# Patient Record
Sex: Female | Born: 1950 | Race: White | Hispanic: No | Marital: Married | Smoking: Never smoker
Health system: Southern US, Community
[De-identification: ages and names within clinical notes are randomized; demographics above are authoritative.]

## PROBLEM LIST (undated history)

## (undated) DIAGNOSIS — E785 Hyperlipidemia, unspecified: Secondary | ICD-10-CM

## (undated) DIAGNOSIS — N200 Calculus of kidney: Secondary | ICD-10-CM

## (undated) DIAGNOSIS — E119 Type 2 diabetes mellitus without complications: Secondary | ICD-10-CM

## (undated) DIAGNOSIS — I739 Peripheral vascular disease, unspecified: Secondary | ICD-10-CM

## (undated) DIAGNOSIS — I2510 Atherosclerotic heart disease of native coronary artery without angina pectoris: Secondary | ICD-10-CM

## (undated) DIAGNOSIS — I10 Essential (primary) hypertension: Secondary | ICD-10-CM

## (undated) DIAGNOSIS — I779 Disorder of arteries and arterioles, unspecified: Secondary | ICD-10-CM

## (undated) DIAGNOSIS — Z87898 Personal history of other specified conditions: Secondary | ICD-10-CM

## (undated) HISTORY — DX: Peripheral vascular disease, unspecified: I73.9

## (undated) HISTORY — DX: Personal history of other specified conditions: Z87.898

## (undated) HISTORY — DX: Hyperlipidemia, unspecified: E78.5

## (undated) HISTORY — DX: Atherosclerotic heart disease of native coronary artery without angina pectoris: I25.10

## (undated) HISTORY — DX: Disorder of arteries and arterioles, unspecified: I77.9

## (undated) HISTORY — PX: CHOLECYSTECTOMY: SHX55

## (undated) HISTORY — DX: Essential (primary) hypertension: I10

## (undated) HISTORY — DX: Calculus of kidney: N20.0

## (undated) HISTORY — PX: BREAST CYST ASPIRATION: SHX578

## (undated) HISTORY — PX: TONSILLECTOMY: SUR1361

---
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
## 2005-02-09 ENCOUNTER — Ambulatory Visit: Payer: Self-pay | Admitting: Family Medicine

## 2005-03-20 ENCOUNTER — Inpatient Hospital Stay (HOSPITAL_COMMUNITY): Admission: AD | Admit: 2005-03-20 | Discharge: 2005-03-29 | Payer: Self-pay | Admitting: *Deleted

## 2005-03-20 HISTORY — PX: CORONARY ARTERY BYPASS GRAFT: SHX141

## 2005-04-30 ENCOUNTER — Encounter: Admission: RE | Admit: 2005-04-30 | Discharge: 2005-04-30 | Payer: Self-pay | Admitting: Cardiothoracic Surgery

## 2005-08-17 ENCOUNTER — Encounter: Payer: Self-pay | Admitting: Unknown Physician Specialty

## 2006-03-04 ENCOUNTER — Ambulatory Visit: Payer: Self-pay | Admitting: Family Medicine

## 2007-03-10 ENCOUNTER — Ambulatory Visit: Payer: Self-pay | Admitting: Family Medicine

## 2007-07-11 ENCOUNTER — Ambulatory Visit: Payer: Self-pay | Admitting: General Surgery

## 2007-09-01 ENCOUNTER — Encounter (INDEPENDENT_AMBULATORY_CARE_PROVIDER_SITE_OTHER): Payer: Self-pay | Admitting: *Deleted

## 2007-09-01 LAB — CONVERTED CEMR LAB
Cholesterol: 161 mg/dL
HDL: 68 mg/dL
LDL (calc): 64 mg/dL
Triglyceride fasting, serum: 144 mg/dL

## 2007-12-26 LAB — HM COLONOSCOPY: HM Colonoscopy: NORMAL

## 2008-03-05 ENCOUNTER — Encounter (INDEPENDENT_AMBULATORY_CARE_PROVIDER_SITE_OTHER): Payer: Self-pay | Admitting: *Deleted

## 2008-03-05 LAB — CONVERTED CEMR LAB
Cholesterol: 148 mg/dL
HDL: 66 mg/dL
LDL (calc): 59 mg/dL
Triglyceride fasting, serum: 117 mg/dL

## 2008-03-13 ENCOUNTER — Ambulatory Visit: Payer: Self-pay | Admitting: Family Medicine

## 2008-07-31 ENCOUNTER — Ambulatory Visit: Payer: Self-pay | Admitting: Family Medicine

## 2008-08-20 ENCOUNTER — Encounter (INDEPENDENT_AMBULATORY_CARE_PROVIDER_SITE_OTHER): Payer: Self-pay | Admitting: *Deleted

## 2008-08-20 LAB — CONVERTED CEMR LAB
Cholesterol: 155 mg/dL
HDL: 74 mg/dL
LDL (calc): 59 mg/dL
Triglyceride fasting, serum: 112 mg/dL

## 2009-03-14 ENCOUNTER — Ambulatory Visit: Payer: Self-pay | Admitting: Family Medicine

## 2009-10-18 ENCOUNTER — Ambulatory Visit: Payer: Self-pay | Admitting: Family Medicine

## 2009-10-30 ENCOUNTER — Encounter (INDEPENDENT_AMBULATORY_CARE_PROVIDER_SITE_OTHER): Payer: Self-pay | Admitting: *Deleted

## 2009-12-19 ENCOUNTER — Ambulatory Visit: Payer: Self-pay | Admitting: Cardiovascular Disease

## 2009-12-19 DIAGNOSIS — I25118 Atherosclerotic heart disease of native coronary artery with other forms of angina pectoris: Secondary | ICD-10-CM

## 2009-12-19 DIAGNOSIS — E785 Hyperlipidemia, unspecified: Secondary | ICD-10-CM | POA: Insufficient documentation

## 2009-12-23 ENCOUNTER — Encounter: Payer: Self-pay | Admitting: Cardiovascular Disease

## 2010-05-07 ENCOUNTER — Ambulatory Visit: Payer: Self-pay | Admitting: Family Medicine

## 2010-08-19 NOTE — Assessment & Plan Note (Signed)
Summary: NP6/AMD   Visit Type:  Initial Consult Primary Provider:  Lorie Phenix  CC:  no complaints.  History of Present Illness: Dana Ramirez is a very pleasant 60 year old woman with a history of coronary artery disease, bypass surgery in September 2006 with a LIMA to the LAD, vein graft to the diagonal, negative stress test in December 2009, moderate left carotid arterial disease,  who presented to establish care. She was last seen by myself at [facility] every 2010.  She states that she had a stress test and carotid ultrasound done in 2010 and will try to obtain these records. Will also try to obtain the most recent lipid panel from Dr. Elease Hashimoto.  She states that she is doing well, has no symptoms of shortness of breath or chest discomfort. She is trying to work on her weight and her diabetes. Her last hemoglobin A1c per her report was 6.75. She has been taking Crestor 20 mg alternating with 10 mg.  Cardiac catheterization done September 2006 showed a diffusely diseased LAD from the left main to the midportion with lesion up to 90-95%, 95% ostial diagonal disease.  Preventive Screening-Counseling & Management  Alcohol-Tobacco     Smoking Status: never  Caffeine-Diet-Exercise     Does Patient Exercise: yes      Drug Use:  no.    Current Medications (verified): 1)  Crestor 20 Mg Tabs (Rosuvastatin Calcium) .... Take One Tablet By Mouth Every Other Day Alternated One Half Tablet Every Other Day 2)  Aspir-Low 81 Mg Tbec (Aspirin) .... Take 2 Tablet By Mouth Once Daily 3)  Multivitamins  Tabs (Multiple Vitamin) 4)  Metoprolol Succinate 25 Mg Xr24h-Tab (Metoprolol Succinate) .... Take 1 Tablet By Mouth Once Daily 5)  Cozaar 50 Mg Tabs (Losartan Potassium) .... Take One Tablet By Mouth Daily  Allergies (verified): 1)  ! Codeine 2)  ! Pcn  Social History: Full Time--[employer] Married  Tobacco Use - No.  Alcohol Use - no Regular Exercise - yes--once or twice q  week Drug Use - no Smoking Status:  never Does Patient Exercise:  yes Drug Use:  no  Review of Systems       The patient complains of weight gain.  The patient denies fever, weight loss, vision loss, decreased hearing, hoarseness, chest pain, syncope, dyspnea on exertion, peripheral edema, prolonged cough, abdominal pain, incontinence, muscle weakness, depression, and enlarged lymph nodes.    Vital Signs:  Patient profile:   60 year old female Height:      62 inches Weight:      176 pounds BMI:     32.31 Pulse rate:   76 / minute BP sitting:   118 / 81  (right arm) Cuff size:   regular  Vitals Entered By: Bishop Dublin, CMA (December 19, 2009 2:41 PM)  Physical Exam  General:  Well developed, well nourished, in no acute distress. Head:  normocephalic and atraumatic Neck:  Neck supple, no JVD. No masses, thyromegaly or abnormal cervical nodes. Lungs:  Clear bilaterally to auscultation and percussion. Heart:  Non-displaced PMI, chest non-tender; regular rate and rhythm, S1, S2 without murmurs, rubs or gallops. Carotid upstroke normal, no bruit. . Pedals normal pulses. No edema, no varicosities. Abdomen:  Bowel sounds positive; abdomen soft and non-tender without masses, organomegaly, or hernias noted. No hepatosplenomegaly. Msk:  Back normal, normal gait. Muscle strength and tone normal. Pulses:  pulses normal in all 4 extremities Extremities:  No clubbing or cyanosis. Neurologic:  Alert and oriented x 3. Skin:  Intact without lesions or rashes. Psych:  Normal affect.    EKG  Procedure date:  12/19/2009  Findings:      normal sinus rhythm with rate 75 beats per minute, no significant ST or T wave changes.  Impression & Recommendations:  Problem # 1:  CAD, NATIVE VESSEL (ICD-414.01) history of bypass in 2006. We'll try to obtain the most recent stress test from [facility] for our records. No symptoms of angina.  The following medications were removed from the medication  list:    Nitrostat 0.4 Mg Subl (Nitroglycerin) .Marland Kitchen... Take 1 tablet by mouth as needed Her updated medication list for this problem includes:    Aspir-low 81 Mg Tbec (Aspirin) .Marland Kitchen... Take 2 tablet by mouth once daily    Metoprolol Succinate 25 Mg Xr24h-tab (Metoprolol succinate) .Marland Kitchen... Take 1 tablet by mouth once daily  Problem # 2:  HYPERLIPIDEMIA-MIXED (ICD-272.4) We will try to obtain the most recent lipid panel from Dr. Elease Hashimoto  Before making any recommendations. We have encouraged her to continue to loose weight as this will help her lipid panel.  The following medications were removed from the medication list:    Lovaza 1 Gm Caps (Omega-3-acid ethyl esters) .Marland Kitchen... Take 2  tablet by mouth once daily Her updated medication list for this problem includes:    Crestor 20 Mg Tabs (Rosuvastatin calcium) .Marland Kitchen... Take one tablet by mouth every other day alternated one half tablet every other day  Problem # 3:  CAROTID ARTERY STENOSIS, WITHOUT INFARCTION (ICD-433.10) she does have a history of moderate left carotid arterial disease. We'll try to obtain a copy of the most recent carotid ultrasound done last year.  Her updated medication list for this problem includes:    Aspir-low 81 Mg Tbec (Aspirin) .Marland Kitchen... Take 2 tablet by mouth once daily  Patient Instructions: 1)  Your physician has recommended you make the following change in your medication: Increase aspirin 81mg  to 2 tablets a day  2)  Your physician wants you to follow-up in:   6 MONTHS You will receive a reminder letter in the mail two months in advance. If you don't receive a letter, please call our office to schedule the follow-up appointment.

## 2010-08-19 NOTE — Letter (Signed)
Summary: MR RELEASE  MR RELEASE   Imported By: Frazier Butt Chriscoe 12/23/2009 09:49:32  _____________________________________________________________________  External Attachment:    Type:   Image     Comment:   External Document

## 2010-08-19 NOTE — Letter (Signed)
Summary: HEALTH HX  HEALTH HX   Imported ByFrazier Butt Chriscoe 12/23/2009 09:24:38  _____________________________________________________________________  External Attachment:    Type:   Image     Comment:   External Document

## 2010-08-19 NOTE — Letter (Signed)
Summary: MR RELEASE  MR RELEASE   Imported By: Frazier Butt Chriscoe 12/23/2009 09:46:56  _____________________________________________________________________  External Attachment:    Type:   Image     Comment:   External Document

## 2010-12-05 NOTE — Discharge Summary (Signed)
Dana Ramirez, Dana Ramirez NO.:  [id number]   MEDICAL RECORD NO.:  [id number]          PATIENT TYPE:  INP   LOCATION:  2008                         FACILITY:  [facility]   PHYSICIAN:  Sheliah Plane, MD    DATE OF BIRTH:  [birth year]   DATE OF ADMISSION:  03/20/2005  DATE OF DISCHARGE:  03/29/2005                                 DISCHARGE SUMMARY   HISTORY OF PRESENT ILLNESS:  The patient is a 60 year old female who was  seen in consultation by Dr. Jenne Campus.  She is a patient of Dr. Elease Hashimoto in  [place] and has a history of borderline hypertension, as well as a  history of hyperlipidemia who over the last several months has been  complaining of exertional chest pain.  She noted this primarily with walking  uphill.  She would develop the substernal chest pain, as well as some mild  shortness of breath.  When she would stop, the symptoms would slowly  resolve.  She was subsequently to [facility] where she underwent a  stress echocardiogram interpreted by Dr. Ann Maki which showed septal  ischemia.  She had an EKG suggestive of ischemia, as well.  She was put on  an aspirin and instructed to begin Toprol.  She came to the [facility] for a second opinion with Dr. Jenne Campus.  Dr. Jenne Campus and I  evaluated the patient and he suggested that she undergo an elective cardiac  catheterization and then she was admitted this hospitalization for the  procedure.   ADMISSION MEDICATIONS:  1.  Aspirin 325 mg daily.  2.  Multivitamin daily.   FAMILY HISTORY, SOCIAL HISTORY, REVIEW OF SYSTEMS AND PHYSICAL EXAMINATION:  Please see the history and physical done at the time of admission.   ALLERGIES:  The patient has a sensitivity to PENICILLIN.   HOSPITAL COURSE:  The patient was admitted for cardiac catheterization and  this was performed by Dr. Jenne Campus on 03/20/05.  She was found to have  significant LAD disease with a 60% proximal, a 95% mid and a 50% distal  lesion.  The diagonal had a 95% ostial lesion.  The left main had a 30%  ostial lesion which was dampened with catheter.  The left circumflex was  normal and the right coronary artery was normal.  The patient was  subsequently referred to Dr. Sheliah Plane for surgical opinion.  He  evaluated the patient and studies and recommended proceeding with surgical  revascularization.  Of note, during her preoperative workup she was found to  have a moderate left carotid artery stenosis of 40% to 60% on duplex which  was asymptomatic.   PROCEDURE:  On 03/24/05, the patient was taken to the operating room at which  time she underwent the following procedure:  Off pump coronary artery bypass  grafting x2.  The following grafts were placed:  1.  Left internal mammary artery to the LAD.  2.  Saphenous vein graft to the diagonal coronary artery.  The patient tolerated the procedure well and was taken to the Surgical  Intensive Care unit in stable condition.  POSTOPERATIVE HOSPITAL COURSE:  The patient has done quite well.  She did  have some minor difficulties with postoperative nausea and constipation, but  this resolved with time and she fully resumed a regular diet.  Hemodynamically, she remained quite stable.  She did have a postoperative  anemia, but was transfused and has stabilized nicely.  Her most recent  hemoglobin and hematocrit dated 03/27/05 was 12.6 and 35.7, respectively.  Electrolytes:  BUN and creatinine are all within normal limits with the  exception of a slightly elevated non-fasting glucose.  All routine lines,  monitors and drainage devices have been discontinued in the standard  fashion.  Incisions are healing well with some moderate ecchymosis of the  right thigh, but no evidence of hematoma or infection.  She has increased  her activity commensurate to a level of postoperative convalescence using  routine cardiac rehabilitation Phase 1 modalities.  She remains in normal  sinus  rhythm and her overall status is felt to be quite stable for discharge  on 03/29/05.   DISCHARGE MEDICATIONS:  1.  Lipitor 20 mg daily.  2.  Aspirin 81 mg daily.  3.  Folic acid 1 mg daily.  4.  Lopressor 25 mg b.i.d.  5.  Ultram 50 to 100 mg every four hours-6h. p.r.n. as needed for pain.   DISCHARGE INSTRUCTIONS:  The patient received written instructions in regard  to medications, activity, diet, wound care and follow up.  Follow up  included Dr. Tyrone Sage in 3 weeks, Dr. Jenne Campus in 2 weeks.   FINAL DIAGNOSIS:  1.  Severe coronary artery disease, as described above, now status post      surgical revascularization.  2.  Postoperative anemia, status post transfusion with normal laboratory      parameters currently.  3.  Hypertension.  4.  Hyperlipidemia.  5.  Normal left ventricular function.  6.  Left internal carotid artery moderate stenosis.   CONDITION OF PATIENT AT DISCHARGE:  Stable and improved.      Rowe Clack, P.A.-C.      Sheliah Plane, MD  Electronically Signed    WEG/MEDQ  D:  03/29/2005  T:  03/29/2005  Job:  [id number]   cc:   Sheliah Plane, MD  [address]  [place]  [place] [address]   Darlin Priestly, MD  Fax: [phone]   Ann Maki, Dr.  [facility]   [place]  Fax: [phone]

## 2010-12-05 NOTE — Op Note (Signed)
Dana Ramirez, BATTE NO.:  [id number]   MEDICAL RECORD NO.:  [id number]          PATIENT TYPE:  INP   LOCATION:  2313                         FACILITY:  [facility]   PHYSICIAN:  Sheliah Plane, MD    DATE OF BIRTH:  04-Nov-1950   DATE OF PROCEDURE:  03/24/2005  DATE OF DISCHARGE:                                 OPERATIVE REPORT   PREOPERATIVE DIAGNOSIS:  Coronary occlusive disease with new onset of  angina.   POSTOPERATIVE DIAGNOSIS:  Coronary occlusive disease with new onset angina.   SURGICAL PROCEDURE:  Off pump coronary artery bypass grafting x 2 with left  internal mammary to the left anterior descending coronary artery, reversed  saphenous vein graft to the diagonal coronary artery with right thigh  endovein harvesting.   SURGEON:  Gwenith Daily. Tyrone Sage, M.D.   FIRST ASSISTANT:  Salvatore Decent. Dorris Fetch, M.D.   SECOND ASSISTANT:  Rowe Clack, P.A.-C.   BRIEF HISTORY:  The patient is a 60 year old female with a very strong  positive family history of coronary occlusive disease who presents with new  onset of angina.  He was evaluated by Dr. Lenise Herald including cardiac  catheterization which demonstrated high grade complex LAD diagonal lesion  involving the LAD and first diagonal.  The right coronary artery was free of  disease.  The circumflex was free of disease.  Overall ventricular function  was preserved.  Because of the complex nature of the patient's lesion,  coronary artery bypass grafting was recommended.  The patient agreed and  signed informed consent.   DESCRIPTION OF PROCEDURE:  With Swan-Ganz and arterial line monitors in  place, the patient underwent general endotracheal anesthesia without  incident.  The skin of the chest and legs were prepped with Betadine and  draped in the usual sterile manner.  Using the Guidant endovein harvesting  system, a segment of vein was harvested from the right thigh, was good  quality and caliber.  A median  sternotomy was performed and the left  internal mammary artery was dissected down as a pedicle graft.  The distal  artery was divided and had good free flow.  The vessel was hydrostatically  dilated with heparinized saline.  The pericardium was opened.  Overall  ventricular function appeared preserved.  The patient was systemically  heparinized.  The first diagonal and LAD were both epicardial vessels.  With  the Guidant stabilization system, the apex of the heart was elevated from  the chest slightly.  Vessel loops were placed proximally and distally around  the proximal diagonal vessel to control the vessel.  The second arm of the  stabilizer was also placed.  The vessel was opened and using a running 7-0  Prolene, the distal segment of vein was anastomosed to the first diagonal.  The retractors were then removed and the partial occlusion clamp was placed  on the ascending aorta.  A single punch aortotomy was performed and the vein  graft was anastomosed to the ascending aorta.  Attention was then turned to  the left anterior descending  coronary artery.  In a similar fashion,  this  vessel was bypassed using vessel loops proximally and distally to control  the vessel.  Intraluminal shunts were not used.  Using a running 8-0  Prolene, the left internal mammary artery was anastomosed to the left  anterior descending coronary artery.  The bulldog on the mammary artery was  removed.  The fascia was tacked to the epicardium.  The patient remained  hemodynamically stable throughout this.  Two atrial and two ventricular  pacing wires were applied.  Graft marker was applied.  Protamine sulfate was  administered.  With the operative field hemostatic, the pericardium was  reapproximated.  A left pleural tube, two mediastinal tubes, and a single  anterior mediastinal tube was left in place.  The sternum was closed with #6  stainless steel wire, fascia was closed with interrupted 0 Vicryl, running  3-  0 Vicryl in the subcutaneous tissue, and 4-0 subcuticular stitch in the skin  edges.  Dry dressings were applied.  Sponge and needle counts were reported  as correct at the completion of the procedure.  The patient tolerated the  procedure without obvious complications and was transferred to the surgical  intensive care unit for further postoperative care.      Sheliah Plane, MD  Electronically Signed     EG/MEDQ  D:  03/25/2005  T:  03/25/2005  Job:  [id number]   cc:   Darlin Priestly, MD  Fax: [phone]

## 2010-12-05 NOTE — Cardiovascular Report (Signed)
NAMECHRISHONDA, Dana Ramirez              ACCOUNT NO.:  [id number]   MEDICAL RECORD NO.:  [id number]          PATIENT TYPE:  OIB   LOCATION:  2899                         FACILITY:  [facility]   PHYSICIAN:  Darlin Priestly, MD  DATE OF BIRTH:  1951-05-15   DATE OF PROCEDURE:  03/20/2005  DATE OF DISCHARGE:                              CARDIAC CATHETERIZATION   PROCEDURES:  1.  Left heart catheterization.  2.  Coronary angiography.  3.  Left ventriculogram.   SURGEON:  Darlin Priestly, M.D.   COMPLICATIONS:  None.   INDICATIONS FOR PROCEDURE:  [name] is a 60 year old female patient of Dr.  Lorie Phenix [place], with a history of borderline  hypertension, hyperlipidemia who recently has been complaining of increasing  exertional chest pain.  She underwent a stress echocardiogram  in [facility] in [place], suggesting anteroseptal ischemia with  positive EKG and normal LV function.  She is now referred for cardiac  catheterization to rule out significant CAD.   DESCRIPTION OF PROCEDURE:  After given informed written consent, the patient  was brought to the cardiac catheterization.  Right and left groin was  shaved, prepped and draped in the usual sterile fashion.  ECG monitor was  established.  Using modified Seldinger technique, a #4 French venous sheath  inserted in the right femoral vein and a #6 [place] arterial sheath in the  right femoral artery.  Next, a #5 [place] JL4 3.5 guiding catheter used to  engage the left main.  The left main appeared to be a medium size vessel  with 30% proximal narrowing.  There was pressure damping when engaged with a  5 [place] catheter.   LAD is a medium vessel which coursed the apex.  There were two diagonal  branches.  The LAD is noted to be diffusely diseased from the left main into  the mid portion up to 60% proximal.  This became approximately 90 to 95% in  the distal proximal and early mid portion  of the LAD involving takeoff of  the large diagonal branch.  The mid LAD had mild 50% narrowing but no  further significant disease in the LAD.   The first diagonal is a medium to large size vessel which bifurcates  distally with 95% ostial lesion.  Second diagonal is a medium size vessel  with no significant disease.   Left circumflex was a large vessel coursing the AV groove and gives rise to  three obtuse marginal branches.  The AV groove circumflex has no significant  disease.   First OM is a medium size vessel with no significant disease.  Second and  third OM were medium to large size vessels with no significant disease.   The right coronary artery is a small to medium size vessel which also damps  when engaged with a 6 [place] catheter.  There does not appear to be any  significant disease of the RCA, PDA or posterolateral branch.   Left ventriculogram has a preserved EF of 60%.   HEMODYNAMICS:  Systemic arterial pressure 124/55, LV systolic pressure 123/7.  LVEDP is 25.   CONCLUSION:  1.  Significant one vessel coronary artery disease.  2.  Normal left ventricular systolic function.  3.  Elevated left ventricular end diastolic pressure.      Darlin Priestly, MD  Electronically Signed     RHM/MEDQ  D:  03/20/2005  T:  03/20/2005  Job:  [id number]   cc:   Lorie Phenix  [address]  [place]  [place] [address]  Fax: [phone]

## 2010-12-08 ENCOUNTER — Other Ambulatory Visit: Payer: Self-pay

## 2010-12-08 MED ORDER — ROSUVASTATIN CALCIUM 20 MG PO TABS: ORAL_TABLET | ORAL | Status: DC

## 2010-12-08 NOTE — Telephone Encounter (Signed)
Needs a refill on crestor 20 mg take one tablet by mouth every other day alternated one half tablet every other day.

## 2011-01-01 ENCOUNTER — Encounter: Payer: Self-pay | Admitting: Cardiovascular Disease

## 2011-01-08 ENCOUNTER — Other Ambulatory Visit (INDEPENDENT_AMBULATORY_CARE_PROVIDER_SITE_OTHER): Payer: BC Managed Care – PPO | Admitting: *Deleted

## 2011-01-08 DIAGNOSIS — E785 Hyperlipidemia, unspecified: Secondary | ICD-10-CM

## 2011-01-09 LAB — HEPATIC FUNCTION PANEL
ALT: 31 U/L (ref 0–35)
AST: 31 U/L (ref 0–37)
Albumin: 4.3 g/dL (ref 3.5–5.2)
Alkaline Phosphatase: 72 U/L (ref 39–117)
Bilirubin, Direct: 0.1 mg/dL (ref 0.0–0.3)
Indirect Bilirubin: 0.4 mg/dL (ref 0.0–0.9)
Total Bilirubin: 0.5 mg/dL (ref 0.3–1.2)
Total Protein: 6.2 g/dL (ref 6.0–8.3)

## 2011-01-09 LAB — LIPID PANEL
Cholesterol: 132 mg/dL (ref 0–200)
HDL: 61 mg/dL (ref >39)
LDL Cholesterol: 51 mg/dL (ref 0–99)
Total CHOL/HDL Ratio: 2.2 Ratio
Triglycerides: 98 mg/dL (ref <150)
VLDL: 20 mg/dL (ref 0–40)

## 2011-01-12 ENCOUNTER — Ambulatory Visit: Payer: Self-pay | Admitting: Cardiovascular Disease

## 2011-01-21 ENCOUNTER — Encounter: Payer: Self-pay | Admitting: Cardiovascular Disease

## 2011-01-23 ENCOUNTER — Ambulatory Visit: Payer: Self-pay | Admitting: Cardiovascular Disease

## 2011-01-26 ENCOUNTER — Ambulatory Visit (INDEPENDENT_AMBULATORY_CARE_PROVIDER_SITE_OTHER): Payer: BC Managed Care – PPO | Admitting: Cardiovascular Disease

## 2011-01-26 ENCOUNTER — Ambulatory Visit: Payer: Self-pay | Admitting: Cardiovascular Disease

## 2011-01-26 ENCOUNTER — Encounter: Payer: Self-pay | Admitting: Cardiovascular Disease

## 2011-01-26 DIAGNOSIS — I2510 Atherosclerotic heart disease of native coronary artery without angina pectoris: Secondary | ICD-10-CM

## 2011-01-26 DIAGNOSIS — E785 Hyperlipidemia, unspecified: Secondary | ICD-10-CM

## 2011-01-26 DIAGNOSIS — I6529 Occlusion and stenosis of unspecified carotid artery: Secondary | ICD-10-CM

## 2011-01-26 NOTE — Assessment & Plan Note (Signed)
Cholesterol is at goal on the current lipid regimen. No changes to the medications were made.  

## 2011-01-26 NOTE — Assessment & Plan Note (Signed)
Will repeat carotid ultrasound next year. Cholesterol management is excellent. No bruit on auscultation.

## 2011-01-26 NOTE — Progress Notes (Signed)
Patient ID: Dana Ramirez, female    DOB: 1950-08-29, 60 y.o.   MRN: [id number]  HPI Comments: Ms. Hight is a very pleasant 60 year old woman with a history of coronary artery disease, bypass surgery in September 2006 with a LIMA to the LAD, vein graft to the diagonal, negative stress test in 2010, moderate left carotid arterial disease 2010,  who presents for routine followup.   She reports that she is doing well. [place] She denies any significant chest pain with exertion. Occasionally, she has some chest discomfort after eating, sometimes when she has tomatoes. She thinks this is reflux. She occasionally has burping and relieves her discomfort. Otherwise she feels well with no new complaints. She is trying to work on her weight and her diabetes. Her last hemoglobin A1c per her report was 6.75. She has been taking Crestor 20 mg alternating with 10 mg.   Cardiac catheterization done September 2006 showed a diffusely diseased LAD from the left main to the midportion with lesion up to 90-95%, 95% ostial diagonal disease.   EKG shows normal sinus rhythm with rate 62 beats a minute with no significant ST or T wave changes.   Outpatient Encounter Prescriptions as of 01/26/2011  Medication Sig Dispense Refill  . aspirin (ASPIR-LOW) 81 MG EC tablet Take 162 mg by mouth daily.        . calcium carbonate 200 MG capsule Take 1 capsule by mouth daily.        . Cholecalciferol (VITAMIN D3) 3000 UNITS TABS Take 2,000 Units by mouth daily.        Marland Kitchen losartan (COZAAR) 50 MG tablet Take 50 mg by mouth daily.        . metoprolol succinate (TOPROL-XL) 25 MG 24 hr tablet Take 25 mg by mouth daily.        . Multiple Vitamin (MULTIVITAMIN) tablet Take by mouth.        . rosuvastatin (CRESTOR) 20 MG tablet Take one tablet by mouth every other day alternated one half tablet every other day.  30 tablet  6     Review of Systems  Constitutional: Negative.   HENT:  Negative.   Eyes: Negative.   Respiratory: Negative.   Cardiovascular: Positive for chest pain.  Gastrointestinal: Negative.   Musculoskeletal: Negative.   Skin: Negative.   Neurological: Negative.   Hematological: Negative.   Psychiatric/Behavioral: Negative.   All other systems reviewed and are negative.    BP 132/81  Pulse 62  Ht 5\' 4"  (1.626 m)  Wt 177 lb 1.9 oz (80.341 kg)  BMI 30.40 kg/m2  Physical Exam  Nursing note and vitals reviewed. Constitutional: She is oriented to person, place, and time. She appears well-developed and well-nourished.  HENT:  Head: Normocephalic.  Nose: Nose normal.  Mouth/Throat: Oropharynx is clear and moist.  Eyes: Conjunctivae are normal. Pupils are equal, round, and reactive to light.  Neck: Normal range of motion. Neck supple. No JVD present.  Cardiovascular: Normal rate, regular rhythm, S1 normal, S2 normal, normal heart sounds and intact distal pulses.  Exam reveals no gallop and no friction rub.   No murmur heard. Pulmonary/Chest: Effort normal and breath sounds normal. No respiratory distress. She has no wheezes. She has no rales. She exhibits no tenderness.  Abdominal: Soft. Bowel sounds are normal. She exhibits no distension. There is no tenderness.  Musculoskeletal: Normal range of motion. She exhibits no edema and no tenderness.  Lymphadenopathy:  She has no cervical adenopathy.  Neurological: She is alert and oriented to person, place, and time. Coordination normal.  Skin: Skin is warm and dry. No rash noted. No erythema.  Psychiatric: She has a normal mood and affect. Her behavior is normal. Judgment and thought content normal.         Assessment and Plan

## 2011-01-26 NOTE — Assessment & Plan Note (Signed)
Currently with no symptoms of angina. No further workup at this time. Continue current medication regimen. 

## 2011-01-26 NOTE — Patient Instructions (Signed)
You are doing well. No medication changes were made. Please call us if you have new issues that need to be addressed before your next appt.  We will call you for a follow up Appt. In 6 months  

## 2011-03-04 ENCOUNTER — Telehealth: Payer: Self-pay

## 2011-03-04 MED ORDER — METOPROLOL SUCCINATE ER 25 MG PO TB24: 25.0000 mg | ORAL_TABLET | Freq: Every day | ORAL | Status: DC

## 2011-03-04 MED ORDER — LOSARTAN POTASSIUM 50 MG PO TABS: 50.0000 mg | ORAL_TABLET | Freq: Every day | ORAL | Status: DC

## 2011-03-04 NOTE — Telephone Encounter (Signed)
Requested a refill for losartan potassium 50 mg take one tablet daily.

## 2011-03-04 NOTE — Telephone Encounter (Signed)
Needs a refill for metoprolol.

## 2011-04-14 ENCOUNTER — Telehealth: Payer: Self-pay

## 2011-04-14 MED ORDER — LOSARTAN POTASSIUM 50 MG PO TABS: 50.0000 mg | ORAL_TABLET | Freq: Every day | ORAL | Status: DC

## 2011-04-14 MED ORDER — METOPROLOL SUCCINATE ER 25 MG PO TB24: 25.0000 mg | ORAL_TABLET | Freq: Every day | ORAL | Status: DC

## 2011-04-14 NOTE — Telephone Encounter (Signed)
Refill metoprolol succ er 25 mg.

## 2011-04-14 NOTE — Telephone Encounter (Signed)
Refill sent for losartan potassium 50 mg take one tablet daily.

## 2011-07-09 ENCOUNTER — Other Ambulatory Visit: Payer: Self-pay | Admitting: Cardiovascular Disease

## 2011-07-09 NOTE — Telephone Encounter (Signed)
Refill sent for crestor.  

## 2011-08-11 ENCOUNTER — Ambulatory Visit: Payer: Self-pay | Admitting: Family Medicine

## 2011-10-15 ENCOUNTER — Other Ambulatory Visit: Payer: Self-pay

## 2011-10-15 MED ORDER — METOPROLOL SUCCINATE ER 25 MG PO TB24: 25.0000 mg | ORAL_TABLET | Freq: Every day | ORAL | Status: DC

## 2011-10-15 MED ORDER — LOSARTAN POTASSIUM 50 MG PO TABS: 50.0000 mg | ORAL_TABLET | Freq: Every day | ORAL | Status: DC

## 2011-10-15 NOTE — Telephone Encounter (Signed)
Refill sent for losartan potassium 50 mg take one tablet daily. 

## 2011-10-15 NOTE — Telephone Encounter (Signed)
Refill sent for metoprolol succ Er 25 mg take one tablet daily.

## 2011-10-20 ENCOUNTER — Ambulatory Visit (INDEPENDENT_AMBULATORY_CARE_PROVIDER_SITE_OTHER): Payer: BC Managed Care – PPO | Admitting: Cardiovascular Disease

## 2011-10-20 ENCOUNTER — Encounter: Payer: Self-pay | Admitting: Cardiovascular Disease

## 2011-10-20 VITALS — BP 110/70 | HR 62 | Ht 63.0 in | Wt 161.0 lb

## 2011-10-20 DIAGNOSIS — I2510 Atherosclerotic heart disease of native coronary artery without angina pectoris: Secondary | ICD-10-CM

## 2011-10-20 DIAGNOSIS — E119 Type 2 diabetes mellitus without complications: Secondary | ICD-10-CM

## 2011-10-20 DIAGNOSIS — I6529 Occlusion and stenosis of unspecified carotid artery: Secondary | ICD-10-CM

## 2011-10-20 DIAGNOSIS — E785 Hyperlipidemia, unspecified: Secondary | ICD-10-CM

## 2011-10-20 MED ORDER — METFORMIN HCL 500 MG PO TABS: 500.0000 mg | ORAL_TABLET | Freq: Two times a day (BID) | ORAL | Status: DC

## 2011-10-20 NOTE — Assessment & Plan Note (Signed)
Cholesterol is at goal on the current lipid regimen. No changes to the medications were made.  

## 2011-10-20 NOTE — Assessment & Plan Note (Signed)
Moderate carotid disease in 2010. We will schedule her for repeat carotid ultrasound at her convenience in our office in [place]

## 2011-10-20 NOTE — Assessment & Plan Note (Signed)
Currently with no symptoms of angina. No further workup at this time. Continue current medication regimen. 

## 2011-10-20 NOTE — Patient Instructions (Signed)
You are doing well. Please start  Metformin one in the AM. Monitor your sugars if possible  Please call us if you have new issues that need to be addressed before your next appt.  Your physician wants you to follow-up in: 6 months.  You will receive a reminder letter in the mail two months in advance. If you don't receive a letter, please call our office to schedule the follow-up appointment.

## 2011-10-20 NOTE — Assessment & Plan Note (Signed)
Recent hemoglobin A1c 8.3. We will start metformin 500 mg daily. I've asked her to monitor her sugars on a regular basis and if they continue to be elevated, she should increase the metformin to 500 mg twice a day. She has close followup with Dr. Elease Hashimoto in several weeks.

## 2011-10-20 NOTE — Progress Notes (Signed)
Patient ID: Dana Ramirez, female    DOB: Dec 19, 1950, 61 y.o.   MRN: [id number]  HPI Comments: Dana Ramirez is a very pleasant 61 year old woman with a history of coronary artery disease, bypass surgery in September 2006 with a LIMA to the LAD, vein graft to the diagonal, negative stress test in 2010, moderate left carotid arterial disease 2010,  who presents for routine followup.   She reports that she is doing well. She has been active,  denies any significant chest pain with exertion.   she feels well with no new complaints. She is trying to work on her weight and her diabetes. Her last hemoglobin A1c was more than 8. She has been taking Crestor 20 mg alternating with 10 mg.  Her weight is down 16 pounds from her last clinic visit. She has had a very stressful several months. Mother has been sick and had a stroke. The patient has had some family stressors including shingles.   Cardiac catheterization done September 2006 showed a diffusely diseased LAD from the left main to the midportion with lesion up to 90-95%, 95% ostial diagonal disease.   EKG shows normal sinus rhythm with rate 62 beats a minute with no significant ST or T wave changes.   Outpatient Encounter Prescriptions as of 10/20/2011  Medication Sig Dispense Refill  . aspirin (ASPIR-LOW) 81 MG EC tablet Take 162 mg by mouth daily.        . calcium carbonate 200 MG capsule Take 1 capsule by mouth daily.        . Cholecalciferol (VITAMIN D3) 3000 UNITS TABS Take 2,000 Units by mouth daily.        . CRESTOR 20 MG tablet TAKE ONE TABLET BY MOUTH EVERY OTHER DAY ALTERNATED ONE HALF TABLET EVERY OTHER DAY.  30 tablet  6  . losartan (COZAAR) 50 MG tablet Take 1 tablet (50 mg total) by mouth daily.  30 tablet  6  . metoprolol succinate (TOPROL-XL) 25 MG 24 hr tablet Take 1 tablet (25 mg total) by mouth daily.  30 tablet  6  . Multiple Vitamin (MULTIVITAMIN) tablet Take by mouth.          Review of Systems  Constitutional: Negative.     HENT: Negative.   Eyes: Negative.   Respiratory: Negative.   Gastrointestinal: Negative.   Musculoskeletal: Negative.   Skin: Negative.   Neurological: Negative.   Hematological: Negative.   Psychiatric/Behavioral: Negative.   All other systems reviewed and are negative.    BP 110/70  Pulse 62  Ht 5\' 3"  (1.6 m)  Wt 161 lb (73.029 kg)  BMI 28.52 kg/m2  Physical Exam  Nursing note and vitals reviewed. Constitutional: She is oriented to person, place, and time. She appears well-developed and well-nourished.  HENT:  Head: Normocephalic.  Nose: Nose normal.  Mouth/Throat: Oropharynx is clear and moist.  Eyes: Conjunctivae are normal. Pupils are equal, round, and reactive to light.  Neck: Normal range of motion. Neck supple. No JVD present.  Cardiovascular: Normal rate, regular rhythm, S1 normal, S2 normal, normal heart sounds and intact distal pulses.  Exam reveals no gallop and no friction rub.   No murmur heard. Pulmonary/Chest: Effort normal and breath sounds normal. No respiratory distress. She has no wheezes. She has no rales. She exhibits no tenderness.  Abdominal: Soft. Bowel sounds are normal. She exhibits no distension. There is no tenderness.  Musculoskeletal: Normal range of motion. She exhibits no edema and no tenderness.  Lymphadenopathy:  She has no cervical adenopathy.  Neurological: She is alert and oriented to person, place, and time. Coordination normal.  Skin: Skin is warm and dry. No rash noted. No erythema.  Psychiatric: She has a normal mood and affect. Her behavior is normal. Judgment and thought content normal.         Assessment and Plan

## 2011-12-17 ENCOUNTER — Encounter (INDEPENDENT_AMBULATORY_CARE_PROVIDER_SITE_OTHER): Payer: BC Managed Care – PPO

## 2011-12-17 DIAGNOSIS — I6529 Occlusion and stenosis of unspecified carotid artery: Secondary | ICD-10-CM

## 2012-05-23 ENCOUNTER — Other Ambulatory Visit: Payer: Self-pay | Admitting: Cardiovascular Disease

## 2012-05-23 NOTE — Telephone Encounter (Signed)
Refilled Losartan and Metoprolol.

## 2012-06-25 ENCOUNTER — Other Ambulatory Visit: Payer: Self-pay | Admitting: Cardiovascular Disease

## 2012-10-31 ENCOUNTER — Ambulatory Visit: Payer: Self-pay | Admitting: Family Medicine

## 2012-11-12 ENCOUNTER — Other Ambulatory Visit: Payer: Self-pay | Admitting: Cardiovascular Disease

## 2012-11-14 ENCOUNTER — Other Ambulatory Visit: Payer: Self-pay | Admitting: *Deleted

## 2012-11-14 MED ORDER — METFORMIN HCL 500 MG PO TABS: 500.0000 mg | ORAL_TABLET | Freq: Two times a day (BID) | ORAL | Status: DC

## 2012-11-14 NOTE — Telephone Encounter (Signed)
Refilled Metformin sent to [facility].

## 2012-12-21 ENCOUNTER — Encounter (INDEPENDENT_AMBULATORY_CARE_PROVIDER_SITE_OTHER): Payer: BC Managed Care – PPO

## 2012-12-21 ENCOUNTER — Ambulatory Visit (INDEPENDENT_AMBULATORY_CARE_PROVIDER_SITE_OTHER): Payer: BC Managed Care – PPO | Admitting: Cardiovascular Disease

## 2012-12-21 ENCOUNTER — Encounter: Payer: Self-pay | Admitting: Cardiovascular Disease

## 2012-12-21 VITALS — BP 98/68 | HR 70 | Ht 63.0 in | Wt 163.8 lb

## 2012-12-21 DIAGNOSIS — E785 Hyperlipidemia, unspecified: Secondary | ICD-10-CM

## 2012-12-21 DIAGNOSIS — I951 Orthostatic hypotension: Secondary | ICD-10-CM

## 2012-12-21 DIAGNOSIS — I6529 Occlusion and stenosis of unspecified carotid artery: Secondary | ICD-10-CM | POA: Insufficient documentation

## 2012-12-21 DIAGNOSIS — I2510 Atherosclerotic heart disease of native coronary artery without angina pectoris: Secondary | ICD-10-CM

## 2012-12-21 DIAGNOSIS — I6523 Occlusion and stenosis of bilateral carotid arteries: Secondary | ICD-10-CM

## 2012-12-21 DIAGNOSIS — I658 Occlusion and stenosis of other precerebral arteries: Secondary | ICD-10-CM

## 2012-12-21 DIAGNOSIS — E119 Type 2 diabetes mellitus without complications: Secondary | ICD-10-CM

## 2012-12-21 MED ORDER — METOPROLOL SUCCINATE ER 25 MG PO TB24: 25.0000 mg | ORAL_TABLET | Freq: Every day | ORAL | Status: DC

## 2012-12-21 MED ORDER — ROSUVASTATIN CALCIUM 20 MG PO TABS: 20.0000 mg | ORAL_TABLET | Freq: Every day | ORAL | Status: DC

## 2012-12-21 NOTE — Assessment & Plan Note (Signed)
Cholesterol is at goal on the current lipid regimen. No changes to the medications were made.  

## 2012-12-21 NOTE — Assessment & Plan Note (Signed)
Mild carotid arterial disease in 2013. Recheck today. If relatively unchanged, could recheck in several years time.

## 2012-12-21 NOTE — Progress Notes (Signed)
Patient ID: Dana Ramirez, female    DOB: June 29, 1951, 62 y.o.   MRN: [id number]  HPI Comments: Dana Ramirez is a very pleasant 62 year old woman with a history of coronary artery disease, bypass surgery in September 2006 with a LIMA to the LAD, vein graft to the diagonal, negative stress test in 2010, moderate left carotid arterial disease 2010,  who presents for routine followup.   She reports that she is doing well. She has been active,  denies any significant chest pain with exertion.   she feels well  She does report having some lightheaded episodes, particularly with standing . Blood pressure has been running low .  She is trying to work on her weight and her diabetes. She reports her weight has been slowly declining . Her last hemoglobin A1c was 7, down from 8.3  She has been taking Crestor 20 mg alternating with 10 mg.  Most recent total cholesterol 153, LDL 49, HDL 82 in November 2013   Cardiac catheterization done September 2006 showed a diffusely diseased LAD from the left main to the midportion with lesion up to 90-95%, 95% ostial diagonal disease.   EKG shows normal sinus rhythm with rate 70 beats a minute with no significant ST or T wave changes.   Outpatient Encounter Prescriptions as of 12/21/2012  Medication Sig Dispense Refill  . aspirin (ASPIR-LOW) 81 MG EC tablet Take 162 mg by mouth daily.        . calcium carbonate 200 MG capsule Take 1 capsule by mouth daily.        . Cholecalciferol (VITAMIN D3) 3000 UNITS TABS Take 2,000 Units by mouth daily.        Marland Kitchen losartan (COZAAR) 50 MG tablet TAKE 1 TABLET (50 MG TOTAL) BY MOUTH DAILY.  30 tablet  6  . metFORMIN (GLUCOPHAGE) 500 MG tablet Take 500 mg by mouth 3 (three) times daily.      . metoprolol succinate (TOPROL-XL) 25 MG 24 hr tablet Take 1 tablet (25 mg total) by mouth daily.  90 tablet  3  . Multiple Vitamin (MULTIVITAMIN) tablet Take by mouth.        . CRESTOR 20 MG tablet TAKE ONE TABLET BY MOUTH EVERY OTHER DAY  ALTERNATED ONE HALF TABLET EVERY OTHER DAY.  30 tablet  6    Review of Systems  Constitutional: Negative.   HENT: Negative.   Eyes: Negative.   Respiratory: Negative.   Gastrointestinal: Negative.   Musculoskeletal: Negative.   Skin: Negative.   Neurological: Negative.   Psychiatric/Behavioral: Negative.   All other systems reviewed and are negative.    BP 98/68  Pulse 70  Ht 5\' 3"  (1.6 m)  Wt 163 lb 12 oz (74.277 kg)  BMI 29.01 kg/m2 Blood pressure checked by myself 105/70  Physical Exam  Nursing note and vitals reviewed. Constitutional: She is oriented to person, place, and time. She appears well-developed and well-nourished.  HENT:  Head: Normocephalic.  Nose: Nose normal.  Mouth/Throat: Oropharynx is clear and moist.  Eyes: Conjunctivae are normal. Pupils are equal, round, and reactive to light.  Neck: Normal range of motion. Neck supple. No JVD present.  Cardiovascular: Normal rate, regular rhythm, S1 normal, S2 normal, normal heart sounds and intact distal pulses.  Exam reveals no gallop and no friction rub.   No murmur heard. Pulmonary/Chest: Effort normal and breath sounds normal. No respiratory distress. She has no wheezes. She has no rales. She exhibits no tenderness.  Abdominal: Soft. Bowel sounds  are normal. She exhibits no distension. There is no tenderness.  Musculoskeletal: Normal range of motion. She exhibits no edema and no tenderness.  Lymphadenopathy:    She has no cervical adenopathy.  Neurological: She is alert and oriented to person, place, and time. Coordination normal.  Skin: Skin is warm and dry. No rash noted. No erythema.  Psychiatric: She has a normal mood and affect. Her behavior is normal. Judgment and thought content normal.    Assessment and Plan

## 2012-12-21 NOTE — Assessment & Plan Note (Signed)
Currently with no symptoms of angina. No further workup at this time. Continue current medication regimen. 

## 2012-12-21 NOTE — Patient Instructions (Addendum)
Please cut the losartan in 1/2 daily Monitor your blood pressure If it continue to run low, hold the losartan   Please call us if you have new issues that need to be addressed before your next appt.  Your physician wants you to follow-up in: 6 months.  You will receive a reminder letter in the mail two months in advance. If you don't receive a letter, please call our office to schedule the follow-up appointment.

## 2012-12-21 NOTE — Assessment & Plan Note (Signed)
Hemoglobin A1c has improved. We have complemented her on the improvement. Recommended continued slow weight loss, exercise

## 2012-12-21 NOTE — Assessment & Plan Note (Deleted)
Mild carotid disease in 2013.

## 2012-12-21 NOTE — Assessment & Plan Note (Signed)
Low blood pressure today. We have suggested she cut her losartan in half. If blood pressure continues to run low, she could hold the medication. Continue metoprolol

## 2013-01-02 ENCOUNTER — Other Ambulatory Visit: Payer: Self-pay | Admitting: Cardiovascular Disease

## 2013-01-02 NOTE — Telephone Encounter (Signed)
Refilled Losartan and [facility]rolol [facility].

## 2013-05-25 ENCOUNTER — Other Ambulatory Visit: Payer: Self-pay

## 2013-07-26 ENCOUNTER — Ambulatory Visit (INDEPENDENT_AMBULATORY_CARE_PROVIDER_SITE_OTHER): Payer: BC Managed Care – PPO | Admitting: Cardiovascular Disease

## 2013-07-26 ENCOUNTER — Encounter: Payer: Self-pay | Admitting: Cardiovascular Disease

## 2013-07-26 VITALS — BP 100/78 | HR 71 | Ht 63.5 in | Wt 164.8 lb

## 2013-07-26 DIAGNOSIS — I6523 Occlusion and stenosis of bilateral carotid arteries: Secondary | ICD-10-CM

## 2013-07-26 DIAGNOSIS — I2510 Atherosclerotic heart disease of native coronary artery without angina pectoris: Secondary | ICD-10-CM

## 2013-07-26 DIAGNOSIS — E119 Type 2 diabetes mellitus without complications: Secondary | ICD-10-CM

## 2013-07-26 DIAGNOSIS — I658 Occlusion and stenosis of other precerebral arteries: Secondary | ICD-10-CM

## 2013-07-26 DIAGNOSIS — I6529 Occlusion and stenosis of unspecified carotid artery: Secondary | ICD-10-CM

## 2013-07-26 DIAGNOSIS — E785 Hyperlipidemia, unspecified: Secondary | ICD-10-CM

## 2013-07-26 MED ORDER — LOSARTAN POTASSIUM 50 MG PO TABS: 50.0000 mg | ORAL_TABLET | Freq: Every day | ORAL | Status: DC

## 2013-07-26 NOTE — Patient Instructions (Addendum)
You are doing well. No medication changes were made.  Please call us if you have new issues that need to be addressed before your next appt.  Your physician wants you to follow-up in: 6 months.  You will receive a reminder letter in the mail two months in advance. If you don't receive a letter, please call our office to schedule the follow-up appointment.   

## 2013-07-26 NOTE — Assessment & Plan Note (Signed)
Stable carotid disease on most recent study in 2014

## 2013-07-26 NOTE — Assessment & Plan Note (Signed)
Currently with no symptoms of angina. No further workup at this time. Continue current medication regimen. 

## 2013-07-26 NOTE — Assessment & Plan Note (Signed)
We have encouraged continued exercise, careful diet management in an effort to lose weight. 

## 2013-07-26 NOTE — Assessment & Plan Note (Signed)
Cholesterol is at goal on the current lipid regimen. No changes to the medications were made. She will follow up with Dr. Venia Minks for routine blood work

## 2013-07-26 NOTE — Progress Notes (Signed)
   Patient ID: Dana Ramirez, female    DOB: [birth year], 63 y.o.   MRN: [id number]  HPI Comments: Dana Ramirez is a very pleasant 63 year old woman with a history of coronary artery disease, bypass surgery in September 2006 with a LIMA to the LAD, vein graft to the diagonal, negative stress test in 2010, moderate left carotid arterial disease 2010,  who presents for routine followup.   She reports that she is doing well. She has been active,  denies any significant chest pain with exertion.   she feels well  On her last clinic visit, we decreased her losartan. This helped her lightheaded spells She is trying to work on her weight and her diabetes.   She has been taking Crestor 20 mg alternating with 10 mg.  Most recent total cholesterol 153, LDL 49, HDL 82 in November 2013   Cardiac catheterization done September 2006 showed a diffusely diseased LAD from the left main to the midportion with lesion up to 90-95%, 95% ostial diagonal disease.   EKG shows normal sinus rhythm with rate 71 and well beats a minute with no significant ST or T wave changes.   Outpatient Encounter Prescriptions as of 07/26/2013  Medication Sig  . aspirin (ASPIR-LOW) 81 MG EC tablet Take 162 mg by mouth daily.    Marland Kitchen losartan (COZAAR) 50 MG tablet Take 0.5 tablets (25 mg total) by mouth daily.  . metFORMIN (GLUCOPHAGE) 500 MG tablet Take 500 mg by mouth 3 (three) times daily.  . metoprolol succinate (TOPROL-XL) 25 MG 24 hr tablet Take 1 tablet (25 mg total) by mouth daily.  . rosuvastatin (CRESTOR) 20 MG tablet Take 20 mg alt with 10 mg daily.    Review of Systems  Constitutional: Negative.   HENT: Negative.   Eyes: Negative.   Respiratory: Negative.   Cardiovascular: Negative.   Gastrointestinal: Negative.   Endocrine: Negative.   Musculoskeletal: Negative.   Skin: Negative.   Allergic/Immunologic: Negative.   Neurological: Negative.   Hematological: Negative.   Psychiatric/Behavioral: Negative.   All other  systems reviewed and are negative.    BP 100/78  Pulse 71  Ht 5' 3.5" (1.613 m)  Wt 164 lb 12 oz (74.73 kg)  BMI 28.72 kg/m2  Physical Exam  Nursing note and vitals reviewed. Constitutional: She is oriented to person, place, and time. She appears well-developed and well-nourished.  HENT:  Head: Normocephalic.  Nose: Nose normal.  Mouth/Throat: Oropharynx is clear and moist.  Eyes: Conjunctivae are normal. Pupils are equal, round, and reactive to light.  Neck: Normal range of motion. Neck supple. No JVD present.  Cardiovascular: Normal rate, regular rhythm, S1 normal, S2 normal, normal heart sounds and intact distal pulses.  Exam reveals no gallop and no friction rub.   No murmur heard. Pulmonary/Chest: Effort normal and breath sounds normal. No respiratory distress. She has no wheezes. She has no rales. She exhibits no tenderness.  Abdominal: Soft. Bowel sounds are normal. She exhibits no distension. There is no tenderness.  Musculoskeletal: Normal range of motion. She exhibits no edema and no tenderness.  Lymphadenopathy:    She has no cervical adenopathy.  Neurological: She is alert and oriented to person, place, and time. Coordination normal.  Skin: Skin is warm and dry. No rash noted. No erythema.  Psychiatric: She has a normal mood and affect. Her behavior is normal. Judgment and thought content normal.    Assessment and Plan

## 2014-01-18 ENCOUNTER — Other Ambulatory Visit: Payer: Self-pay | Admitting: Cardiovascular Disease

## 2014-01-25 ENCOUNTER — Encounter: Payer: Self-pay | Admitting: Cardiovascular Disease

## 2014-01-25 ENCOUNTER — Ambulatory Visit (INDEPENDENT_AMBULATORY_CARE_PROVIDER_SITE_OTHER): Payer: BC Managed Care – PPO | Admitting: Cardiovascular Disease

## 2014-01-25 VITALS — BP 118/78 | Ht 63.5 in | Wt 162.8 lb

## 2014-01-25 DIAGNOSIS — L237 Allergic contact dermatitis due to plants, except food: Secondary | ICD-10-CM | POA: Insufficient documentation

## 2014-01-25 DIAGNOSIS — I2510 Atherosclerotic heart disease of native coronary artery without angina pectoris: Secondary | ICD-10-CM

## 2014-01-25 DIAGNOSIS — I6522 Occlusion and stenosis of left carotid artery: Secondary | ICD-10-CM

## 2014-01-25 DIAGNOSIS — Z951 Presence of aortocoronary bypass graft: Secondary | ICD-10-CM

## 2014-01-25 DIAGNOSIS — I6529 Occlusion and stenosis of unspecified carotid artery: Secondary | ICD-10-CM

## 2014-01-25 DIAGNOSIS — L255 Unspecified contact dermatitis due to plants, except food: Secondary | ICD-10-CM

## 2014-01-25 DIAGNOSIS — E118 Type 2 diabetes mellitus with unspecified complications: Secondary | ICD-10-CM

## 2014-01-25 DIAGNOSIS — E785 Hyperlipidemia, unspecified: Secondary | ICD-10-CM

## 2014-01-25 MED ORDER — PREDNISONE 20 MG PO TABS: 20.0000 mg | ORAL_TABLET | Freq: Every day | ORAL | Status: DC

## 2014-01-25 NOTE — Assessment & Plan Note (Signed)
Cholesterol is at goal on the current lipid regimen. No changes to the medications were made.  

## 2014-01-25 NOTE — Patient Instructions (Addendum)
You are doing well. No medication changes were made.  Meclizine for vertigo (1/2 or whole)  Prednisone taper for poison ivy: 60mg  once daily for 3 days 40mg  once daily for 3 days 20mg  once daily for 3 days 10mg  once daily for 3 days  We will check labs today  Please call us if you have new issues that need to be addressed before your next appt.  Your physician wants you to follow-up in: 6 months.  You will receive a reminder letter in the mail two months in advance. If you don't receive a letter, please call our office to schedule the follow-up appointment.

## 2014-01-25 NOTE — Assessment & Plan Note (Signed)
40 to 59% disease on the left. Continue aggressive cholesterol management

## 2014-01-25 NOTE — Progress Notes (Signed)
Patient ID: Dana Ramirez, female    DOB: [birth year], 63 y.o.   MRN: [id number]  HPI Comments: Dana Ramirez is a very pleasant 63 year old woman with a history of coronary artery disease, bypass surgery in September 2006 with a LIMA to the LAD, vein graft to the diagonal, negative stress test in 2010, moderate left carotid arterial disease 2010,  who presents for routine followup.  She recently developed severe poison ivy on both lower extremities anterior and posterior aspect of her legs. Also developed vertigo in the past week with episodes of nausea and vomiting She has been active,  denies any significant chest pain with exertion.     Losartan decreased in the past. This helped her lightheaded spells She is trying to work on her weight and her diabetes.  She has been taking Crestor 20 mg alternating with 10 mg.  total cholesterol 153, LDL 49, HDL 82 in November 2013   Cardiac catheterization done September 2006 showed a diffusely diseased LAD from the left main to the midportion with lesion up to 90-95%, 95% ostial diagonal disease.   EKG shows normal sinus rhythm with rate 63 beats per minute with no significant ST or T wave changes.   Outpatient Encounter Prescriptions as of 01/25/2014  Medication Sig  . aspirin (ASPIR-LOW) 81 MG EC tablet Take 162 mg by mouth daily.    Marland Kitchen losartan (COZAAR) 50 MG tablet Take 1 tablet (50 mg total) by mouth daily.  . metFORMIN (GLUCOPHAGE) 500 MG tablet Take 1,000 mg by mouth 2 (two) times daily with a meal.   . metoprolol succinate (TOPROL-XL) 25 MG 24 hr tablet TAKE 1 TABLET BY MOUTH EVERY DAY  . predniSONE (DELTASONE) 20 MG tablet Take 1 tablet (20 mg total) by mouth daily with breakfast.  . rosuvastatin (CRESTOR) 20 MG tablet Take 20 mg alt with 10 mg daily.    Review of Systems  Constitutional: Negative.   HENT: Negative.   Eyes: Negative.   Respiratory: Negative.   Cardiovascular: Negative.   Gastrointestinal: Negative.   Endocrine:  Negative.   Musculoskeletal: Negative.   Skin: Negative.   Allergic/Immunologic: Negative.   Neurological: Negative.   Hematological: Negative.   Psychiatric/Behavioral: Negative.   All other systems reviewed and are negative.   BP 118/78  Ht 5' 3.5" (1.613 m)  Wt 162 lb 12 oz (73.823 kg)  BMI 28.37 kg/m2  Physical Exam  Nursing note and vitals reviewed. Constitutional: She is oriented to person, place, and time. She appears well-developed and well-nourished.  HENT:  Head: Normocephalic.  Nose: Nose normal.  Mouth/Throat: Oropharynx is clear and moist.  Eyes: Conjunctivae are normal. Pupils are equal, round, and reactive to light.  Neck: Normal range of motion. Neck supple. No JVD present.  Cardiovascular: Normal rate, regular rhythm, S1 normal, S2 normal, normal heart sounds and intact distal pulses.  Exam reveals no gallop and no friction rub.   No murmur heard. Pulmonary/Chest: Effort normal and breath sounds normal. No respiratory distress. She has no wheezes. She has no rales. She exhibits no tenderness.  Abdominal: Soft. Bowel sounds are normal. She exhibits no distension. There is no tenderness.  Musculoskeletal: Normal range of motion. She exhibits no edema and no tenderness.  Lymphadenopathy:    She has no cervical adenopathy.  Neurological: She is alert and oriented to person, place, and time. Coordination normal.  Skin: Skin is warm and dry. Rash noted. No erythema.  Psychiatric: She has a normal mood and affect. Her  behavior is normal. Judgment and thought content normal.    Assessment and Plan

## 2014-01-25 NOTE — Assessment & Plan Note (Signed)
Severe poison ivy over the anterior and posterior aspect of her lower extremities. She is very symptomatic. Prednisone taper was offered and stent to the pharmacy

## 2014-01-25 NOTE — Assessment & Plan Note (Signed)
We have encouraged continued exercise, careful diet management in an effort to lose weight. 

## 2014-01-25 NOTE — Assessment & Plan Note (Signed)
Currently with no symptoms of angina. No further workup at this time. Continue current medication regimen. 

## 2014-01-26 LAB — HEPATIC FUNCTION PANEL
ALK PHOS: 75 IU/L (ref 39–117)
ALT: 25 IU/L (ref 0–32)
AST: 22 IU/L (ref 0–40)
Albumin: 4.6 g/dL (ref 3.6–4.8)
Bilirubin, Direct: 0.11 mg/dL (ref 0.00–0.40)
Total Bilirubin: 0.3 mg/dL (ref 0.0–1.2)
Total Protein: 6.5 g/dL (ref 6.0–8.5)

## 2014-01-26 LAB — LIPID PANEL
CHOLESTEROL TOTAL: 132 mg/dL (ref 100–199)
Chol/HDL Ratio: 1.9 ratio units (ref 0.0–4.4)
HDL: 71 mg/dL (ref >39)
LDL Calculated: 42 mg/dL (ref 0–99)
TRIGLYCERIDES: 95 mg/dL (ref 0–149)
VLDL Cholesterol Cal: 19 mg/dL (ref 5–40)

## 2014-01-29 ENCOUNTER — Encounter: Payer: Self-pay | Admitting: Cardiovascular Disease

## 2014-02-02 ENCOUNTER — Telehealth: Payer: Self-pay | Admitting: *Deleted

## 2014-02-02 NOTE — Telephone Encounter (Signed)
Lmom to call our office. Time to schedule carotid doppler. (1 yr f/u)

## 2014-02-06 ENCOUNTER — Other Ambulatory Visit (HOSPITAL_COMMUNITY): Payer: Self-pay | Admitting: *Deleted

## 2014-02-06 DIAGNOSIS — I6529 Occlusion and stenosis of unspecified carotid artery: Secondary | ICD-10-CM

## 2014-02-08 ENCOUNTER — Ambulatory Visit: Payer: Self-pay | Admitting: Family Medicine

## 2014-02-13 ENCOUNTER — Encounter (INDEPENDENT_AMBULATORY_CARE_PROVIDER_SITE_OTHER): Payer: BC Managed Care – PPO

## 2014-02-13 DIAGNOSIS — I6529 Occlusion and stenosis of unspecified carotid artery: Secondary | ICD-10-CM

## 2014-02-15 ENCOUNTER — Other Ambulatory Visit: Payer: Self-pay | Admitting: Cardiovascular Disease

## 2014-05-04 ENCOUNTER — Other Ambulatory Visit: Payer: Self-pay

## 2014-05-30 ENCOUNTER — Other Ambulatory Visit: Payer: Self-pay | Admitting: Cardiovascular Disease

## 2014-07-24 ENCOUNTER — Encounter: Payer: Self-pay | Admitting: Cardiovascular Disease

## 2014-07-24 ENCOUNTER — Ambulatory Visit (INDEPENDENT_AMBULATORY_CARE_PROVIDER_SITE_OTHER): Payer: BC Managed Care – PPO | Admitting: Cardiovascular Disease

## 2014-07-24 VITALS — BP 118/78 | HR 65 | Ht 63.0 in | Wt 155.5 lb

## 2014-07-24 DIAGNOSIS — E785 Hyperlipidemia, unspecified: Secondary | ICD-10-CM

## 2014-07-24 DIAGNOSIS — I2510 Atherosclerotic heart disease of native coronary artery without angina pectoris: Secondary | ICD-10-CM

## 2014-07-24 DIAGNOSIS — I6522 Occlusion and stenosis of left carotid artery: Secondary | ICD-10-CM

## 2014-07-24 DIAGNOSIS — E118 Type 2 diabetes mellitus with unspecified complications: Secondary | ICD-10-CM

## 2014-07-24 NOTE — Assessment & Plan Note (Signed)
Currently with no symptoms of angina. No further workup at this time. Continue current medication regimen. 

## 2014-07-24 NOTE — Progress Notes (Signed)
Patient ID: Dana Ramirez, female    DOB: Oct 27, 1950, 64 y.o.   MRN: [id number]  HPI Comments: Dana Ramirez is a very pleasant 64 year old woman with a history of coronary artery disease, bypass surgery in September 2006 with a LIMA to the LAD, vein graft to the diagonal, negative stress test in 2010, moderate left carotid arterial disease 2010,  who presents for routine followup of her coronary artery disease  In follow-up today, she reports that she is doing well. She is following a low calorie diet, has had weight loss. Blood pressure has been well controlled at home. Recent lab work showing significant improvement in her cholesterol. No regular exercise but she is very active at baseline She has been taking Crestor 20 mg alternating with 10 mg.  Recent lab work showing total cholesterol 134, HDL 69, LDL 46  EKG on today's visit shows normal sinus rhythm with rate 65 bpm, no significant ST or T-wave changes  Other past medical history  prior history of vertigo  Losartan decreased in the past. This helped her lightheaded spells   Cardiac catheterization done September 2006 showed a diffusely diseased LAD from the left main to the midportion with lesion up to 90-95%, 95% ostial diagonal disease.     Allergies  Allergen Reactions  . Codeine   . Penicillins     Outpatient Encounter Prescriptions as of 07/24/2014  Medication Sig  . aspirin (ASPIR-LOW) 81 MG EC tablet Take 162 mg by mouth daily.    Marland Kitchen losartan (COZAAR) 50 MG tablet Take 1 tablet (50 mg total) by mouth daily.  . metFORMIN (GLUCOPHAGE) 500 MG tablet Take 1,000 mg by mouth 2 (two) times daily with a meal.   . metoprolol succinate (TOPROL-XL) 25 MG 24 hr tablet TAKE 1 TABLET BY MOUTH EVERY DAY  . rosuvastatin (CRESTOR) 20 MG tablet Take 20 mg alt with 10 mg daily.  . [DISCONTINUED] CRESTOR 20 MG tablet TAKE 1 TABLET BY MOUTH EVERY DAY (Patient not taking: Reported on 07/24/2014)  . [DISCONTINUED] predniSONE (DELTASONE) 20 MG  tablet Take 1 tablet (20 mg total) by mouth daily with breakfast. (Patient not taking: Reported on 07/24/2014)    Past Medical History  Diagnosis Date  . Carotid arterial disease   . CAD (coronary artery disease)   . Hyperlipidemia   . Hypertension   . History of vertigo     Past Surgical History  Procedure Laterality Date  . 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 Cholecystectomy    . Cesarean section      x 2  . Tonsillectomy      Social History  reports that she has never smoked. She does not have any smokeless tobacco history on file. She reports that she does not drink alcohol or use illicit drugs.  Family History family history includes Coronary artery disease in her other; Heart disease in her father, maternal grandfather, maternal grandmother, mother, paternal grandfather, and paternal grandmother; Stroke in her sister.   Review of Systems  Constitutional: Negative.   HENT: Negative.   Respiratory: Negative.   Cardiovascular: Negative.   Gastrointestinal: Negative.   Endocrine: Negative.   Musculoskeletal: Negative.   Neurological: Negative.   Hematological: Negative.   Psychiatric/Behavioral: Negative.   All other systems reviewed and are negative.   BP 118/78 mmHg  Pulse 65  Ht 5\' 3"  (1.6 m)  Wt 155  lb 8 oz (70.534 kg)  BMI 27.55 kg/m2  Physical Exam  Constitutional: She is oriented to person, place, and time. She appears well-developed and well-nourished.  HENT:  Head: Normocephalic.  Nose: Nose normal.  Mouth/Throat: Oropharynx is clear and moist.  Eyes: Conjunctivae are normal. Pupils are equal, round, and reactive to light.  Neck: Normal range of motion. Neck supple. No JVD present.  Cardiovascular: Normal rate, regular rhythm, S1 normal, S2 normal, normal heart sounds and intact distal pulses.  Exam reveals no gallop and no friction rub.   No murmur  heard. Pulmonary/Chest: Effort normal and breath sounds normal. No respiratory distress. She has no wheezes. She has no rales. She exhibits no tenderness.  Abdominal: Soft. Bowel sounds are normal. She exhibits no distension. There is no tenderness.  Musculoskeletal: Normal range of motion. She exhibits no edema or tenderness.  Lymphadenopathy:    She has no cervical adenopathy.  Neurological: She is alert and oriented to person, place, and time. Coordination normal.  Skin: Skin is warm and dry. No rash noted. No erythema.  Psychiatric: She has a normal mood and affect. Her behavior is normal. Judgment and thought content normal.    Assessment and Plan  Nursing note and vitals reviewed.

## 2014-07-24 NOTE — Assessment & Plan Note (Signed)
Cholesterol is at goal on the current lipid regimen. No changes to the medications were made.  

## 2014-07-24 NOTE — Assessment & Plan Note (Signed)
We have encouraged continued exercise, careful diet management in an effort to lose weight. Recent hemoglobin A1c significant improved, 6.7

## 2014-07-24 NOTE — Patient Instructions (Signed)
You are doing well. No medication changes were made.  Please call us if you have new issues that need to be addressed before your next appt.  Your physician wants you to follow-up in: 6 months.  You will receive a reminder letter in the mail two months in advance. If you don't receive a letter, please call our office to schedule the follow-up appointment.   

## 2014-07-24 NOTE — Assessment & Plan Note (Signed)
40 to 59% disease on the left. Continue aggressive cholesterol management

## 2014-11-26 ENCOUNTER — Ambulatory Visit
Admission: RE | Admit: 2014-11-26 | Discharge: 2014-11-26 | Disposition: A | Discharge status: Home / Self Care | Payer: BC Managed Care – PPO | Source: Ambulatory Visit | Attending: Family Medicine | Admitting: Family Medicine

## 2014-11-26 ENCOUNTER — Other Ambulatory Visit: Payer: Self-pay | Admitting: Family Medicine

## 2014-11-26 DIAGNOSIS — R059 Cough, unspecified: Secondary | ICD-10-CM

## 2014-11-26 DIAGNOSIS — R05 Cough: Secondary | ICD-10-CM

## 2014-12-02 ENCOUNTER — Other Ambulatory Visit: Payer: Self-pay | Admitting: Cardiovascular Disease

## 2014-12-20 ENCOUNTER — Other Ambulatory Visit: Payer: Self-pay | Admitting: Family Medicine

## 2014-12-20 ENCOUNTER — Encounter: Payer: Self-pay | Admitting: Family Medicine

## 2014-12-20 ENCOUNTER — Ambulatory Visit
Admission: RE | Admit: 2014-12-20 | Discharge: 2014-12-20 | Disposition: A | Discharge status: Home / Self Care | Payer: BC Managed Care – PPO | Source: Ambulatory Visit | Attending: Family Medicine | Admitting: Family Medicine

## 2014-12-20 ENCOUNTER — Ambulatory Visit (INDEPENDENT_AMBULATORY_CARE_PROVIDER_SITE_OTHER): Payer: BC Managed Care – PPO | Admitting: Family Medicine

## 2014-12-20 VITALS — BP 102/80 | HR 60 | Temp 98.0°F | Resp 16 | Ht 64.0 in | Wt 155.0 lb

## 2014-12-20 DIAGNOSIS — R319 Hematuria, unspecified: Secondary | ICD-10-CM | POA: Diagnosis not present

## 2014-12-20 DIAGNOSIS — R109 Unspecified abdominal pain: Secondary | ICD-10-CM | POA: Diagnosis not present

## 2014-12-20 LAB — POCT URINALYSIS DIPSTICK
Bilirubin, UA: NEGATIVE
Glucose, UA: 100
Ketones, UA: NEGATIVE
Leukocytes, UA: NEGATIVE
Nitrite, UA: NEGATIVE
Protein, UA: NEGATIVE
Spec Grav, UA: 1.01
UROBILINOGEN UA: NEGATIVE
pH, UA: 7

## 2014-12-20 NOTE — Patient Instructions (Addendum)
Push fluids and use up to 3000 mg./ day for pain. May call for additional pain medication. Strain urine.

## 2014-12-20 NOTE — Progress Notes (Signed)
Subjective:     Patient ID: Dana Ramirez, female   DOB: [birth year], 64 y.o.   MRN: [id number]  Back Pain  States pain has ranged up to 9.5 last night. Reports never having a UTI but states she does not like to drink a lot of fluids.   Review of Systems  Genitourinary: Flank pain: no cva tenderness.  Musculoskeletal: Positive for back pain.       Objective:   Physical Exam  Abdominal: There is tenderness (left lower quadrant; no cva tenderness). There is no guarding.       Assessment:     1. Hematuria  - POCT Urinalysis Dipstick - Urine Culture - DG Abd 1 View; Future  2. Left flank pain  - DG Abd 1 View; Future    Plan:     Screen urine and push fluids Return or call if not improving. Defers narcotic medication-will continue with acetaminophen.

## 2014-12-21 ENCOUNTER — Telehealth: Payer: Self-pay

## 2014-12-21 LAB — URINE CULTURE

## 2014-12-21 NOTE — Telephone Encounter (Signed)
-----   Message from Carmon Ginsberg, [place] sent at 12/21/2014  5:31 PM EDT ----- No bladder infection

## 2014-12-21 NOTE — Telephone Encounter (Signed)
-----   Message from Carmon Ginsberg, [place] sent at 12/21/2014  8:16 AM EDT ----- Radiologist sees faint evidence of a stone on the left side where you have been having pain. If not resolving over the next few days or pain worsening, call for urology referral

## 2014-12-21 NOTE — Telephone Encounter (Signed)
Patient advised that urine culture came back negative as stated below in message. KW

## 2014-12-21 NOTE — Telephone Encounter (Signed)
Have not received urine culture result back

## 2014-12-21 NOTE — Telephone Encounter (Signed)
Patient was advised of x-ray report as below, she would like to know if you have gotten the results of her culture back? KW

## 2014-12-27 ENCOUNTER — Telehealth: Payer: Self-pay

## 2014-12-27 NOTE — Telephone Encounter (Signed)
Called and spoke with [facility] rep, report being electronically sent.

## 2014-12-27 NOTE — Telephone Encounter (Signed)
-----   Message from Carmon Ginsberg, [place] sent at 12/27/2014 11:00 AM EDT ----- Please have [facility] electronically send [place] her urine culture results from 6/2 so I can sign off on this. I think we received a paper copy only.

## 2014-12-29 ENCOUNTER — Emergency Department: Payer: BC Managed Care – PPO

## 2014-12-29 ENCOUNTER — Emergency Department
Admission: EM | Admit: 2014-12-29 | Discharge: 2014-12-29 | Disposition: A | Discharge status: Home / Self Care | Payer: BC Managed Care – PPO | Attending: Emergency Medicine | Admitting: Emergency Medicine

## 2014-12-29 DIAGNOSIS — Z880 Allergy status to penicillin: Secondary | ICD-10-CM | POA: Insufficient documentation

## 2014-12-29 DIAGNOSIS — I10 Essential (primary) hypertension: Secondary | ICD-10-CM | POA: Insufficient documentation

## 2014-12-29 DIAGNOSIS — R109 Unspecified abdominal pain: Secondary | ICD-10-CM | POA: Diagnosis present

## 2014-12-29 DIAGNOSIS — E119 Type 2 diabetes mellitus without complications: Secondary | ICD-10-CM | POA: Diagnosis not present

## 2014-12-29 DIAGNOSIS — N23 Unspecified renal colic: Secondary | ICD-10-CM | POA: Diagnosis not present

## 2014-12-29 DIAGNOSIS — R112 Nausea with vomiting, unspecified: Secondary | ICD-10-CM | POA: Insufficient documentation

## 2014-12-29 HISTORY — DX: Type 2 diabetes mellitus without complications: E11.9

## 2014-12-29 LAB — COMPREHENSIVE METABOLIC PANEL
ALBUMIN: 4.5 g/dL (ref 3.5–5.0)
ALK PHOS: 90 U/L (ref 38–126)
ALT: 15 U/L (ref 14–54)
AST: 23 U/L (ref 15–41)
Anion gap: 8 (ref 5–15)
BUN: 27 mg/dL — ABNORMAL HIGH (ref 6–20)
CO2: 26 mmol/L (ref 22–32)
Calcium: 9 mg/dL (ref 8.9–10.3)
Chloride: 105 mmol/L (ref 101–111)
Creatinine, Ser: 0.84 mg/dL (ref 0.44–1.00)
GFR calc non Af Amer: >60 mL/min (ref >60)
GLUCOSE: 251 mg/dL — AB (ref 65–99)
Potassium: 3.9 mmol/L (ref 3.5–5.1)
Sodium: 139 mmol/L (ref 135–145)
TOTAL PROTEIN: 8.1 g/dL (ref 6.5–8.1)
Total Bilirubin: 0.5 mg/dL (ref 0.3–1.2)

## 2014-12-29 LAB — CBC
HEMATOCRIT: 40.1 % (ref 35.0–47.0)
Hemoglobin: 13.3 g/dL (ref 12.0–16.0)
MCH: 30.7 pg (ref 26.0–34.0)
MCHC: 33.1 g/dL (ref 32.0–36.0)
MCV: 93 fL (ref 80.0–100.0)
Platelets: 206 10*3/uL (ref 150–440)
RBC: 4.31 MIL/uL (ref 3.80–5.20)
RDW: 13.2 % (ref 11.5–14.5)
WBC: 8.4 10*3/uL (ref 3.6–11.0)

## 2014-12-29 LAB — URINALYSIS COMPLETE WITH MICROSCOPIC (ARMC ONLY)
BACTERIA UA: NONE SEEN
Bilirubin Urine: NEGATIVE
Glucose, UA: NEGATIVE mg/dL
Ketones, ur: NEGATIVE mg/dL
Leukocytes, UA: NEGATIVE
Nitrite: NEGATIVE
PH: 5 (ref 5.0–8.0)
Protein, ur: NEGATIVE mg/dL
Specific Gravity, Urine: 1.019 (ref 1.005–1.030)

## 2014-12-29 MED ORDER — KETOROLAC TROMETHAMINE 30 MG/ML IJ SOLN
30.0000 mg | Freq: Once | INTRAMUSCULAR | Status: AC
  Administered 2014-12-29: 30 mg via INTRAVENOUS

## 2014-12-29 MED ORDER — TAMSULOSIN HCL 0.4 MG PO CAPS: 0.4000 mg | ORAL_CAPSULE | Freq: Every day | ORAL | Status: DC

## 2014-12-29 MED ORDER — KETOROLAC TROMETHAMINE 30 MG/ML IJ SOLN
INTRAMUSCULAR | Status: AC
  Administered 2014-12-29: 30 mg via INTRAVENOUS
  Filled 2014-12-29: qty 1

## 2014-12-29 MED ORDER — SODIUM CHLORIDE 0.9 % IV SOLN
Freq: Once | INTRAVENOUS | Status: AC
  Administered 2014-12-29: 14:00:00 via INTRAVENOUS

## 2014-12-29 MED ORDER — MORPHINE SULFATE 4 MG/ML IJ SOLN
4.0000 mg | Freq: Once | INTRAMUSCULAR | Status: AC
  Administered 2014-12-29: 4 mg via INTRAVENOUS

## 2014-12-29 MED ORDER — ONDANSETRON HCL 4 MG PO TABS: 4.0000 mg | ORAL_TABLET | Freq: Every day | ORAL | Status: DC | PRN

## 2014-12-29 MED ORDER — ONDANSETRON HCL 4 MG/2ML IJ SOLN
INTRAMUSCULAR | Status: AC
  Filled 2014-12-29: qty 2

## 2014-12-29 MED ORDER — OXYCODONE-ACETAMINOPHEN 5-325 MG PO TABS: 1.0000 | ORAL_TABLET | Freq: Four times a day (QID) | ORAL | Status: DC | PRN

## 2014-12-29 MED ORDER — MORPHINE SULFATE 4 MG/ML IJ SOLN
INTRAMUSCULAR | Status: AC
  Filled 2014-12-29: qty 1

## 2014-12-29 MED ORDER — ONDANSETRON HCL 4 MG/2ML IJ SOLN
4.0000 mg | Freq: Once | INTRAMUSCULAR | Status: AC
  Administered 2014-12-29: 4 mg via INTRAVENOUS

## 2014-12-29 NOTE — ED Notes (Signed)
Pt c/o right sided flank pain, hx of kidney stone X 2 weeks ago. Nausea, vomiting, chills. Pt alert and oriented X4, active, cooperative, pt in NAD. RR even and unlabored, color WNL.

## 2014-12-29 NOTE — ED Notes (Signed)
Patient to ED with c/o flank pain on right side that awoke the patient this AM. Patient states she was recently diagnosed with a kidney stone on the left side. Patient states she has also been experiencing nausea and vomiting with pain. Pain radiated from right abdomen to the right flank

## 2014-12-29 NOTE — ED Provider Notes (Addendum)
[facility] Emergency Department Provider Note     Time seen: ----------------------------------------- 12:20 PM on 12/29/2014 -----------------------------------------    I have reviewed the triage vital signs and the nursing notes.   HISTORY  Chief Complaint Flank Pain    HPI Dana Ramirez is a 64 y.o. female was going of persistent right-sided sharp flank pain. Since she woke this morning, she had the symptoms and then while she was waiting here they seemed to improve. She recently urinated out some dark sand-like substances. Over the past 2 weeks has had left-sided flank pain, she has no history of kidney stones prior to the last 2 weeks. Pain currently has subsided, denies fevers chills other complaints. She did have nausea and vomiting early with the pain.   Past Medical History  Diagnosis Date  . Carotid arterial disease   . CAD (coronary artery disease)   . Hyperlipidemia   . Hypertension   . History of vertigo     Patient Active Problem List   Diagnosis Date Noted  . Carotid stenosis 12/21/2012  . Orthostatic hypotension 12/21/2012  . Diabetes mellitus 10/20/2011  . Hyperlipidemia 12/19/2009  . CAD, NATIVE VESSEL 12/19/2009    Past Surgical History  Procedure Laterality Date  . 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 Cholecystectomy    . Cesarean section      x 2  . Tonsillectomy      Allergies Codeine and Penicillins  Social History History  Substance Use Topics  . Smoking status: Never Smoker   . Smokeless tobacco: Not on file  . Alcohol Use: No    Review of Systems Constitutional: Negative for fever. Eyes: Negative for visual changes. ENT: Negative for sore throat. Cardiovascular: Negative for chest pain. Respiratory: Negative for shortness of breath. Gastrointestinal: Positive for flank pain and vomiting Genitourinary:  Negative for dysuria. Musculoskeletal: Negative for back pain. Skin: Negative for rash. Neurological: Negative for headaches, focal weakness or numbness.  10-point ROS otherwise negative.  ____________________________________________   PHYSICAL EXAM:  VITAL SIGNS: ED Triage Vitals  Enc Vitals Group     BP 12/29/14 1113 118/58 mmHg     Pulse Rate 12/29/14 1113 66     Resp 12/29/14 1113 18     Temp 12/29/14 1113 97.6 F (36.4 C)     Temp Source 12/29/14 1113 Oral     SpO2 12/29/14 1113 100 %     Weight 12/29/14 1113 155 lb (70.308 kg)     Height 12/29/14 1113 '5\' 3"'$  (1.6 m)     Head Cir --      Peak Flow --      Pain Score 12/29/14 1113 9     Pain Loc --      Pain Edu? --      Excl. in [place]? --     Constitutional: Alert and oriented. Well appearing and in no distress. Eyes: Conjunctivae are normal. PERRL. Normal extraocular movements. ENT   Head: Normocephalic and atraumatic.   Nose: No congestion/rhinnorhea.   Mouth/Throat: Mucous membranes are moist.   Neck: No stridor. Hematological/Lymphatic/Immunilogical: No cervical lymphadenopathy. Cardiovascular: Normal rate, regular rhythm. Normal and symmetric distal pulses are present in all extremities. No murmurs, rubs, or gallops. Respiratory: Normal respiratory effort without tachypnea nor retractions. Breath sounds are clear and equal bilaterally. No wheezes/rales/rhonchi. Gastrointestinal: Soft and nontender. No distention. No abdominal bruits. There is  no CVA tenderness. Musculoskeletal: Nontender with normal range of motion in all extremities. No joint effusions.  No lower extremity tenderness nor edema. Neurologic:  Normal speech and language. No gross focal neurologic deficits are appreciated. Speech is normal. No gait instability. Skin:  Skin is warm, dry and intact. No rash noted. Psychiatric: Mood and affect are normal. Speech and behavior are normal. Patient exhibits appropriate insight and  judgment.  ____________________________________________  ED COURSE:  Pertinent labs & imaging results that were available during my care of the patient were reviewed by me and considered in my medical decision making (see chart for details). Patient needs CT renal protocol to evaluate for multiple recent stones, will check basic labs ____________________________________________    LABS (pertinent positives/negatives)  Labs Reviewed  URINALYSIS COMPLETEWITH MICROSCOPIC ([facility] ONLY) - Abnormal; Notable for the following:    Color, Urine YELLOW (*)    APPearance HAZY (*)    Hgb urine dipstick 3+ (*)    Squamous Epithelial / LPF 0-5 (*)    All other components within normal limits  COMPREHENSIVE METABOLIC PANEL - Abnormal; Notable for the following:    Glucose, Bld 251 (*)    BUN 27 (*)    All other components within normal limits  CBC    RADIOLOGY  CT renal protocol IMPRESSION: 1. Right worse than left hydroureteronephrosis. Distal right ureteric and left ureterovesicular junction obstructive calculi. 2. Hepatic steatosis. 3. Normal appendix. 4. Hypoattenuation within the central uterus could represent fluid or endometrial thickening. Correlate with symptoms of postmenopausal bleeding. If any such symptoms, recommend ultrasound. ____________________________________________  FINAL ASSESSMENT AND PLAN  Renal colic  Plan: Patient with remarkable bilateral renal colic after never having had a kidney stone before. Discussed his case with Dr. Erlene Quan, who agreed patient is stable for outpatient follow-up. She'll be discharged with Flomax Percocet and Zofran. She is advised to return for worsening or worrisome symptoms, no urine output, fever or other complaints.   Earleen Newport, MD   Earleen Newport, MD 12/29/14 [place], MD 12/29/14 [phone]

## 2014-12-29 NOTE — Discharge Instructions (Signed)

## 2015-01-14 ENCOUNTER — Other Ambulatory Visit: Payer: Self-pay

## 2015-01-28 ENCOUNTER — Encounter: Payer: Self-pay | Admitting: Cardiovascular Disease

## 2015-01-28 ENCOUNTER — Ambulatory Visit (INDEPENDENT_AMBULATORY_CARE_PROVIDER_SITE_OTHER): Payer: BC Managed Care – PPO | Admitting: Cardiovascular Disease

## 2015-01-28 VITALS — BP 100/62 | HR 75 | Ht 63.0 in | Wt 155.5 lb

## 2015-01-28 DIAGNOSIS — E785 Hyperlipidemia, unspecified: Secondary | ICD-10-CM | POA: Diagnosis not present

## 2015-01-28 DIAGNOSIS — I2510 Atherosclerotic heart disease of native coronary artery without angina pectoris: Secondary | ICD-10-CM

## 2015-01-28 DIAGNOSIS — I951 Orthostatic hypotension: Secondary | ICD-10-CM | POA: Diagnosis not present

## 2015-01-28 DIAGNOSIS — I6522 Occlusion and stenosis of left carotid artery: Secondary | ICD-10-CM | POA: Diagnosis not present

## 2015-01-28 MED ORDER — ROSUVASTATIN CALCIUM 20 MG PO TABS: 20.0000 mg | ORAL_TABLET | Freq: Every day | ORAL | Status: DC

## 2015-01-28 NOTE — Assessment & Plan Note (Signed)
40 to 59% disease on the left. Continue aggressive cholesterol management 

## 2015-01-28 NOTE — Patient Instructions (Signed)
You are doing well.  Please hold the losartan (blood pressure is low)  Please call us if you have new issues that need to be addressed before your next appt.  Your physician wants you to follow-up in: 6 months.  You will receive a reminder letter in the mail two months in advance. If you don't receive a letter, please call our office to schedule the follow-up appointment.

## 2015-01-28 NOTE — Assessment & Plan Note (Signed)
Currently with no symptoms of angina. No further workup at this time. Continue current medication regimen. 

## 2015-01-28 NOTE — Progress Notes (Signed)
Patient ID: Dana Ramirez, female    DOB: [birth year], 64 y.o.   MRN: [id number]  HPI Comments: Dana Ramirez is a very pleasant 64 year old woman with a history of coronary artery disease, bypass surgery in September 2006 with a LIMA to the LAD, vein graft to the diagonal, negative stress test in 2010, moderate left carotid arterial disease 2010,  who presents for routine followup of her coronary artery disease  In follow-up today, she reports that she is doing well.  She denies any significant chest pain or shortness of breath with exertion She does continue to have episodes of lightheadedness. On her last clinic visit we decreased the losartan down to 25 mg daily Blood pressure has been well controlled at home.  No regular exercise but she is very active at baseline She has been taking Crestor 20 mg alternating with 10 mg.  total cholesterol 134, HDL 69, LDL 46  Cholesterol well-controlled for the past 6-7 years  EKG on today's visit shows normal sinus rhythm with rate 75 bpm, no significant ST or T-wave changes  Other past medical history  prior history of vertigo  Losartan decreased in the past. This helped her lightheaded spells   Cardiac catheterization done September 2006 showed a diffusely diseased LAD from the left main to the midportion with lesion up to 90-95%, 95% ostial diagonal disease.     Allergies  Allergen Reactions  . Codeine   . Penicillins     Outpatient Encounter Prescriptions as of 01/28/2015  Medication Sig  . aspirin (ASPIR-LOW) 81 MG EC tablet Take 162 mg by mouth daily.    . cholecalciferol (VITAMIN D) 1000 UNITS tablet Take 2,000 Units by mouth daily.  . metFORMIN (GLUCOPHAGE) 500 MG tablet Take 500 mg by mouth daily with breakfast.   . metoprolol succinate (TOPROL-XL) 25 MG 24 hr tablet TAKE 1 TABLET BY MOUTH EVERY DAY  . ondansetron (ZOFRAN) 4 MG tablet Take 1 tablet (4 mg total) by mouth daily as needed for nausea or vomiting.  Marland Kitchen  oxyCODONE-acetaminophen (ROXICET) 5-325 MG per tablet Take 1 tablet by mouth every 6 (six) hours as needed.  . rosuvastatin (CRESTOR) 20 MG tablet Take 1 tablet (20 mg total) by mouth daily.  . [DISCONTINUED] losartan (COZAAR) 50 MG tablet Take 1 tablet (50 mg total) by mouth daily.  . [DISCONTINUED] rosuvastatin (CRESTOR) 20 MG tablet Take 20 mg alt with 10 mg daily.  . [DISCONTINUED] tamsulosin (FLOMAX) 0.4 MG CAPS capsule Take 1 capsule (0.4 mg total) by mouth daily after breakfast. (Patient not taking: Reported on 01/28/2015)   No facility-administered encounter medications on file as of 01/28/2015.    Past Medical History  Diagnosis Date  . Carotid arterial disease   . CAD (coronary artery disease)   . Hyperlipidemia   . Hypertension   . History of vertigo   . Diabetes mellitus without complication   . Kidney stone     Past Surgical History  Procedure Laterality Date  . 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 Cholecystectomy    . Cesarean section      x 2  . Tonsillectomy      Social History  reports that she has never smoked. She does not have any smokeless tobacco history on file. She reports that she does not drink alcohol or use illicit drugs.  Family History family history includes Coronary artery  disease in her other; Heart disease in her father, maternal grandfather, maternal grandmother, mother, paternal grandfather, and paternal grandmother; Stroke in her sister.   Review of Systems  Constitutional: Negative.   Respiratory: Negative.   Cardiovascular: Negative.   Gastrointestinal: Negative.   Musculoskeletal: Negative.   Neurological: Negative.   Hematological: Negative.   Psychiatric/Behavioral: Negative.   All other systems reviewed and are negative.   BP 100/62 mmHg  Pulse 75  Ht '5\' 3"'$  (1.6 m)  Wt 155 lb 8 oz (70.534 kg)  BMI 27.55 kg/m2  Physical Exam   Constitutional: She is oriented to person, place, and time. She appears well-developed and well-nourished.  HENT:  Head: Normocephalic.  Nose: Nose normal.  Mouth/Throat: Oropharynx is clear and moist.  Eyes: Conjunctivae are normal. Pupils are equal, round, and reactive to light.  Neck: Normal range of motion. Neck supple. No JVD present.  Cardiovascular: Normal rate, regular rhythm, S1 normal, S2 normal, normal heart sounds and intact distal pulses.  Exam reveals no gallop and no friction rub.   No murmur heard. Pulmonary/Chest: Effort normal and breath sounds normal. No respiratory distress. She has no wheezes. She has no rales. She exhibits no tenderness.  Abdominal: Soft. Bowel sounds are normal. She exhibits no distension. There is no tenderness.  Musculoskeletal: Normal range of motion. She exhibits no edema or tenderness.  Lymphadenopathy:    She has no cervical adenopathy.  Neurological: She is alert and oriented to person, place, and time. Coordination normal.  Skin: Skin is warm and dry. No rash noted. No erythema.  Psychiatric: She has a normal mood and affect. Her behavior is normal. Judgment and thought content normal.    Assessment and Plan  Nursing note and vitals reviewed.

## 2015-01-28 NOTE — Assessment & Plan Note (Signed)
Cholesterol is at goal on the current lipid regimen. No changes to the medications were made.  

## 2015-01-28 NOTE — Assessment & Plan Note (Signed)
She continues to have occasional episodes of orthostasis. Recommended she hold her losartan for now

## 2015-03-08 ENCOUNTER — Other Ambulatory Visit: Payer: Self-pay | Admitting: Cardiovascular Disease

## 2015-03-08 DIAGNOSIS — I6523 Occlusion and stenosis of bilateral carotid arteries: Secondary | ICD-10-CM

## 2015-03-12 ENCOUNTER — Ambulatory Visit (INDEPENDENT_AMBULATORY_CARE_PROVIDER_SITE_OTHER): Payer: BC Managed Care – PPO

## 2015-03-12 DIAGNOSIS — I6523 Occlusion and stenosis of bilateral carotid arteries: Secondary | ICD-10-CM | POA: Diagnosis not present

## 2015-04-01 LAB — HM DIABETES EYE EXAM

## 2015-06-03 ENCOUNTER — Other Ambulatory Visit: Payer: Self-pay | Admitting: Family Medicine

## 2015-06-03 DIAGNOSIS — E039 Hypothyroidism, unspecified: Secondary | ICD-10-CM | POA: Insufficient documentation

## 2015-06-03 DIAGNOSIS — E038 Other specified hypothyroidism: Secondary | ICD-10-CM | POA: Insufficient documentation

## 2015-06-03 DIAGNOSIS — I10 Essential (primary) hypertension: Secondary | ICD-10-CM | POA: Insufficient documentation

## 2015-06-03 DIAGNOSIS — E119 Type 2 diabetes mellitus without complications: Secondary | ICD-10-CM | POA: Insufficient documentation

## 2015-06-03 DIAGNOSIS — I2510 Atherosclerotic heart disease of native coronary artery without angina pectoris: Secondary | ICD-10-CM | POA: Insufficient documentation

## 2015-06-03 DIAGNOSIS — E7800 Pure hypercholesterolemia, unspecified: Secondary | ICD-10-CM | POA: Insufficient documentation

## 2015-06-03 DIAGNOSIS — G5600 Carpal tunnel syndrome, unspecified upper limb: Secondary | ICD-10-CM | POA: Insufficient documentation

## 2015-06-03 NOTE — Telephone Encounter (Signed)
Last DM ov 08/20/14 A1c was order, no result in allscript.  Last A1c on 05/10/14 6.7%.

## 2015-07-29 ENCOUNTER — Encounter: Payer: Self-pay | Admitting: Cardiovascular Disease

## 2015-07-29 ENCOUNTER — Ambulatory Visit (INDEPENDENT_AMBULATORY_CARE_PROVIDER_SITE_OTHER): Payer: BC Managed Care – PPO | Admitting: Cardiovascular Disease

## 2015-07-29 VITALS — BP 120/64 | HR 83 | Ht 64.0 in | Wt 159.5 lb

## 2015-07-29 DIAGNOSIS — I951 Orthostatic hypotension: Secondary | ICD-10-CM

## 2015-07-29 DIAGNOSIS — I2510 Atherosclerotic heart disease of native coronary artery without angina pectoris: Secondary | ICD-10-CM

## 2015-07-29 DIAGNOSIS — I6522 Occlusion and stenosis of left carotid artery: Secondary | ICD-10-CM | POA: Diagnosis not present

## 2015-07-29 DIAGNOSIS — E119 Type 2 diabetes mellitus without complications: Secondary | ICD-10-CM

## 2015-07-29 DIAGNOSIS — E785 Hyperlipidemia, unspecified: Secondary | ICD-10-CM

## 2015-07-29 NOTE — Progress Notes (Signed)
Patient ID: Dana Ramirez, female    DOB: 1951/01/22, 65 y.o.   MRN: [id number]  HPI Comments: Dana Ramirez is a very pleasant 65 year old woman with a history of coronary artery disease, bypass surgery in September 2006 with a LIMA to the LAD, vein graft to the diagonal, negative stress test in 2010, moderate left carotid arterial disease 2010,  who presents for routine followup of her coronary artery disease  In follow-up, she denies any symptoms concerning for angina No chest pain or shortness of breath on exertion No regular exercise program She has not been eating well recently, no recent lab work Prior hemoglobin A1c 6.7 in 2015 Losartan held previously for symptoms of orthostasis Since this was held, symptoms have resolved, she has felt better with more energy  She has been taking Crestor 20 mg alternating with 10 mg. Previous lab work showed total cholesterol 134, HDL 69, LDL 46  Cholesterol well-controlled for the past 6-7 years  EKG on today's visit shows normal sinus rhythm with rate 84 bpm, no significant ST or T-wave changes  Other past medical history  prior history of vertigo  Losartan decreased in the past. This helped her lightheaded spells   Cardiac catheterization done September 2006 showed a diffusely diseased LAD from the left main to the midportion with lesion up to 90-95%, 95% ostial diagonal disease.     Allergies  Allergen Reactions  . Codeine   . Lisinopril Cough  . Penicillins     Outpatient Encounter Prescriptions as of 07/29/2015  Medication Sig  . aspirin (ASPIR-LOW) 81 MG EC tablet Take 162 mg by mouth daily.    . metFORMIN (GLUCOPHAGE) 1000 MG tablet Take 1,000 mg by mouth daily with breakfast.  . metoprolol succinate (TOPROL-XL) 25 MG 24 hr tablet Take 25 mg by mouth daily.  . rosuvastatin (CRESTOR) 20 MG tablet Take 1 tablet (20 mg total) by mouth daily.  . [DISCONTINUED] cholecalciferol (VITAMIN D) 1000 UNITS tablet Take 2,000 Units by mouth  daily. Reported on 07/29/2015  . [DISCONTINUED] glucose blood test strip Reported on 07/29/2015  . [DISCONTINUED] losartan (COZAAR) 50 MG tablet Take by mouth. Reported on 07/29/2015  . [DISCONTINUED] metFORMIN (GLUCOPHAGE) 1000 MG tablet TAKE 1 TABLET BY MOUTH TWICE DAILY (Patient not taking: Reported on 07/29/2015)  . [DISCONTINUED] metFORMIN (GLUCOPHAGE) 1000 MG tablet Take 1,000 mg by mouth 2 (two) times daily. Reported on 07/29/2015  . [DISCONTINUED] metFORMIN (GLUCOPHAGE) 500 MG tablet Take 500 mg by mouth daily with breakfast. Reported on 07/29/2015  . [DISCONTINUED] metoprolol succinate (TOPROL-XL) 25 MG 24 hr tablet TAKE 1 TABLET BY MOUTH EVERY DAY (Patient not taking: Reported on 07/29/2015)  . [DISCONTINUED] ondansetron (ZOFRAN) 4 MG tablet Take 1 tablet (4 mg total) by mouth daily as needed for nausea or vomiting. (Patient not taking: Reported on 07/29/2015)  . [DISCONTINUED] oxyCODONE-acetaminophen (ROXICET) 5-325 MG per tablet Take 1 tablet by mouth every 6 (six) hours as needed. (Patient not taking: Reported on 07/29/2015)   No facility-administered encounter medications on file as of 07/29/2015.    Past Medical History  Diagnosis Date  . Carotid arterial disease ([place])   . CAD (coronary artery disease)   . Hyperlipidemia   . Hypertension   . History of vertigo   . Diabetes mellitus without complication ([place])   . Kidney stone     Past Surgical History  Procedure Laterality Date  . Coronary artery bypass graft  03/2005    LIMA to the LAD, vein graft to  diagonal with a negative stress test in 06/2006  . Cholecystectomy    . Cesarean section      x 2  . Tonsillectomy      Social History  reports that she has never smoked. She does not have any smokeless tobacco history on file. She reports that she does not drink alcohol or use illicit drugs.  Family History family history includes Coronary artery disease in her other; Heart disease in her father, maternal grandfather, maternal  grandmother, mother, paternal grandfather, and paternal grandmother; Stroke in her sister.   Review of Systems  Constitutional: Negative.   Respiratory: Negative.   Cardiovascular: Negative.   Gastrointestinal: Negative.   Musculoskeletal: Negative.   Neurological: Negative.   Hematological: Negative.   Psychiatric/Behavioral: Negative.   All other systems reviewed and are negative.   BP 120/64 mmHg  Pulse 83  Ht '5\' 4"'$  (1.626 m)  Wt 159 lb 8 oz (72.349 kg)  BMI 27.36 kg/m2  Physical Exam  Constitutional: She is oriented to person, place, and time. She appears well-developed and well-nourished.  HENT:  Head: Normocephalic.  Nose: Nose normal.  Mouth/Throat: Oropharynx is clear and moist.  Eyes: Conjunctivae are normal. Pupils are equal, round, and reactive to light.  Neck: Normal range of motion. Neck supple. No JVD present.  Cardiovascular: Normal rate, regular rhythm, S1 normal, S2 normal, normal heart sounds and intact distal pulses.  Exam reveals no gallop and no friction rub.   No murmur heard. Pulmonary/Chest: Effort normal and breath sounds normal. No respiratory distress. She has no wheezes. She has no rales. She exhibits no tenderness.  Abdominal: Soft. Bowel sounds are normal. She exhibits no distension. There is no tenderness.  Musculoskeletal: Normal range of motion. She exhibits no edema or tenderness.  Lymphadenopathy:    She has no cervical adenopathy.  Neurological: She is alert and oriented to person, place, and time. Coordination normal.  Skin: Skin is warm and dry. No rash noted. No erythema.  Psychiatric: She has a normal mood and affect. Her behavior is normal. Judgment and thought content normal.    Assessment and Plan  Nursing note and vitals reviewed.

## 2015-07-29 NOTE — Patient Instructions (Signed)
You are doing well. No medication changes were made.  Please call us if you have new issues that need to be addressed before your next appt.  Your physician wants you to follow-up in: 6 months.  You will receive a reminder letter in the mail two months in advance. If you don't receive a letter, please call our office to schedule the follow-up appointment.   

## 2015-07-29 NOTE — Assessment & Plan Note (Signed)
We have encouraged continued exercise, careful diet management in an effort to lose weight.  she will follow-up with Dr. Venia Minks for routine lab work

## 2015-07-29 NOTE — Assessment & Plan Note (Signed)
Cholesterol is at goal on the current lipid regimen. No changes to the medications were made.  

## 2015-07-29 NOTE — Assessment & Plan Note (Signed)
Less than 39% bilateral carotid arterial disease  repeat in several years time

## 2015-07-29 NOTE — Assessment & Plan Note (Signed)
Currently with no symptoms of angina. No further workup at this time. Continue current medication regimen. 

## 2015-07-29 NOTE — Assessment & Plan Note (Signed)
Symptoms resolved by holding her losartan  Encouraged her to stay hydrated

## 2015-08-02 ENCOUNTER — Ambulatory Visit (INDEPENDENT_AMBULATORY_CARE_PROVIDER_SITE_OTHER): Payer: BC Managed Care – PPO | Admitting: Family Medicine

## 2015-08-02 ENCOUNTER — Encounter: Payer: Self-pay | Admitting: Family Medicine

## 2015-08-02 VITALS — BP 108/64 | HR 74 | Temp 98.1°F | Resp 16 | Ht 64.0 in | Wt 158.0 lb

## 2015-08-02 DIAGNOSIS — Z23 Encounter for immunization: Secondary | ICD-10-CM

## 2015-08-02 DIAGNOSIS — E039 Hypothyroidism, unspecified: Secondary | ICD-10-CM

## 2015-08-02 DIAGNOSIS — E785 Hyperlipidemia, unspecified: Secondary | ICD-10-CM | POA: Diagnosis not present

## 2015-08-02 DIAGNOSIS — E119 Type 2 diabetes mellitus without complications: Secondary | ICD-10-CM

## 2015-08-02 DIAGNOSIS — E038 Other specified hypothyroidism: Secondary | ICD-10-CM

## 2015-08-02 DIAGNOSIS — Z124 Encounter for screening for malignant neoplasm of cervix: Secondary | ICD-10-CM | POA: Diagnosis not present

## 2015-08-02 DIAGNOSIS — I10 Essential (primary) hypertension: Secondary | ICD-10-CM

## 2015-08-02 DIAGNOSIS — Z1211 Encounter for screening for malignant neoplasm of colon: Secondary | ICD-10-CM | POA: Diagnosis not present

## 2015-08-02 DIAGNOSIS — Z0000 Encounter for general adult medical examination without abnormal findings: Secondary | ICD-10-CM | POA: Diagnosis not present

## 2015-08-02 LAB — IFOBT (OCCULT BLOOD): IFOBT: NEGATIVE

## 2015-08-02 LAB — POCT URINALYSIS DIPSTICK
BILIRUBIN UA: NEGATIVE
Blood, UA: NEGATIVE
Glucose, UA: NEGATIVE
KETONES UA: NEGATIVE
LEUKOCYTES UA: NEGATIVE
Nitrite, UA: NEGATIVE
PH UA: 5
Protein, UA: NEGATIVE
Spec Grav, UA: 1.025
Urobilinogen, UA: 0.2

## 2015-08-02 NOTE — Progress Notes (Signed)
Patient ID: Dana Ramirez, female   DOB: 1950-08-01, 65 y.o.   MRN: [id number]       Patient: Dana Ramirez, Female    DOB: 1951/06/10, 65 y.o.   MRN: [id number] Visit Date: 08/02/2015  Today's Provider: Margarita Rana, MD   Chief Complaint  Patient presents with  . Annual Exam   Subjective:    Annual physical exam Dana Ramirez is a 65 y.o. female who presents today for health maintenance and complete physical. She feels well. She reports exercising none. She reports she is sleeping well. 06/01/12 CPE 05/21/10 Pap-neg 02/08/14 Mammo-BI-RADS 1 07/11/07 Colon-normal  Lab Results  Component Value Date   WBC 8.4 12/29/2014   HGB 13.3 12/29/2014   HCT 40.1 12/29/2014   PLT 206 12/29/2014   GLUCOSE 251* 12/29/2014   CHOL 132 01/25/2014   TRIG 95 01/25/2014   HDL 71 01/25/2014   LDLCALC 42 01/25/2014   ALT 15 12/29/2014   AST 23 12/29/2014   NA 139 12/29/2014   K 3.9 12/29/2014   CL 105 12/29/2014   CREATININE 0.84 12/29/2014   BUN 27* 12/29/2014   CO2 26 12/29/2014   -----------------------------------------------------------------  Diabetes Mellitus Type II, Follow-up:   No results found for: HGBA1C Last A1c 05/10/14 6.7%. Last seen for diabetes 11 months ago.  Management since then includes ordered labs. A1c ordered on 08/20/2014, no results found. She reports good compliance with treatment. She is having side effects. Abdominal pain Current symptoms include none and have been stable. Home blood sugar records: not being checked  Episodes of hypoglycemia? no   Current Insulin Regimen: none Most Recent Eye Exam: up to date per pt Weight trend: stable Prior visit with dietician: no Current diet: in general, a "healthy" diet   Current exercise: none  Pertinent Labs:    Component Value Date/Time   CHOL 132 01/25/2014 1002   CHOL 132 01/08/2011 0937   TRIG 95 01/25/2014 1002   TRIG 112 08/20/2008   HDL 71 01/25/2014 1002   HDL 61  01/08/2011 0937   LDLCALC 42 01/25/2014 1002   LDLCALC 51 01/08/2011 0937   LDLCALC 59 08/20/2008   CREATININE 0.84 12/29/2014 1117    Wt Readings from Last 3 Encounters:  08/02/15 158 lb (71.668 kg)  07/29/15 159 lb 8 oz (72.349 kg)  01/28/15 155 lb 8 oz (70.534 kg)    ------------------------------------------------------------------------     Review of Systems  Constitutional: Negative.   HENT: Negative.   Eyes: Negative.   Respiratory: Negative.   Cardiovascular: Negative.   Gastrointestinal: Negative.   Endocrine: Negative.   Genitourinary: Positive for urgency, enuresis and vaginal pain.  Musculoskeletal: Positive for arthralgias.  Skin: Negative.   Allergic/Immunologic: Negative.   Neurological: Negative.   Hematological: Negative.   Psychiatric/Behavioral: Negative.     Social History      She  reports that she has never smoked. She has never used smokeless tobacco. She reports that she does not drink alcohol or use illicit drugs.       Social History   Social History  . Marital Status: Married    Spouse Name: N/A  . Number of Children: N/A  . Years of Education: N/A   Occupational History  . Full time Other    [employer]   Social History Main Topics  . Smoking status: Never Smoker   . Smokeless tobacco: Never Used  . Alcohol Use: No  . Drug Use: No  . Sexual Activity: Not Asked  Other Topics Concern  . None   Social History Narrative   Married   Gets regular exercise, once or twice every week    Past Medical History  Diagnosis Date  . Carotid arterial disease ([place])   . CAD (coronary artery disease)   . Hyperlipidemia   . Hypertension   . History of vertigo   . Diabetes mellitus without complication ([place])   . Kidney stone      Patient Active Problem List   Diagnosis Date Noted  . Carpal tunnel syndrome 06/03/2015  . Atherosclerosis of coronary artery 06/03/2015  . BP (high blood pressure) 06/03/2015  . Hypercholesteremia 06/03/2015    . Subclinical hypothyroidism 06/03/2015  . Diabetes mellitus without complication ([place]) 42/70/6237  . Carotid stenosis 12/21/2012  . Orthostatic hypotension 12/21/2012  . Hyperlipidemia 12/19/2009  . CAD, NATIVE VESSEL 12/19/2009    Past Surgical History  Procedure Laterality Date  . 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 Cholecystectomy    . Cesarean section      x 2  . Tonsillectomy      Family History        Family Status  Relation Status Death Age  . Mother Deceased 67    stroke  . Father Alive   . Sister Alive   . Sister Alive   . Brother Alive   . Brother Alive         Her family history includes Coronary artery disease in her other; Diabetes in her brother; Heart disease in her father, maternal grandfather, maternal grandmother, mother, paternal grandfather, paternal grandmother, and sister; Stroke in her sister.    Allergies  Allergen Reactions  . Codeine   . Lisinopril Cough  . Penicillins     Previous Medications   ASPIRIN (ASPIR-LOW) 81 MG EC TABLET    Take 162 mg by mouth daily.     METFORMIN (GLUCOPHAGE) 1000 MG TABLET    Take 1,000 mg by mouth daily with breakfast.   METOPROLOL SUCCINATE (TOPROL-XL) 25 MG 24 HR TABLET    Take 25 mg by mouth daily.   ROSUVASTATIN (CRESTOR) 20 MG TABLET    Take 1 tablet (20 mg total) by mouth daily.    Patient Care Team: Margarita Rana, MD as PCP - General (Family Medicine) Minna Merritts, MD (Cardiology)     Objective:   Vitals: BP 108/64 mmHg  Pulse 74  Temp(Src) 98.1 F (36.7 C) (Oral)  Resp 16  Ht '5\' 4"'$  (1.626 m)  Wt 158 lb (71.668 kg)  BMI 27.11 kg/m2  SpO2 96%   Physical Exam  Constitutional: She is oriented to person, place, and time. She appears well-developed and well-nourished.  HENT:  Head: Normocephalic and atraumatic.  Right Ear: Tympanic membrane, external ear and ear canal normal.  Left Ear: Tympanic  membrane, external ear and ear canal normal.  Nose: Nose normal.  Mouth/Throat: Uvula is midline, oropharynx is clear and moist and mucous membranes are normal.  Eyes: Conjunctivae, EOM and lids are normal. Pupils are equal, round, and reactive to light.  Neck: Trachea normal and normal range of motion. Neck supple. Carotid bruit is not present. No thyroid mass and no thyromegaly present.  Cardiovascular: Normal rate, regular rhythm and normal heart sounds.   Pulmonary/Chest: Effort normal and breath sounds normal.  Abdominal: Soft. Normal appearance and bowel sounds are normal. There is no hepatosplenomegaly. There  is no tenderness.  Genitourinary: Vagina normal. Guaiac negative stool. No breast swelling, tenderness or discharge. No vaginal discharge found.  Musculoskeletal: Normal range of motion.  Lymphadenopathy:    She has no cervical adenopathy.    She has no axillary adenopathy.  Neurological: She is alert and oriented to person, place, and time. She has normal strength. No cranial nerve deficit.  Skin: Skin is warm, dry and intact.  Psychiatric: She has a normal mood and affect. Her speech is normal and behavior is normal. Judgment and thought content normal. Cognition and memory are normal.     Depression Screen PHQ 2/9 Scores 08/02/2015  PHQ - 2 Score 0      Assessment & Plan:     Routine Health Maintenance and Physical Exam  Exercise Activities and Dietary recommendations Goals    . Exercise 150 minutes per week (moderate activity)      1. Annual physical exam As above.  Eat healthy and increase exercise.   - POCT urinalysis dipstick Results for orders placed or performed in visit on 08/02/15  POCT urinalysis dipstick  Result Value Ref Range   Color, UA yellow    Clarity, UA clear    Glucose, UA neg    Bilirubin, UA neg    Ketones, UA neg    Spec Grav, UA 1.025    Blood, UA neg    pH, UA 5.0    Protein, UA neg    Urobilinogen, UA 0.2    Nitrite, UA neg      Leukocytes, UA Negative Negative  IFOBT POC (occult bld, rslt in office)  Result Value Ref Range   IFOBT Negative     2. Diabetes mellitus without complication ([place]) Not at goal. Patient has not been monitoring like she should.  Stressed importance of better compliance and monitoring of sugar. Most important thing to treat to prevent heart disease.    - Hemoglobin A1c  3. Cervical cancer screening Performed today.  - Pap IG and HPV (high risk) DNA detection  4. Need for influenza vaccination Given today.  - Flu Vaccine QUAD 36+ mos IM  5. Need for pneumococcal vaccination Given today.  - Pneumococcal polysaccharide vaccine 23-valent greater than or equal to 2yo subcutaneous/IM  6. Colon cancer screening OC light negatvie.  - IFOBT POC (occult bld, rslt in office)  7. Essential hypertension Condition is stable. Please continue current medication and  plan of care as noted.   - CBC with Differential/Platelet - Comprehensive metabolic panel  8. Subclinical hypothyroidism Check labs.  - TSH  9. Hyperlipidemia Check labs. Continue current medication.    - Lipid Panel With LDL/HDL Ratio   Patient was seen and examined by Jerrell Belfast, MD, and note scribed by Lynford Humphrey, [place].   I have reviewed the document for accuracy and completeness and I agree with above. Jerrell Belfast, MD   Margarita Rana, MD   --------------------------------------------------------------------

## 2015-08-06 ENCOUNTER — Telehealth: Payer: Self-pay

## 2015-08-06 DIAGNOSIS — E119 Type 2 diabetes mellitus without complications: Secondary | ICD-10-CM

## 2015-08-06 LAB — CBC WITH DIFFERENTIAL/PLATELET
BASOS ABS: 0 10*3/uL (ref 0.0–0.2)
Basos: 0 %
EOS (ABSOLUTE): 0.1 10*3/uL (ref 0.0–0.4)
Eos: 4 %
Hematocrit: 40 % (ref 34.0–46.6)
Hemoglobin: 13.5 g/dL (ref 11.1–15.9)
IMMATURE GRANS (ABS): 0 10*3/uL (ref 0.0–0.1)
Immature Granulocytes: 0 %
LYMPHS: 33 %
Lymphocytes Absolute: 1.2 10*3/uL (ref 0.7–3.1)
MCH: 30.8 pg (ref 26.6–33.0)
MCHC: 33.8 g/dL (ref 31.5–35.7)
MCV: 91 fL (ref 79–97)
Monocytes Absolute: 0.4 10*3/uL (ref 0.1–0.9)
Monocytes: 12 %
Neutrophils Absolute: 1.9 10*3/uL (ref 1.4–7.0)
Neutrophils: 51 %
PLATELETS: 191 10*3/uL (ref 150–379)
RBC: 4.38 x10E6/uL (ref 3.77–5.28)
RDW: 12.5 % (ref 12.3–15.4)
WBC: 3.7 10*3/uL (ref 3.4–10.8)

## 2015-08-06 LAB — LIPID PANEL WITH LDL/HDL RATIO
CHOLESTEROL TOTAL: 124 mg/dL (ref 100–199)
HDL: 66 mg/dL (ref >39)
LDL CALC: 40 mg/dL (ref 0–99)
LDl/HDL Ratio: 0.6 ratio units (ref 0.0–3.2)
Triglycerides: 91 mg/dL (ref 0–149)
VLDL CHOLESTEROL CAL: 18 mg/dL (ref 5–40)

## 2015-08-06 LAB — COMPREHENSIVE METABOLIC PANEL
ALT: 22 IU/L (ref 0–32)
AST: 19 IU/L (ref 0–40)
Albumin/Globulin Ratio: 1.7 (ref 1.1–2.5)
Albumin: 4.2 g/dL (ref 3.6–4.8)
Alkaline Phosphatase: 106 IU/L (ref 39–117)
BUN/Creatinine Ratio: 25 (ref 11–26)
BUN: 17 mg/dL (ref 8–27)
Bilirubin Total: 0.3 mg/dL (ref 0.0–1.2)
CALCIUM: 10 mg/dL (ref 8.7–10.3)
CO2: 26 mmol/L (ref 18–29)
Chloride: 100 mmol/L (ref 96–106)
Creatinine, Ser: 0.69 mg/dL (ref 0.57–1.00)
GFR, EST AFRICAN AMERICAN: 107 mL/min/{1.73_m2} (ref >59)
GFR, EST NON AFRICAN AMERICAN: 93 mL/min/{1.73_m2} (ref >59)
GLUCOSE: 174 mg/dL — AB (ref 65–99)
Globulin, Total: 2.5 g/dL (ref 1.5–4.5)
Potassium: 4.8 mmol/L (ref 3.5–5.2)
Sodium: 143 mmol/L (ref 134–144)
TOTAL PROTEIN: 6.7 g/dL (ref 6.0–8.5)

## 2015-08-06 LAB — TSH: TSH: 5.02 u[IU]/mL — ABNORMAL HIGH (ref 0.450–4.500)

## 2015-08-06 LAB — HEMOGLOBIN A1C
Est. average glucose Bld gHb Est-mCnc: 197 mg/dL
Hgb A1c MFr Bld: 8.5 % — ABNORMAL HIGH (ref 4.8–5.6)

## 2015-08-06 MED ORDER — SITAGLIPTIN PHOSPHATE 100 MG PO TABS: 100.0000 mg | ORAL_TABLET | Freq: Every day | ORAL | Status: DC

## 2015-08-06 MED ORDER — GLIPIZIDE ER 2.5 MG PO TB24: 2.5000 mg | ORAL_TABLET | Freq: Every day | ORAL | Status: DC

## 2015-08-06 NOTE — Telephone Encounter (Signed)
Pt advised.  She agreed to start Januvia and Glipizide.  Pt send RX to [facility].   Apt made for 09/16/2015.    Thanks,   -Mickel Baas

## 2015-08-06 NOTE — Telephone Encounter (Signed)
-----   Message from Margarita Rana, MD sent at 08/06/2015  6:33 AM EST ----- Thyroid still borderline. Blood sugar much too high at 8.5. Please see if patient checking her sugars more regularly. Would recommend  Add Januvia 100 mg and Glipizide XR.   Would need to monitor sugars more regularly and follow up in 6 weeks to review how patient is doing. Thanks.

## 2015-08-07 ENCOUNTER — Other Ambulatory Visit: Payer: Self-pay | Admitting: Family Medicine

## 2015-08-07 DIAGNOSIS — Z1231 Encounter for screening mammogram for malignant neoplasm of breast: Secondary | ICD-10-CM

## 2015-08-08 ENCOUNTER — Telehealth: Payer: Self-pay

## 2015-08-08 LAB — PAP IG AND HPV HIGH-RISK
HPV, high-risk: NEGATIVE
PAP Smear Comment: 0

## 2015-08-08 NOTE — Telephone Encounter (Signed)
Pt advised.   Thanks,   -Raedyn Wenke  

## 2015-08-08 NOTE — Telephone Encounter (Signed)
-----   Message from Margarita Rana, MD sent at 08/08/2015  6:49 AM EST ----- Pap is normal. Please notify patient.

## 2015-08-12 ENCOUNTER — Telehealth: Payer: Self-pay

## 2015-08-12 NOTE — Telephone Encounter (Signed)
Pt has question about Januvia. States the insert advised her to not take medication of she has a h/o gallbladder stones or kidney stones. Pt has had h/o both. Had gallbladder removed. Pt would like to know if it is safe to take this medication. Please advise. CB# [phone]. Renaldo Fiddler, CMA

## 2015-08-12 NOTE — Telephone Encounter (Signed)
Yes. Is safe to take. Thanks.

## 2015-08-13 NOTE — Telephone Encounter (Signed)
Pt advised.   Thanks,   -Laura  

## 2015-08-15 ENCOUNTER — Ambulatory Visit
Admission: RE | Admit: 2015-08-15 | Discharge: 2015-08-15 | Disposition: A | Discharge status: Home / Self Care | Payer: BC Managed Care – PPO | Source: Ambulatory Visit | Attending: Family Medicine | Admitting: Family Medicine

## 2015-08-15 DIAGNOSIS — Z1231 Encounter for screening mammogram for malignant neoplasm of breast: Secondary | ICD-10-CM | POA: Insufficient documentation

## 2015-09-07 ENCOUNTER — Other Ambulatory Visit: Payer: Self-pay | Admitting: Cardiovascular Disease

## 2015-09-16 ENCOUNTER — Ambulatory Visit (INDEPENDENT_AMBULATORY_CARE_PROVIDER_SITE_OTHER): Payer: BC Managed Care – PPO | Admitting: Family Medicine

## 2015-09-16 ENCOUNTER — Encounter: Payer: Self-pay | Admitting: Family Medicine

## 2015-09-16 VITALS — BP 118/72 | HR 64 | Temp 98.6°F | Resp 16 | Wt 157.0 lb

## 2015-09-16 DIAGNOSIS — E119 Type 2 diabetes mellitus without complications: Secondary | ICD-10-CM | POA: Diagnosis not present

## 2015-09-16 NOTE — Progress Notes (Signed)
Patient ID: Dana Ramirez, female   DOB: [birth year], 65 y.o.   MRN: [id number]         Patient: Dana Ramirez Female    DOB: February 27, 1951   65 y.o.   MRN: [id number] Visit Date: 09/16/2015  Today's Provider: Margarita Rana, MD   Chief Complaint  Patient presents with  . Diabetes   Subjective:    Diabetes Hypoglycemia symptoms include dizziness (History of vertigo.). Pertinent negatives for hypoglycemia include no headaches.    Diabetes Mellitus Type II, Follow-up:   Lab Results  Component Value Date   HGBA1C 8.5* 08/05/2015   Last seen for diabetes 6 weeks ago.  Management since then includes Started Januvia, and Glipizide. She reports excellent compliance with treatment. She is having side effects. Pt reporting having trouble with heartburn to the point of vomiting when she first started the two new medications.  She has started Prilosec OTC and reports her heartburn is much better. Current symptoms include none and have been stable. Home blood sugar records: fasting range: 90-100's; Overall 120's  Episodes of hypoglycemia? no   Most Recent Eye Exam: 03/2015 Weight trend: stable Current diet: in general, a "healthy" diet   Current exercise: walking  Pt walks about a mile a day.  ------------------------------------------------------------------------           Allergies  Allergen Reactions  . Codeine   . Lisinopril Cough  . Penicillins    Previous Medications   ASPIRIN (ASPIR-LOW) 81 MG EC TABLET    Take 162 mg by mouth daily.     GLIPIZIDE (GLUCOTROL XL) 2.5 MG 24 HR TABLET    Take 1 tablet (2.5 mg total) by mouth daily with breakfast.   METFORMIN (GLUCOPHAGE) 1000 MG TABLET    Take 1,000 mg by mouth daily with breakfast.   METOPROLOL SUCCINATE (TOPROL-XL) 25 MG 24 HR TABLET    TAKE 1 TABLET DAILY   ROSUVASTATIN (CRESTOR) 20 MG TABLET    Take 1 tablet (20 mg total) by mouth daily.   SITAGLIPTIN (JANUVIA) 100 MG TABLET    Take 1 tablet (100 mg  total) by mouth daily.    Review of Systems  Constitutional: Negative.   Respiratory: Negative.   Cardiovascular: Negative.   Gastrointestinal: Negative.   Endocrine: Negative.   Musculoskeletal: Negative.   Neurological: Positive for dizziness (History of vertigo.). Negative for light-headedness and headaches.    Social History  Substance Use Topics  . Smoking status: Never Smoker   . Smokeless tobacco: Never Used  . Alcohol Use: No   Objective:   BP 118/72 mmHg  Pulse 64  Temp(Src) 98.6 F (37 C) (Oral)  Resp 16  Wt 157 lb (71.215 kg)  Physical Exam  Constitutional: She is oriented to person, place, and time. She appears well-developed and well-nourished.  Neurological: She is alert and oriented to person, place, and time.  Psychiatric: She has a normal mood and affect. Her behavior is normal. Judgment and thought content normal.       Assessment & Plan:     1. Diabetes mellitus without complication (HCC) Stable  Improved.  Continue current plan of care.  Recheck in 3 months.     Patient was seen and examined by Jerrell Belfast, MD, and note scribed by Ashley Royalty, CMA.  I have reviewed the document for accuracy and completeness and I agree with above. - Jerrell Belfast, MD   Margarita Rana, MD  [facility]

## 2015-10-09 ENCOUNTER — Other Ambulatory Visit: Payer: Self-pay | Admitting: Family Medicine

## 2015-10-09 DIAGNOSIS — E119 Type 2 diabetes mellitus without complications: Secondary | ICD-10-CM

## 2015-11-29 ENCOUNTER — Encounter: Payer: Self-pay | Admitting: Family Medicine

## 2015-11-29 ENCOUNTER — Ambulatory Visit (INDEPENDENT_AMBULATORY_CARE_PROVIDER_SITE_OTHER): Payer: BC Managed Care – PPO | Admitting: Family Medicine

## 2015-11-29 VITALS — BP 122/72 | HR 72 | Temp 97.9°F | Resp 16 | Wt 154.0 lb

## 2015-11-29 DIAGNOSIS — E119 Type 2 diabetes mellitus without complications: Secondary | ICD-10-CM | POA: Diagnosis not present

## 2015-11-29 DIAGNOSIS — R05 Cough: Secondary | ICD-10-CM

## 2015-11-29 DIAGNOSIS — R059 Cough, unspecified: Secondary | ICD-10-CM

## 2015-11-29 LAB — POCT UA - MICROALBUMIN: Microalbumin Ur, POC: 20 mg/L

## 2015-11-29 LAB — POCT GLYCOSYLATED HEMOGLOBIN (HGB A1C)
Est. average glucose Bld gHb Est-mCnc: 154
Hemoglobin A1C: 7

## 2015-11-29 NOTE — Progress Notes (Signed)
Subjective:    Patient ID: Dana Ramirez, female    DOB: [birth year], 65 y.o.   MRN: [id number]  Diabetes She presents for her follow-up (Last A1C was 01/1682017 and was 8.5%) diabetic visit. She has type 2 diabetes mellitus. Hypoglycemia symptoms include tremors (and weakness). (Has happened twice since starting Glipizide and Januvia; has not happened since.) Pertinent negatives for diabetes include no blurred vision, no chest pain, no fatigue, no foot paresthesias, no foot ulcerations, no polydipsia, no polyphagia, no polyuria, no visual change and no weakness. Symptoms are stable. Risk factors for coronary artery disease include diabetes mellitus, hypertension, dyslipidemia, family history and post-menopausal (pt sees cardiology). Current diabetic treatment includes oral agent (triple therapy) (Metformin 1000 mg, Glipizide 2.5 mg, Januvia 100 mg). She is compliant with treatment all of the time. She is following a generally healthy diet. Exercise: Has been busy in the last 2 months remodeling, etc, and has not had time for structured exercise. (FBS are typically <100.) An ACE inhibitor/angiotensin II receptor blocker is not being taken. Eye exam is current.   ALso has occasional dry cough. No weight loss. No fevers. Feels a little wheeze on the right at times.    Review of Systems  Constitutional: Negative for fatigue.  Eyes: Negative for blurred vision.  Respiratory: Positive for cough (dry x 2 weeks).   Cardiovascular: Negative for chest pain.  Endocrine: Negative for polydipsia, polyphagia and polyuria.  Neurological: Positive for tremors (and weakness). Negative for weakness.   BP 122/72 mmHg  Pulse 72  Temp(Src) 97.9 F (36.6 C) (Oral)  Resp 16  Wt 154 lb (69.854 kg)   Patient Active Problem List   Diagnosis Date Noted  . Carpal tunnel syndrome 06/03/2015  . Atherosclerosis of coronary artery 06/03/2015  . BP (high blood pressure) 06/03/2015  . Hypercholesteremia  06/03/2015  . Subclinical hypothyroidism 06/03/2015  . Diabetes mellitus without complication ([place]) 67/20/9470  . Carotid stenosis 12/21/2012  . Orthostatic hypotension 12/21/2012  . Hyperlipidemia 12/19/2009  . CAD, NATIVE VESSEL 12/19/2009   Past Medical History  Diagnosis Date  . Carotid arterial disease ([place])   . CAD (coronary artery disease)   . Hyperlipidemia   . Hypertension   . History of vertigo   . Diabetes mellitus without complication ([place])   . Kidney stone    Current Outpatient Prescriptions on File Prior to Visit  Medication Sig  . aspirin (ASPIR-LOW) 81 MG EC tablet Take 162 mg by mouth daily.    Marland Kitchen GLIPIZIDE XL 2.5 MG 24 hr tablet TAKE 1 TABLET (2.5 MG TOTAL) BY MOUTH DAILY WITH BREAKFAST.  Marland Kitchen JANUVIA 100 MG tablet TAKE 1 TABLET (100 MG TOTAL) BY MOUTH DAILY.  . metFORMIN (GLUCOPHAGE) 1000 MG tablet Take 1,000 mg by mouth daily with breakfast.  . metoprolol succinate (TOPROL-XL) 25 MG 24 hr tablet TAKE 1 TABLET DAILY  . rosuvastatin (CRESTOR) 20 MG tablet Take 1 tablet (20 mg total) by mouth daily.   No current facility-administered medications on file prior to visit.   Allergies  Allergen Reactions  . Codeine   . Lisinopril Cough  . Penicillins    Past Surgical History  Procedure Laterality Date  . 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 Cholecystectomy    . Cesarean section      x 2  . Tonsillectomy    . Breast cyst aspiration Left years ago  Social History   Social History  . Marital Status: Married    Spouse Name: N/A  . Number of Children: N/A  . Years of Education: N/A   Occupational History  . Full time Other    [employer]   Social History Main Topics  . Smoking status: Never Smoker   . Smokeless tobacco: Never Used  . Alcohol Use: No  . Drug Use: No  . Sexual Activity: Not on file   Other Topics Concern  . Not on file   Social History Narrative    Married   Gets regular exercise, once or twice every week   Family History  Problem Relation Age of Onset  . Coronary artery disease Other     Strong family history of CAD and CABG  . Heart disease Mother   . Heart disease Father   . Stroke Sister   . Heart disease Maternal Grandmother   . Heart disease Maternal Grandfather   . Heart disease Paternal Grandmother   . Breast cancer Paternal Grandmother 15  . Heart disease Paternal Grandfather   . Heart disease Sister   . Diabetes Brother       Objective:   Physical Exam  Constitutional: She appears well-developed and well-nourished.  HENT:  Head: Normocephalic and atraumatic.  Right Ear: Tympanic membrane normal.  Left Ear: Tympanic membrane normal.  Nose: Nose normal.  Mouth/Throat: Oropharynx is clear and moist.  Cardiovascular: Normal rate and regular rhythm.   Pulmonary/Chest: Effort normal and breath sounds normal.  Psychiatric: She has a normal mood and affect. Her behavior is normal.  BP 122/72 mmHg  Pulse 72  Temp(Src) 97.9 F (36.6 C) (Oral)  Resp 16  Wt 154 lb (69.854 kg)     Assessment & Plan:  1. Diabetes mellitus without complication ([place]) Stable/improving. Continue current medications and plan of care. - POCT glycosylated hemoglobin (Hb A1C) - POCT UA - Microalbumin  Results for orders placed or performed in visit on 11/29/15  POCT glycosylated hemoglobin (Hb A1C)  Result Value Ref Range   Hemoglobin A1C 7.0    Est. average glucose Bld gHb Est-mCnc 154   POCT UA - Microalbumin  Result Value Ref Range   Microalbumin Ur, POC 20 mg/L   2. Cough New problem. Exam normal, bur pt reports she heard wheezes. Will check CXR. Follow up with new provider if does not resolve.   - DG Chest 2 View; Future    Patient seen and examined by Jerrell Belfast, MD, and note scribed by Renaldo Fiddler, CMA.  I have reviewed the document for accuracy and completeness and I agree with above. Jerrell Belfast, MD    Margarita Rana, MD

## 2015-12-26 ENCOUNTER — Ambulatory Visit (INDEPENDENT_AMBULATORY_CARE_PROVIDER_SITE_OTHER): Payer: BC Managed Care – PPO | Admitting: Internal Medicine

## 2015-12-26 ENCOUNTER — Encounter: Payer: Self-pay | Admitting: Internal Medicine

## 2015-12-26 VITALS — BP 130/74 | HR 62 | Temp 98.3°F | Resp 12 | Ht 63.75 in | Wt 154.2 lb

## 2015-12-26 DIAGNOSIS — R05 Cough: Secondary | ICD-10-CM

## 2015-12-26 DIAGNOSIS — E038 Other specified hypothyroidism: Secondary | ICD-10-CM

## 2015-12-26 DIAGNOSIS — I2510 Atherosclerotic heart disease of native coronary artery without angina pectoris: Secondary | ICD-10-CM

## 2015-12-26 DIAGNOSIS — E039 Hypothyroidism, unspecified: Secondary | ICD-10-CM

## 2015-12-26 DIAGNOSIS — E785 Hyperlipidemia, unspecified: Secondary | ICD-10-CM | POA: Diagnosis not present

## 2015-12-26 DIAGNOSIS — Z79899 Other long term (current) drug therapy: Secondary | ICD-10-CM | POA: Diagnosis not present

## 2015-12-26 DIAGNOSIS — E119 Type 2 diabetes mellitus without complications: Secondary | ICD-10-CM

## 2015-12-26 DIAGNOSIS — I10 Essential (primary) hypertension: Secondary | ICD-10-CM | POA: Diagnosis not present

## 2015-12-26 DIAGNOSIS — R059 Cough, unspecified: Secondary | ICD-10-CM

## 2015-12-26 LAB — COMPREHENSIVE METABOLIC PANEL
ALBUMIN: 4.4 g/dL (ref 3.5–5.2)
ALK PHOS: 96 U/L (ref 39–117)
ALT: 10 U/L (ref 0–35)
AST: 14 U/L (ref 0–37)
BUN: 20 mg/dL (ref 6–23)
CALCIUM: 10 mg/dL (ref 8.4–10.5)
CHLORIDE: 103 meq/L (ref 96–112)
CO2: 30 mEq/L (ref 19–32)
Creatinine, Ser: 0.6 mg/dL (ref 0.40–1.20)
GFR: 107.14 mL/min (ref >60.00)
Glucose, Bld: 86 mg/dL (ref 70–99)
POTASSIUM: 5 meq/L (ref 3.5–5.1)
Sodium: 140 mEq/L (ref 135–145)
Total Bilirubin: 0.3 mg/dL (ref 0.2–1.2)
Total Protein: 7.5 g/dL (ref 6.0–8.3)

## 2015-12-26 LAB — LDL CHOLESTEROL, DIRECT: LDL DIRECT: 48 mg/dL

## 2015-12-26 NOTE — Progress Notes (Signed)
Subjective:  Patient ID: Dana Ramirez, female    DOB: 01-May-1951  Age: 65 y.o. MRN: [id number]  CC: The primary encounter diagnosis was Atherosclerosis of native coronary artery of native heart without angina pectoris. Diagnoses of Hyperlipidemia, Long-term use of high-risk medication, Essential hypertension, Subclinical hypothyroidism, Diabetes mellitus without complication ([place]), and Cough were also pertinent to this visit.  HPI Madi Bonfiglio presents for transfer of care, referred by family member Shanon Brow and Al Pimple.  Previous PCP Margarita Rana.  Last seen in may by NM for diabetes follow up  With  improvement in A1c noted from 8. 6 to 7.0  . No lows since February   Due for eye exam in [place]    CABG  2 vessel,  2006 sees [place] every 6 months .  She is asympomatic with exercise.    Foot exam normal    Has recurrent cough.  Remodeling house,  And lots of dust in the air .  Tickle,  No shortness of breath ,  No orthopnea.    History Dia has a past medical history of Carotid arterial disease ([place]); CAD (coronary artery disease); Hyperlipidemia; Hypertension; History of vertigo; Diabetes mellitus without complication ([place]); and Kidney stone.   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   Her family history includes Breast cancer (age of onset: 25) in her paternal grandmother; Coronary artery disease in her other; Diabetes in her brother; Heart disease in her maternal grandfather, maternal grandmother, mother, paternal grandfather, paternal grandmother, and sister; Heart disease (age of onset: 8) in her father; Stroke (age of onset: 70) in her sister.She reports that she has never smoked. She has never used smokeless tobacco. She reports that she does not drink alcohol or use illicit drugs.  Outpatient Prescriptions Prior to  Visit  Medication Sig Dispense Refill  . aspirin (ASPIR-LOW) 81 MG EC tablet Take 162 mg by mouth daily.      Marland Kitchen GLIPIZIDE XL 2.5 MG 24 hr tablet TAKE 1 TABLET (2.5 MG TOTAL) BY MOUTH DAILY WITH BREAKFAST. 30 tablet 5  . JANUVIA 100 MG tablet TAKE 1 TABLET (100 MG TOTAL) BY MOUTH DAILY. 30 tablet 5  . metFORMIN (GLUCOPHAGE) 1000 MG tablet Take 1,000 mg by mouth daily with breakfast.    . metoprolol succinate (TOPROL-XL) 25 MG 24 hr tablet TAKE 1 TABLET DAILY 90 tablet 3  . rosuvastatin (CRESTOR) 20 MG tablet Take 1 tablet (20 mg total) by mouth daily. 90 tablet 3   No facility-administered medications prior to visit.    Review of Systems:  Patient denies headache, fevers, malaise, unintentional weight loss, skin rash, eye pain, sinus congestion and sinus pain, sore throat, dysphagia,  hemoptysis , cough, dyspnea, wheezing, chest pain, palpitations, orthopnea, edema, abdominal pain, nausea, melena, diarrhea, constipation, flank pain, dysuria, hematuria, urinary  Frequency, nocturia, numbness, tingling, seizures,  Focal weakness, Loss of consciousness,  Tremor, insomnia, depression, anxiety, and suicidal ideation.     Objective:  BP 130/74 mmHg  Pulse 62  Temp(Src) 98.3 F (36.8 C) (Oral)  Resp 12  Ht 5' 3.75" (1.619 m)  Wt 154 lb 4 oz (69.967 kg)  BMI 26.69 kg/m2  SpO2 98%  Physical Exam:  General appearance: alert, cooperative and appears stated age Ears: normal TM's and external ear canals both ears Throat: lips, mucosa, and tongue normal; teeth and gums normal Neck: no adenopathy, no carotid bruit, supple,  symmetrical, trachea midline and thyroid not enlarged, symmetric, no tenderness/mass/nodules Back: symmetric, no curvature. ROM normal. No CVA tenderness. Lungs: clear to auscultation bilaterally Heart: regular rate and rhythm, S1, S2 normal, no murmur, click, rub or gallop Abdomen: soft, non-tender; bowel sounds normal; no masses,  no organomegaly Pulses: 2+ and  symmetric Skin: Skin color, texture, turgor normal. No rashes or lesions Lymph nodes: Cervical, supraclavicular, and axillary nodes normal.   Assessment & Plan:   Problem List Items Addressed This Visit    Hyperlipidemia    MANAGED WITH CRESTOR. No muscle pain. Liver enzymes are normal. No changes today   Lab Results  Component Value Date   CHOL 124 08/05/2015   HDL 66 08/05/2015   LDLCALC 40 08/05/2015   LDLDIRECT 48.0 12/26/2015   TRIG 91 08/05/2015   CHOLHDL 1.9 01/25/2014   Lab Results  Component Value Date   ALT 10 12/26/2015   AST 14 12/26/2015   ALKPHOS 96 12/26/2015   BILITOT 0.3 12/26/2015         Hypertension    Well controlled on current regimen of metoprolol.       Subclinical hypothyroidism    She is asymptomatic .  No medication has been advised .  Lab Results  Component Value Date   TSH 5.020* 08/05/2015         Diabetes mellitus without complication ([place])    Managed with metformin and Januvia. HAS NOT MICROALBUMINURIA .  SHE IS TAKING  A STATIN   Lab Results  Component Value Date   HGBA1C 7.0 11/29/2015   No results found for: MICROALBUR, [id number]       Cough    Normal exam.  Likely to to irritation form environmental irritants.       Atherosclerosis of coronary artery - Primary   Relevant Orders   Comprehensive metabolic panel (Completed)    Other Visit Diagnoses    Long-term use of high-risk medication        Relevant Orders    LDL cholesterol, direct (Completed)      A total of 45 minutes of face to face time was spent with patient more than half of which was spent in counselling about the low carbohydrate diet, above mentioned conditions  and coordination of care.    I am having Ms. Ailey maintain her aspirin, rosuvastatin, metFORMIN, metoprolol succinate, JANUVIA, GLIPIZIDE XL, and omeprazole.  Meds ordered this encounter  Medications  . omeprazole (PRILOSEC OTC) 20 MG tablet    Sig: Take 20 mg by mouth daily. Patient  takes every other day.    There are no discontinued medications.  Follow-up: Return in about 3 months (around 03/27/2016) for follow up diabetes.   Crecencio Mc, MD

## 2015-12-26 NOTE — Patient Instructions (Signed)
It was very nice to meet you!  I'll see you in 3 months for diabetes follow up.  Here are a few suggestions for lowering the carbohyrates in your diet    To make a low carb chip :  Take the Joseph's Lavash or Pita bread,  Or the [place] Low carb whole wheat tortilla   Place on metal cookie sheet  Brush with olive oil  Sprinkle garlic powder (NOT garlic salt), grated parmesan cheese, mediterranean seasoning , or all of them?  Bake at 275 for 30 minutes   We have substitutions for your potatoes!!  Try the mashed cauliflower and riced cauliflower dishes instead of rice and mashed potatoes  Mashed turnips are also very low carb!   For desserts :  Try the Dannon Lt n Fit greek yogurt dessert flavors and top with reddi Whip .  8 carbs,  80 calories  Try Oikos Triple Zero [place] Yogurt in the salted caramel, and the coffee flavors  With Whipped Cream for dessert  breyer's low carb ice cream, available in bars (on a stick, better ) or scoopable ice cream  HERE ARE THE LOW CARB  BREAD CHOICES

## 2015-12-26 NOTE — Progress Notes (Signed)
Pre-visit discussion using our clinic review tool. No additional management support is needed unless otherwise documented below in the visit note.  

## 2015-12-28 DIAGNOSIS — R05 Cough: Secondary | ICD-10-CM | POA: Insufficient documentation

## 2015-12-28 DIAGNOSIS — R059 Cough, unspecified: Secondary | ICD-10-CM | POA: Insufficient documentation

## 2015-12-28 NOTE — Assessment & Plan Note (Signed)
MANAGED WITH CRESTOR. No muscle pain. Liver enzymes are normal. No changes today   Lab Results  Component Value Date   CHOL 124 08/05/2015   HDL 66 08/05/2015   LDLCALC 40 08/05/2015   LDLDIRECT 48.0 12/26/2015   TRIG 91 08/05/2015   CHOLHDL 1.9 01/25/2014   Lab Results  Component Value Date   ALT 10 12/26/2015   AST 14 12/26/2015   ALKPHOS 96 12/26/2015   BILITOT 0.3 12/26/2015

## 2015-12-28 NOTE — Assessment & Plan Note (Addendum)
Managed with metformin and Januvia. HAS NOT MICROALBUMINURIA .  SHE IS TAKING  A STATIN   Lab Results  Component Value Date   HGBA1C 7.0 11/29/2015   No results found for: Derl Barrow

## 2015-12-28 NOTE — Assessment & Plan Note (Signed)
Normal exam.  Likely to to irritation form environmental irritants.

## 2015-12-28 NOTE — Assessment & Plan Note (Signed)
Well controlled on current regimen of metoprolol.

## 2015-12-28 NOTE — Assessment & Plan Note (Signed)
She is asymptomatic .  No medication has been advised .  Lab Results  Component Value Date   TSH 5.020* 08/05/2015

## 2015-12-29 ENCOUNTER — Encounter: Payer: Self-pay | Admitting: Internal Medicine

## 2016-01-20 ENCOUNTER — Telehealth: Payer: Self-pay | Admitting: Internal Medicine

## 2016-01-20 MED ORDER — TRIAMCINOLONE ACETONIDE 0.1 % EX CREA: 1.0000 "application " | TOPICAL_CREAM | Freq: Two times a day (BID) | CUTANEOUS | Status: DC

## 2016-01-20 MED ORDER — PREDNISONE 10 MG PO TABS: ORAL_TABLET | ORAL | Status: DC

## 2016-01-20 NOTE — Telephone Encounter (Signed)
Pt called about coming into contact with poison oak and know that's what it is because she had it before and it's spreading fast. Yesterday she had four or five bumps know it's from inner part of her arm to mid fore arm up towards elbow. Pt states her cardiologist have her a steroid cream.   Pharmacy is [facility], [place]  Call pt @ [phone]. Thank you!

## 2016-01-20 NOTE — Telephone Encounter (Signed)
prednisone taper 6 day  And triamcinolone ointment prescribed and setn to pharmacy

## 2016-01-20 NOTE — Telephone Encounter (Signed)
Per the Patient Dr. Rockey Situ prescribed a cream for this over a year ago, she can't remember the name of it.  I looked in OV notes and the only time he prescribed medications for Posion Ivy was on 01/25/2014 and it was a Prednisone taper of '60mg'$  daily for 3 days and then '40mg'$  daily for 3 days, then '20mg'$  daily for 3 days and finally '10mg'$  daily for 3 days.  No mention in his note about any cream.  Not sure what she is referring to, or what he gave as it is not in the note as she thought. Please advise. Thanks

## 2016-01-20 NOTE — Telephone Encounter (Signed)
Notified patient of rx's. Thanks

## 2016-01-20 NOTE — Telephone Encounter (Signed)
Last Visit was 6/8, Please advise for cream for [facility], Or does she need a OV? thanks

## 2016-01-20 NOTE — Telephone Encounter (Signed)
Again,  Unclear .  The first note sstates that her cardiologist gave her a cream, so it would have been helpful to find out what the cream was

## 2016-01-29 NOTE — Telephone Encounter (Signed)
Mailed unread message to patient.  

## 2016-02-05 ENCOUNTER — Ambulatory Visit: Payer: BC Managed Care – PPO | Admitting: Cardiovascular Disease

## 2016-02-07 ENCOUNTER — Ambulatory Visit: Payer: BC Managed Care – PPO | Admitting: Cardiovascular Disease

## 2016-02-19 ENCOUNTER — Other Ambulatory Visit: Payer: Self-pay | Admitting: Family Medicine

## 2016-02-19 ENCOUNTER — Other Ambulatory Visit: Payer: Self-pay | Admitting: Internal Medicine

## 2016-02-20 ENCOUNTER — Other Ambulatory Visit: Payer: Self-pay

## 2016-02-20 MED ORDER — ROSUVASTATIN CALCIUM 20 MG PO TABS: 20.0000 mg | ORAL_TABLET | Freq: Every day | ORAL | 3 refills | Status: DC

## 2016-02-24 ENCOUNTER — Other Ambulatory Visit: Payer: Self-pay | Admitting: Family Medicine

## 2016-02-25 ENCOUNTER — Other Ambulatory Visit: Payer: Self-pay

## 2016-02-25 NOTE — Telephone Encounter (Signed)
Please advise refill as this was refilled prior by Dr. Rockey Situ, thanks

## 2016-02-26 MED ORDER — METFORMIN HCL 1000 MG PO TABS: 1000.0000 mg | ORAL_TABLET | Freq: Every day | ORAL | 2 refills | Status: DC

## 2016-03-27 ENCOUNTER — Ambulatory Visit (INDEPENDENT_AMBULATORY_CARE_PROVIDER_SITE_OTHER): Payer: BC Managed Care – PPO | Admitting: Internal Medicine

## 2016-03-27 ENCOUNTER — Encounter: Payer: Self-pay | Admitting: Internal Medicine

## 2016-03-27 VITALS — BP 124/80 | HR 66 | Temp 97.6°F | Resp 14 | Ht 64.0 in | Wt 156.1 lb

## 2016-03-27 DIAGNOSIS — E785 Hyperlipidemia, unspecified: Secondary | ICD-10-CM | POA: Diagnosis not present

## 2016-03-27 DIAGNOSIS — Z79899 Other long term (current) drug therapy: Secondary | ICD-10-CM | POA: Diagnosis not present

## 2016-03-27 DIAGNOSIS — Z23 Encounter for immunization: Secondary | ICD-10-CM | POA: Diagnosis not present

## 2016-03-27 DIAGNOSIS — E119 Type 2 diabetes mellitus without complications: Secondary | ICD-10-CM | POA: Diagnosis not present

## 2016-03-27 DIAGNOSIS — R059 Cough, unspecified: Secondary | ICD-10-CM

## 2016-03-27 DIAGNOSIS — I951 Orthostatic hypotension: Secondary | ICD-10-CM

## 2016-03-27 DIAGNOSIS — R05 Cough: Secondary | ICD-10-CM

## 2016-03-27 MED ORDER — FAMOTIDINE 20 MG PO TABS: 20.0000 mg | ORAL_TABLET | Freq: Two times a day (BID) | ORAL | 5 refills | Status: DC

## 2016-03-27 NOTE — Patient Instructions (Addendum)
I'm glad your reflux is better controlled on daily Prilosec  We discussed my concern about continuing your PPI (Prilosec) in light of the recently published studies suggesting an association with increased risk of dementia, B12 deficiency and kidney failure.  I advised  you to try using  famotidine 20 mg  twice daily, and I sent  an rx to your pharmacy.    if your reflux symptoms are controlled,  You can Continue the daily h2 blocker. If not,  You can always resume prilosec    Food Choices for Gastroesophageal Reflux Disease, Adult When you have gastroesophageal reflux disease (GERD), the foods you eat and your eating habits are very important. Choosing the right foods can help ease the discomfort of GERD. WHAT GENERAL GUIDELINES DO I NEED TO FOLLOW?  Choose fruits, vegetables, whole grains, low-fat dairy products, and low-fat meat, fish, and poultry.  Limit fats such as oils, salad dressings, butter, nuts, and avocado.  Keep a food diary to identify foods that cause symptoms.  Avoid foods that cause reflux. These may be different for different people.  Eat frequent small meals instead of three large meals each day.  Eat your meals slowly, in a relaxed setting.  Limit fried foods.  Cook foods using methods other than frying.  Avoid drinking alcohol.  Avoid drinking large amounts of liquids with your meals.  Avoid bending over or lying down until 2-3 hours after eating. WHAT FOODS ARE NOT RECOMMENDED? The following are some foods and drinks that may worsen your symptoms: Vegetables Tomatoes. Tomato juice. Tomato and spaghetti sauce. Chili peppers. Onion and garlic. Horseradish. Fruits Oranges, grapefruit, and lemon (fruit and juice). Meats High-fat meats, fish, and poultry. This includes hot dogs, ribs, ham, sausage, salami, and bacon. Dairy Whole milk and chocolate milk. Sour cream. Cream. Butter. Ice cream. Cream cheese.  Beverages Coffee and tea, with or without caffeine.  Carbonated beverages or energy drinks. Condiments Hot sauce. Barbecue sauce.  Sweets/Desserts Chocolate and cocoa. Donuts. Peppermint and spearmint. Fats and Oils High-fat foods, including [place] fries and potato chips. Other Vinegar. Strong spices, such as black pepper, white pepper, red pepper, cayenne, curry powder, cloves, ginger, and chili powder. The items listed above may not be a complete list of foods and beverages to avoid. Contact your dietitian for more information.   This information is not intended to replace advice given to you by your health care provider. Make sure you discuss any questions you have with your health care provider.   Document Released: 07/06/2005 Document Revised: 07/27/2014 Document Reviewed: 05/10/2013 Elsevier Interactive Patient Education [employer].

## 2016-03-27 NOTE — Progress Notes (Signed)
Subjective:  Patient ID: Dana Ramirez, female    DOB: 04-Apr-1951  Age: 65 y.o. MRN: [id number]  CC: The primary encounter diagnosis was Long-term use of high-risk medication. Diagnoses of Hyperlipidemia, Diabetes mellitus without complication ([place]), Encounter for immunization, Orthostatic hypotension, and Cough were also pertinent to this visit.  HPI Dana Ramirez presents for follow up on Type 2 DM and  CAD,  last seen in June.  She feels generally well, is exercising several times per week and checking blood sugars once daily at variable times.  BS have been under 110 fasting and < 150 post prandially.  Denies any recent hypoglyemic events.  Taking her medications as directed. Following a carbohydrate modified diet 6 days per week. Denies numbness, burning and tingling of extremities. Appetite is good.    2) Cough  Resolved with daily treatment of reflux of prilosec. Discussed current controversy regarding prolonged use of PPI in patients without documented Barretts esophagus.  Patient has no prior EGD but has been on PPI therapy for several months with good results.   Lab Results  Component Value Date   HGBA1C 7.0 11/29/2015     Outpatient Medications Prior to Visit  Medication Sig Dispense Refill  . aspirin (ASPIR-LOW) 81 MG EC tablet Take 162 mg by mouth daily.      Marland Kitchen GLIPIZIDE XL 2.5 MG 24 hr tablet TAKE 1 TABLET (2.5 MG TOTAL) BY MOUTH DAILY WITH BREAKFAST. 30 tablet 5  . JANUVIA 100 MG tablet TAKE 1 TABLET (100 MG TOTAL) BY MOUTH DAILY. 30 tablet 5  . metFORMIN (GLUCOPHAGE) 1000 MG tablet Take 1 tablet (1,000 mg total) by mouth daily with breakfast. 90 tablet 2  . metoprolol succinate (TOPROL-XL) 25 MG 24 hr tablet TAKE 1 TABLET DAILY 90 tablet 3  . omeprazole (PRILOSEC OTC) 20 MG tablet Take 20 mg by mouth daily. Patient takes every other day.    . rosuvastatin (CRESTOR) 20 MG tablet Take 1 tablet (20 mg total) by mouth daily. 90 tablet 3  . predniSONE (DELTASONE)  10 MG tablet 6 tablets on Day 1 , then reduce by 1 tablet daily until gone (Patient not taking: Reported on 03/27/2016) 21 tablet 0  . triamcinolone cream (KENALOG) 0.1 % Apply 1 application topically 2 (two) times daily. (Patient not taking: Reported on 03/27/2016) 30 g 0   No facility-administered medications prior to visit.     Review of Systems;  Patient denies headache, fevers, malaise, unintentional weight loss, skin rash, eye pain, sinus congestion and sinus pain, sore throat, dysphagia,  hemoptysis , cough, dyspnea, wheezing, chest pain, palpitations, orthopnea, edema, abdominal pain, nausea, melena, diarrhea, constipation, flank pain, dysuria, hematuria, urinary  Frequency, nocturia, numbness, tingling, seizures,  Focal weakness, Loss of consciousness,  Tremor, insomnia, depression, anxiety, and suicidal ideation.      Objective:  BP 124/80 (BP Location: Left Arm, Patient Position: Sitting, Cuff Size: Small)   Pulse 66   Temp 97.6 F (36.4 C) (Oral)   Resp 14   Ht '5\' 4"'$  (1.626 m)   Wt 156 lb 2 oz (70.8 kg)   SpO2 96%   BMI 26.80 kg/m   BP Readings from Last 3 Encounters:  03/27/16 124/80  12/26/15 130/74  11/29/15 122/72    Wt Readings from Last 3 Encounters:  03/27/16 156 lb 2 oz (70.8 kg)  12/26/15 154 lb 4 oz (70 kg)  11/29/15 154 lb (69.9 kg)    General appearance: alert, cooperative and appears stated age Ears:  normal TM's and external ear canals both ears Throat: lips, mucosa, and tongue normal; teeth and gums normal Neck: no adenopathy, no carotid bruit, supple, symmetrical, trachea midline and thyroid not enlarged, symmetric, no tenderness/mass/nodules Back: symmetric, no curvature. ROM normal. No CVA tenderness. Lungs: clear to auscultation bilaterally Heart: regular rate and rhythm, S1, S2 normal, no murmur, click, rub or gallop Abdomen: soft, non-tender; bowel sounds normal; no masses,  no organomegaly Pulses: 2+ and symmetric Skin: Skin color, texture,  turgor normal. No rashes or lesions Lymph nodes: Cervical, supraclavicular, and axillary nodes normal.  Lab Results  Component Value Date   HGBA1C 7.0 11/29/2015   HGBA1C 8.5 (H) 08/05/2015    Lab Results  Component Value Date   CREATININE 0.60 12/26/2015   CREATININE 0.69 08/05/2015   CREATININE 0.84 12/29/2014    Lab Results  Component Value Date   WBC 3.7 08/05/2015   HGB 13.3 12/29/2014   HCT 40.0 08/05/2015   PLT 191 08/05/2015   GLUCOSE 86 12/26/2015   CHOL 124 08/05/2015   TRIG 91 08/05/2015   HDL 66 08/05/2015   LDLDIRECT 48.0 12/26/2015   LDLCALC 40 08/05/2015   ALT 10 12/26/2015   AST 14 12/26/2015   NA 140 12/26/2015   K 5.0 12/26/2015   CL 103 12/26/2015   CREATININE 0.60 12/26/2015   BUN 20 12/26/2015   CO2 30 12/26/2015   TSH 5.020 (H) 08/05/2015   HGBA1C 7.0 11/29/2015    Mm Digital Screening Bilateral  Result Date: 08/15/2015 CLINICAL DATA:  Screening. EXAM: DIGITAL SCREENING BILATERAL MAMMOGRAM WITH CAD COMPARISON:  Previous exam(s). ACR Breast Density Category c: The breast tissue is heterogeneously dense, which may obscure small masses. FINDINGS: There are no findings suspicious for malignancy. Images were processed with CAD. IMPRESSION: No mammographic evidence of malignancy. A result letter of this screening mammogram will be mailed directly to the patient. RECOMMENDATION: Screening mammogram in one year. (Code:SM-B-01Y) BI-RADS CATEGORY  1: Negative. Electronically Signed   By: Pamelia Hoit M.D.   On: 08/15/2015 20:27    Assessment & Plan:   Problem List Items Addressed This Visit    Hyperlipidemia    MANAGED WITH CRESTOR. No muscle pain. Liver enzymes are normal. No changes today   Lab Results  Component Value Date   CHOL 124 08/05/2015   HDL 66 08/05/2015   LDLCALC 40 08/05/2015   LDLDIRECT 48.0 12/26/2015   TRIG 91 08/05/2015   CHOLHDL 1.9 01/25/2014   Lab Results  Component Value Date   ALT 10 12/26/2015   AST 14 12/26/2015    ALKPHOS 96 12/26/2015   BILITOT 0.3 12/26/2015         Relevant Orders   LDL cholesterol, direct   Orthostatic hypotension    Well controlled on current regimen. Renal function stable, no changes today.  Lab Results  Component Value Date   CREATININE 0.60 12/26/2015   Lab Results  Component Value Date   NA 140 12/26/2015   K 5.0 12/26/2015   CL 103 12/26/2015   CO2 30 12/26/2015         Diabetes mellitus without complication ([place])    Managed with metformin and Januvia.  No prior testing for microalbuminuria. A .  SHE IS TAKING  A STATIN   Lab Results  Component Value Date   HGBA1C 7.0 11/29/2015   No results found for: MICROALBUR, [id number]       Relevant Orders   Urine Microalbumin w/creat. ratio   Hemoglobin A1c  Cough    Improved dramatically with daily use of prilosec. Discussed current controversy regarding prolonged use of PPI in patients without documented Barretts esophagus.  Patient has no prior EGD but has been on PPI therapy for > 5 years (per patient).  Suggested trial of pepcid 20 mg daily.  If GERD symptoms return,  advised her to accept referral for EGD.        Other Visit Diagnoses    Long-term use of high-risk medication    -  Primary   Relevant Orders   Vitamin B12   Comprehensive metabolic panel   Encounter for immunization       Relevant Medications   famotidine (PEPCID) 20 MG tablet   Other Relevant Orders   Urine Microalbumin w/creat. ratio   Flu Vaccine QUAD 36+ mos IM (Completed)      I have discontinued Ms. Kovacich predniSONE and triamcinolone cream. I am also having her start on famotidine. Additionally, I am having her maintain her aspirin, metoprolol succinate, JANUVIA, GLIPIZIDE XL, omeprazole, rosuvastatin, and metFORMIN.  Meds ordered this encounter  Medications  . famotidine (PEPCID) 20 MG tablet    Sig: Take 1 tablet (20 mg total) by mouth 2 (two) times daily.    Dispense:  60 tablet    Refill:  5    Medications  Discontinued During This Encounter  Medication Reason  . predniSONE (DELTASONE) 10 MG tablet   . triamcinolone cream (KENALOG) 0.1 %     Follow-up: No Follow-up on file.   Crecencio Mc, MD

## 2016-03-27 NOTE — Progress Notes (Signed)
Pre visit review using our clinic review tool, if applicable. No additional management support is needed unless otherwise documented below in the visit note. 

## 2016-03-29 NOTE — Assessment & Plan Note (Addendum)
Managed with metformin and Januvia.  No prior testing for microalbuminuria. A .  SHE IS TAKING  A STATIN   Lab Results  Component Value Date   HGBA1C 7.0 11/29/2015   No results found for: Dana Ramirez

## 2016-03-29 NOTE — Assessment & Plan Note (Signed)
Well controlled on current regimen. Renal function stable, no changes today.  Lab Results  Component Value Date   CREATININE 0.60 12/26/2015   Lab Results  Component Value Date   NA 140 12/26/2015   K 5.0 12/26/2015   CL 103 12/26/2015   CO2 30 12/26/2015

## 2016-03-29 NOTE — Assessment & Plan Note (Signed)
MANAGED WITH CRESTOR. No muscle pain. Liver enzymes are normal. No changes today   Lab Results  Component Value Date   CHOL 124 08/05/2015   HDL 66 08/05/2015   LDLCALC 40 08/05/2015   LDLDIRECT 48.0 12/26/2015   TRIG 91 08/05/2015   CHOLHDL 1.9 01/25/2014   Lab Results  Component Value Date   ALT 10 12/26/2015   AST 14 12/26/2015   ALKPHOS 96 12/26/2015   BILITOT 0.3 12/26/2015

## 2016-03-29 NOTE — Assessment & Plan Note (Signed)
Improved dramatically with daily use of prilosec. Discussed current controversy regarding prolonged use of PPI in patients without documented Barretts esophagus.  Patient has no prior EGD but has been on PPI therapy for > 5 years (per patient).  Suggested trial of pepcid 20 mg daily.  If GERD symptoms return,  advised her to accept referral for EGD.

## 2016-03-31 ENCOUNTER — Other Ambulatory Visit (INDEPENDENT_AMBULATORY_CARE_PROVIDER_SITE_OTHER): Payer: BC Managed Care – PPO

## 2016-03-31 DIAGNOSIS — Z23 Encounter for immunization: Secondary | ICD-10-CM | POA: Diagnosis not present

## 2016-03-31 DIAGNOSIS — Z79899 Other long term (current) drug therapy: Secondary | ICD-10-CM

## 2016-03-31 DIAGNOSIS — E119 Type 2 diabetes mellitus without complications: Secondary | ICD-10-CM | POA: Diagnosis not present

## 2016-03-31 DIAGNOSIS — E785 Hyperlipidemia, unspecified: Secondary | ICD-10-CM | POA: Diagnosis not present

## 2016-03-31 LAB — COMPREHENSIVE METABOLIC PANEL
ALT: 13 U/L (ref 0–35)
AST: 16 U/L (ref 0–37)
Albumin: 4.3 g/dL (ref 3.5–5.2)
Alkaline Phosphatase: 82 U/L (ref 39–117)
BUN: 19 mg/dL (ref 6–23)
CHLORIDE: 101 meq/L (ref 96–112)
CO2: 31 meq/L (ref 19–32)
CREATININE: 0.66 mg/dL (ref 0.40–1.20)
Calcium: 9.5 mg/dL (ref 8.4–10.5)
GFR: 95.9 mL/min (ref >60.00)
Glucose, Bld: 103 mg/dL — ABNORMAL HIGH (ref 70–99)
POTASSIUM: 3.9 meq/L (ref 3.5–5.1)
SODIUM: 138 meq/L (ref 135–145)
Total Bilirubin: 0.3 mg/dL (ref 0.2–1.2)
Total Protein: 7.5 g/dL (ref 6.0–8.3)

## 2016-03-31 LAB — LDL CHOLESTEROL, DIRECT: Direct LDL: 55 mg/dL

## 2016-03-31 LAB — VITAMIN B12: Vitamin B-12: 197 pg/mL — ABNORMAL LOW (ref 211–911)

## 2016-03-31 LAB — MICROALBUMIN / CREATININE URINE RATIO
Creatinine,U: 93.5 mg/dL
MICROALB/CREAT RATIO: 1.1 mg/g (ref 0.0–30.0)
Microalb, Ur: 1 mg/dL (ref 0.0–1.9)

## 2016-03-31 LAB — HEMOGLOBIN A1C: Hgb A1c MFr Bld: 7.3 % — ABNORMAL HIGH (ref 4.6–6.5)

## 2016-04-02 ENCOUNTER — Encounter: Payer: Self-pay | Admitting: Internal Medicine

## 2016-04-02 ENCOUNTER — Other Ambulatory Visit: Payer: BC Managed Care – PPO

## 2016-04-02 DIAGNOSIS — E538 Deficiency of other specified B group vitamins: Secondary | ICD-10-CM | POA: Insufficient documentation

## 2016-04-06 ENCOUNTER — Telehealth: Payer: Self-pay | Admitting: Internal Medicine

## 2016-04-06 ENCOUNTER — Encounter: Payer: Self-pay | Admitting: Cardiovascular Disease

## 2016-04-06 ENCOUNTER — Ambulatory Visit (INDEPENDENT_AMBULATORY_CARE_PROVIDER_SITE_OTHER): Payer: BC Managed Care – PPO | Admitting: Cardiovascular Disease

## 2016-04-06 VITALS — BP 110/66 | HR 77 | Ht 63.0 in | Wt 156.0 lb

## 2016-04-06 DIAGNOSIS — E785 Hyperlipidemia, unspecified: Secondary | ICD-10-CM | POA: Diagnosis not present

## 2016-04-06 DIAGNOSIS — I6523 Occlusion and stenosis of bilateral carotid arteries: Secondary | ICD-10-CM | POA: Diagnosis not present

## 2016-04-06 DIAGNOSIS — I2510 Atherosclerotic heart disease of native coronary artery without angina pectoris: Secondary | ICD-10-CM | POA: Diagnosis not present

## 2016-04-06 DIAGNOSIS — I10 Essential (primary) hypertension: Secondary | ICD-10-CM

## 2016-04-06 DIAGNOSIS — E119 Type 2 diabetes mellitus without complications: Secondary | ICD-10-CM

## 2016-04-06 NOTE — Telephone Encounter (Signed)
Spoke with the patient, reviewed the mychart message with her as she was waiting on the doctor at [facility] to see her.  She scheduled first of 3 weekly shots for her B12.

## 2016-04-06 NOTE — Progress Notes (Signed)
Cardiology Office Note  Date:  04/06/2016   ID:  Dana Ramirez, DOB August 05, 1950, MRN [id number]  PCP:  Crecencio Mc, MD   Chief Complaint  Patient presents with  . other    6 month follow up. Meds reviewed by the pt. verbally.     HPI:  Ms. Dana Ramirez is a very pleasant 65 year old woman with a history of coronary artery disease, bypass surgery in September 2006 with a LIMA to the LAD, vein graft to the diagonal, negative stress test in 2010, Less than 39% bilateral carotid disease,  who presents for routine followup of her coronary artery disease  In follow-up today she reports doing well, no symptoms concerning for angina No significant chest pain or shortness of breath on exertion No regular exercise program. Lab work reviewed with her Hematoma name C7 0.3 LDL 55, Try to watch her diet. She increased Crestor up to a full pill daily  EKG on today's visit shows normal sinus rhythm with rate 77 bpm, no significant ST or T-wave changes  Other past medical history Cholesterol well-controlled for the past 6-7 years Long  prior history of vertigo  Losartan decreased in the past. This helped her lightheaded spells   Cardiac catheterization done September 2006 showed a diffusely diseased LAD from the left main to the midportion with lesion up to 90-95%, 95% ostial diagonal disease.     PMH:   has a past medical history of CAD (coronary artery disease); Carotid arterial disease ([place]); Diabetes mellitus without complication ([place]); History of vertigo; Hyperlipidemia; Hypertension; and Kidney stone.  PSH:    Past Surgical History:  Procedure Laterality Date  . BREAST CYST ASPIRATION Left years ago  . CESAREAN SECTION     x 2  . CHOLECYSTECTOMY    . CORONARY ARTERY BYPASS GRAFT  03/2005   LIMA to the LAD, vein graft to diagonal with a negative stress test in 06/2006  . TONSILLECTOMY      Current Outpatient Prescriptions  Medication Sig Dispense Refill  . aspirin (ASPIR-LOW)  81 MG EC tablet Take 162 mg by mouth daily.      . famotidine (PEPCID) 20 MG tablet Take 1 tablet (20 mg total) by mouth 2 (two) times daily. 60 tablet 5  . GLIPIZIDE XL 2.5 MG 24 hr tablet TAKE 1 TABLET (2.5 MG TOTAL) BY MOUTH DAILY WITH BREAKFAST. 30 tablet 5  . JANUVIA 100 MG tablet TAKE 1 TABLET (100 MG TOTAL) BY MOUTH DAILY. 30 tablet 5  . metFORMIN (GLUCOPHAGE) 1000 MG tablet Take 1 tablet (1,000 mg total) by mouth daily with breakfast. 90 tablet 2  . metoprolol succinate (TOPROL-XL) 25 MG 24 hr tablet TAKE 1 TABLET DAILY 90 tablet 3  . omeprazole (PRILOSEC OTC) 20 MG tablet Take 20 mg by mouth daily. Patient takes every other day.    . rosuvastatin (CRESTOR) 20 MG tablet Take 1 tablet (20 mg total) by mouth daily. 90 tablet 3   No current facility-administered medications for this visit.      Allergies:   Codeine; Lisinopril; and Penicillins   Social History:  The patient  reports that she has never smoked. She has never used smokeless tobacco. She reports that she does not drink alcohol or use drugs.   Family History:   family history includes Breast cancer (age of onset: 46) in her paternal grandmother; Coronary artery disease in her other; Diabetes in her brother; Heart disease in her maternal grandfather, maternal grandmother, mother, paternal grandfather, paternal grandmother,  and sister; Heart disease (age of onset: 63) in her father; Stroke (age of onset: 22) in her sister.    Review of Systems: Review of Systems  Constitutional: Negative.   Respiratory: Negative.   Cardiovascular: Negative.   Gastrointestinal: Negative.   Musculoskeletal: Negative.   Neurological: Negative.   Psychiatric/Behavioral: Negative.   All other systems reviewed and are negative.    PHYSICAL EXAM: VS:  BP 110/66 (BP Location: Left Arm, Patient Position: Sitting, Cuff Size: Normal)   Pulse 77   Ht '5\' 3"'$  (1.6 m)   Wt 156 lb (70.8 kg)   BMI 27.63 kg/m  , BMI Body mass index is 27.63  kg/m. GEN: Well nourished, well developed, in no acute distress  HEENT: normal  Neck: no JVD, carotid bruits, or masses Cardiac: RRR; no murmurs, rubs, or gallops,no edema  Respiratory:  clear to auscultation bilaterally, normal work of breathing GI: soft, nontender, nondistended, + BS MS: no deformity or atrophy  Skin: warm and dry, no rash Neuro:  Strength and sensation are intact Psych: euthymic mood, full affect    Recent Labs: 08/05/2015: Platelets 191; TSH 5.020 03/31/2016: ALT 13; BUN 19; Creatinine, Ser 0.66; Potassium 3.9; Sodium 138    Lipid Panel Lab Results  Component Value Date   CHOL 124 08/05/2015   HDL 66 08/05/2015   LDLCALC 40 08/05/2015   TRIG 91 08/05/2015      Wt Readings from Last 3 Encounters:  04/06/16 156 lb (70.8 kg)  03/27/16 156 lb 2 oz (70.8 kg)  12/26/15 154 lb 4 oz (70 kg)       ASSESSMENT AND PLAN:  Atherosclerosis of native coronary artery of native heart without angina pectoris - Plan: EKG 12-Lead Currently with no symptoms of angina. No further workup at this time. Continue current medication regimen.  Essential hypertension - Plan: EKG 12-Lead Blood pressure is well controlled on today's visit. No changes made to the medications.  Carotid stenosis, bilateral Mild bilateral carotid disease Can likely rescan every several years  Hyperlipidemia Cholesterol is at goal on the current lipid regimen. No changes to the medications were made.  Diabetes mellitus without complication ([place]) Dietary changes discussed with her. Recommended low carbohydrate diet Dietary guide provided Recommended she start a exercise program Difficult recently given stressors in the family   Total encounter time more than 25 minutes  Greater than 50% was spent in counseling and coordination of care with the patient   Disposition:   F/U  6 months   Orders Placed This Encounter  Procedures  . EKG 12-Lead     Signed, Esmond Plants, M.D.,  Ph.D. 04/06/2016  [place], [place]

## 2016-04-06 NOTE — Patient Instructions (Signed)

## 2016-04-06 NOTE — Telephone Encounter (Signed)
Pt called to get lab results from 03/31/16. Thank you!  Call pt @ [phone].

## 2016-04-09 ENCOUNTER — Ambulatory Visit (INDEPENDENT_AMBULATORY_CARE_PROVIDER_SITE_OTHER): Payer: BC Managed Care – PPO

## 2016-04-09 DIAGNOSIS — E538 Deficiency of other specified B group vitamins: Secondary | ICD-10-CM | POA: Diagnosis not present

## 2016-04-09 MED ORDER — CYANOCOBALAMIN 1000 MCG/ML IJ SOLN
1000.0000 ug | Freq: Once | INTRAMUSCULAR | Status: AC
  Administered 2016-04-09: 1000 ug via INTRAMUSCULAR

## 2016-04-09 MED ORDER — CYANOCOBALAMIN 1000 MCG/ML IJ SOLN: 1000.0000 ug | INTRAMUSCULAR | 0 refills | Status: DC

## 2016-04-09 NOTE — Progress Notes (Signed)
Patient came in for a b12 injection.  Received in left deltoid. Patient tolerated well.    Patient is requesting to have daughter give next 2 weekly injections and then 3 monthly ones at home.   Daughter is a Therapist, sports.

## 2016-04-13 LAB — HM DIABETES EYE EXAM

## 2016-04-22 ENCOUNTER — Encounter: Payer: Self-pay | Admitting: Internal Medicine

## 2016-04-23 ENCOUNTER — Telehealth: Payer: Self-pay | Admitting: Internal Medicine

## 2016-04-23 NOTE — Telephone Encounter (Signed)
Pt scheduled  

## 2016-04-23 NOTE — Telephone Encounter (Signed)
Patient needs to be seen 3 months from last OV

## 2016-04-23 NOTE — Telephone Encounter (Signed)
Pt called and was inquiring as to when she needed to come back for a follow up. Pt was last seen on 03/27/16. Please advise, thank you!  Call pt @ [phone]

## 2016-05-23 ENCOUNTER — Other Ambulatory Visit: Payer: Self-pay | Admitting: Family Medicine

## 2016-05-23 DIAGNOSIS — E119 Type 2 diabetes mellitus without complications: Secondary | ICD-10-CM

## 2016-05-24 ENCOUNTER — Other Ambulatory Visit: Payer: Self-pay | Admitting: Family Medicine

## 2016-05-24 DIAGNOSIS — E119 Type 2 diabetes mellitus without complications: Secondary | ICD-10-CM

## 2016-05-28 ENCOUNTER — Other Ambulatory Visit: Payer: Self-pay

## 2016-05-28 DIAGNOSIS — E119 Type 2 diabetes mellitus without complications: Secondary | ICD-10-CM

## 2016-05-28 MED ORDER — SITAGLIPTIN PHOSPHATE 100 MG PO TABS: ORAL_TABLET | ORAL | 5 refills | Status: DC

## 2016-05-28 NOTE — Telephone Encounter (Signed)
Previously prescribed by by Dr Venia Minks  Last office 03/27/16 next office visit 06/26/16

## 2016-06-03 ENCOUNTER — Other Ambulatory Visit: Payer: Self-pay | Admitting: Family Medicine

## 2016-06-03 DIAGNOSIS — E119 Type 2 diabetes mellitus without complications: Secondary | ICD-10-CM

## 2016-06-08 ENCOUNTER — Other Ambulatory Visit: Payer: Self-pay | Admitting: Family Medicine

## 2016-06-08 DIAGNOSIS — E119 Type 2 diabetes mellitus without complications: Secondary | ICD-10-CM

## 2016-06-10 ENCOUNTER — Other Ambulatory Visit: Payer: Self-pay

## 2016-06-10 DIAGNOSIS — E119 Type 2 diabetes mellitus without complications: Secondary | ICD-10-CM

## 2016-06-10 MED ORDER — GLIPIZIDE ER 2.5 MG PO TB24: ORAL_TABLET | ORAL | 5 refills | Status: DC

## 2016-06-10 NOTE — Telephone Encounter (Signed)
Previously prescribed by Dr Venia Minks he denied script, since now under your care Last office visit 03/27/16 Next office visit 06/26/16

## 2016-06-26 ENCOUNTER — Ambulatory Visit (INDEPENDENT_AMBULATORY_CARE_PROVIDER_SITE_OTHER): Payer: BC Managed Care – PPO | Admitting: Internal Medicine

## 2016-06-26 ENCOUNTER — Ambulatory Visit (INDEPENDENT_AMBULATORY_CARE_PROVIDER_SITE_OTHER): Payer: BC Managed Care – PPO

## 2016-06-26 ENCOUNTER — Encounter: Payer: Self-pay | Admitting: Internal Medicine

## 2016-06-26 VITALS — BP 124/82 | HR 62 | Temp 97.7°F | Resp 12 | Ht 63.0 in | Wt 153.2 lb

## 2016-06-26 DIAGNOSIS — Z1231 Encounter for screening mammogram for malignant neoplasm of breast: Secondary | ICD-10-CM

## 2016-06-26 DIAGNOSIS — E7800 Pure hypercholesterolemia, unspecified: Secondary | ICD-10-CM | POA: Diagnosis not present

## 2016-06-26 DIAGNOSIS — R05 Cough: Secondary | ICD-10-CM | POA: Diagnosis not present

## 2016-06-26 DIAGNOSIS — J181 Lobar pneumonia, unspecified organism: Secondary | ICD-10-CM

## 2016-06-26 DIAGNOSIS — E119 Type 2 diabetes mellitus without complications: Secondary | ICD-10-CM

## 2016-06-26 DIAGNOSIS — Z1239 Encounter for other screening for malignant neoplasm of breast: Secondary | ICD-10-CM

## 2016-06-26 DIAGNOSIS — R059 Cough, unspecified: Secondary | ICD-10-CM

## 2016-06-26 DIAGNOSIS — J189 Pneumonia, unspecified organism: Secondary | ICD-10-CM

## 2016-06-26 MED ORDER — AZITHROMYCIN 500 MG PO TABS: 500.0000 mg | ORAL_TABLET | Freq: Every day | ORAL | 0 refills | Status: DC

## 2016-06-26 NOTE — Progress Notes (Signed)
Subjective:  Patient ID: Dana Ramirez, female    DOB: 14-Apr-1951  Age: 65 y.o. MRN: [id number]  CC: The primary encounter diagnosis was Cough. Diagnoses of Screening for breast cancer, Diabetes mellitus without complication ([place]), Pure hypercholesterolemia, and Community acquired pneumonia of right upper lobe of lung ([place]) were also pertinent to this visit.  HPI Dana Ramirez presents for evaluation of non productive cough accompanied by a feeling of heaviness in her chest on the right .  Symptoms started with sinus congestion that started over 3 weeks ago . for the last 3 weeks, noticed occasional streak of blood in her nasal discharge (none in her sputum) which she has attributed to changes in the humidity in her home due to installation of a new heating system priro to the onset of dry nose. Her Cough is nonproductive,  denies fevers  Dyspnea/ wheezing .  No recent travel or visits to or from sick contacts.    2) 3 month follow up on diabetes.  Patient  is following a low glycemic index diet and taking all prescribed medications regularly without side effects.  Fasting sugars have been under less than 140 most of the time and post prandials have been under 160 except on rare occasions. Patient is exercising about 3 times per week and intentionally trying to lose weight .  Patient has had an eye exam in the last 12 months and checks feet regularly for signs of infection.  Patient does not walk barefoot outside,  And denies an numbness tingling or burning in feet. Patient is up to date on all recommended vaccinations    Outpatient Medications Prior to Visit  Medication Sig Dispense Refill  . aspirin (ASPIR-LOW) 81 MG EC tablet Take 162 mg by mouth daily.      . cyanocobalamin (,VITAMIN B-12,) 1000 MCG/ML injection Inject 1 mL (1,000 mcg total) into the muscle every 30 (thirty) days. Give 2 weekly shots '1000mg'$  then switch to monthly '1000mg'$  for 3 months 10 mL 0  . famotidine (PEPCID) 20  MG tablet Take 1 tablet (20 mg total) by mouth 2 (two) times daily. 60 tablet 5  . glipiZIDE (GLIPIZIDE XL) 2.5 MG 24 hr tablet TAKE 1 TABLET (2.5 MG TOTAL) BY MOUTH DAILY WITH BREAKFAST. 30 tablet 5  . metFORMIN (GLUCOPHAGE) 1000 MG tablet Take 1 tablet (1,000 mg total) by mouth daily with breakfast. 90 tablet 2  . metoprolol succinate (TOPROL-XL) 25 MG 24 hr tablet TAKE 1 TABLET DAILY 90 tablet 3  . rosuvastatin (CRESTOR) 20 MG tablet Take 1 tablet (20 mg total) by mouth daily. 90 tablet 3  . sitaGLIPtin (JANUVIA) 100 MG tablet TAKE 1 TABLET (100 MG TOTAL) BY MOUTH DAILY. 30 tablet 5  . omeprazole (PRILOSEC OTC) 20 MG tablet Take 20 mg by mouth daily. Patient takes every other day.     No facility-administered medications prior to visit.     Review of Systems;  Patient denies headache, fevers, malaise, unintentional weight loss, skin rash, eye pain, sinus congestion and sinus pain, sore throat, dysphagia,  hemoptysis , cough, dyspnea, wheezing, chest pain, palpitations, orthopnea, edema, abdominal pain, nausea, melena, diarrhea, constipation, flank pain, dysuria, hematuria, urinary  Frequency, nocturia, numbness, tingling, seizures,  Focal weakness, Loss of consciousness,  Tremor, insomnia, depression, anxiety, and suicidal ideation.      Objective:  BP 124/82   Pulse 62   Temp 97.7 F (36.5 C) (Oral)   Resp 12   Ht '5\' 3"'$  (1.6 m)  Wt 153 lb 4 oz (69.5 kg)   SpO2 98%   BMI 27.15 kg/m   BP Readings from Last 3 Encounters:  06/26/16 124/82  04/06/16 110/66  03/27/16 124/80    Wt Readings from Last 3 Encounters:  06/26/16 153 lb 4 oz (69.5 kg)  04/06/16 156 lb (70.8 kg)  03/27/16 156 lb 2 oz (70.8 kg)    General appearance: alert, cooperative and appears stated age Ears: normal TM's and external ear canals both ears Throat: lips, mucosa, and tongue normal; teeth and gums normal Neck: no adenopathy, no carotid bruit, supple, symmetrical, trachea midline and thyroid not  enlarged, symmetric, no tenderness/mass/nodules Back: symmetric, no curvature. ROM normal. No CVA tenderness. Lungs: clear to auscultation bilaterally Heart: regular rate and rhythm, S1, S2 normal, no murmur, click, rub or gallop Abdomen: soft, non-tender; bowel sounds normal; no masses,  no organomegaly Pulses: 2+ and symmetric Skin: Skin color, texture, turgor normal. No rashes or lesions Lymph nodes: Cervical, supraclavicular, and axillary nodes normal.  Lab Results  Component Value Date   HGBA1C 7.3 (H) 03/31/2016   HGBA1C 7.0 11/29/2015   HGBA1C 8.5 (H) 08/05/2015    Lab Results  Component Value Date   CREATININE 0.66 03/31/2016   CREATININE 0.60 12/26/2015   CREATININE 0.69 08/05/2015    Lab Results  Component Value Date   WBC 3.7 08/05/2015   HGB 13.3 12/29/2014   HCT 40.0 08/05/2015   PLT 191 08/05/2015   GLUCOSE 103 (H) 03/31/2016   CHOL 124 08/05/2015   TRIG 91 08/05/2015   HDL 66 08/05/2015   LDLDIRECT 55.0 03/31/2016   LDLCALC 40 08/05/2015   ALT 13 03/31/2016   AST 16 03/31/2016   NA 138 03/31/2016   K 3.9 03/31/2016   CL 101 03/31/2016   CREATININE 0.66 03/31/2016   BUN 19 03/31/2016   CO2 31 03/31/2016   TSH 5.020 (H) 08/05/2015   HGBA1C 7.3 (H) 03/31/2016   MICROALBUR 1.0 03/31/2016     Assessment & Plan:   Problem List Items Addressed This Visit    CAP (community acquired pneumonia)    Confirmed by chest x ray done today.  Will treat with azithromycin 500 mg x 7 days.  Return to clinic of no improvement in 48 hours,  Repeat chest x ray will be needed in 4 to 6 weeks      Relevant Medications   azithromycin (ZITHROMAX) 500 MG tablet   Cough - Primary   Relevant Orders   DG Chest 2 View (Completed)   CBC with Differential/Platelet   Diabetes mellitus without complication ([place])    Managed with metformin and Januvia.  No microalbuminuria. A .  She is tolerating daily use of a baby aspirin and a statin . Return next week for a1c and fasting  labs .   Lab Results  Component Value Date   HGBA1C 7.3 (H) 03/31/2016   Lab Results  Component Value Date   MICROALBUR 1.0 03/31/2016         Relevant Orders   Comprehensive metabolic panel   Hemoglobin A1c   Lipid panel   Hyperlipidemia   Relevant Orders   LDL cholesterol, direct    Other Visit Diagnoses    Screening for breast cancer       Relevant Orders   MM Digital Screening      I have discontinued Ms. Maron omeprazole. I am also having her start on azithromycin. Additionally, I am having her maintain her aspirin, metoprolol succinate, rosuvastatin,  metFORMIN, famotidine, cyanocobalamin, sitaGLIPtin, and glipiZIDE.  Meds ordered this encounter  Medications  . azithromycin (ZITHROMAX) 500 MG tablet    Sig: Take 1 tablet (500 mg total) by mouth daily.    Dispense:  7 tablet    Refill:  0    Medications Discontinued During This Encounter  Medication Reason  . omeprazole (PRILOSEC OTC) 20 MG tablet Change in therapy    Follow-up: Return for fasting labs on or after dec 14th.   Crecencio Mc, MD

## 2016-06-26 NOTE — Patient Instructions (Signed)
I am treating you for pneumonia based on your lung exam and historyr  azithromycin 500 mg daily for 7 days  Please take a probiotic ( Align, [place] or Culturelle), the generic version of one of these over the counter medications, or an alternative form (kombucha,  Yogurt, or another dietary source) for a minimum of 3 weeks to prevent a serious antibiotic associated diarrhea  Called clostridium dificile colitis.  Taking a probiotic may also prevent vaginitis due to yeast infections and can be continued indefinitely if you feel that it improves your digestion or your elimination (bowels).

## 2016-06-26 NOTE — Progress Notes (Signed)
Pre-visit discussion using our clinic review tool. No additional management support is needed unless otherwise documented below in the visit note.  

## 2016-06-28 DIAGNOSIS — J189 Pneumonia, unspecified organism: Secondary | ICD-10-CM | POA: Insufficient documentation

## 2016-06-28 NOTE — Assessment & Plan Note (Addendum)
Confirmed by chest x ray done today.  Will treat with azithromycin 500 mg x 7 days.  Return to clinic of no improvement in 48 hours,  Repeat chest x ray will be needed in 4 to 6 weeks

## 2016-06-28 NOTE — Assessment & Plan Note (Addendum)
Managed with metformin and Januvia.  No microalbuminuria. A .  She is tolerating daily use of a baby aspirin and a statin . Return next week for a1c and fasting labs .   Lab Results  Component Value Date   HGBA1C 7.3 (H) 03/31/2016   Lab Results  Component Value Date   MICROALBUR 1.0 03/31/2016

## 2016-07-03 ENCOUNTER — Other Ambulatory Visit (INDEPENDENT_AMBULATORY_CARE_PROVIDER_SITE_OTHER): Payer: BC Managed Care – PPO

## 2016-07-03 DIAGNOSIS — R05 Cough: Secondary | ICD-10-CM

## 2016-07-03 DIAGNOSIS — R059 Cough, unspecified: Secondary | ICD-10-CM

## 2016-07-03 DIAGNOSIS — E119 Type 2 diabetes mellitus without complications: Secondary | ICD-10-CM

## 2016-07-03 DIAGNOSIS — E7800 Pure hypercholesterolemia, unspecified: Secondary | ICD-10-CM | POA: Diagnosis not present

## 2016-07-03 LAB — COMPREHENSIVE METABOLIC PANEL
ALT: 12 U/L (ref 0–35)
AST: 14 U/L (ref 0–37)
Albumin: 4.2 g/dL (ref 3.5–5.2)
Alkaline Phosphatase: 82 U/L (ref 39–117)
BUN: 19 mg/dL (ref 6–23)
CALCIUM: 9.5 mg/dL (ref 8.4–10.5)
CHLORIDE: 103 meq/L (ref 96–112)
CO2: 29 meq/L (ref 19–32)
Creatinine, Ser: 0.69 mg/dL (ref 0.40–1.20)
GFR: 91.03 mL/min (ref >60.00)
Glucose, Bld: 120 mg/dL — ABNORMAL HIGH (ref 70–99)
Potassium: 4.4 mEq/L (ref 3.5–5.1)
Sodium: 140 mEq/L (ref 135–145)
Total Bilirubin: 0.3 mg/dL (ref 0.2–1.2)
Total Protein: 6.6 g/dL (ref 6.0–8.3)

## 2016-07-03 LAB — LIPID PANEL
CHOLESTEROL: 121 mg/dL (ref 0–200)
HDL: 57.8 mg/dL (ref >39.00)
LDL Cholesterol: 41 mg/dL (ref 0–99)
NonHDL: 63.51
Total CHOL/HDL Ratio: 2
Triglycerides: 113 mg/dL (ref 0.0–149.0)
VLDL: 22.6 mg/dL (ref 0.0–40.0)

## 2016-07-03 LAB — CBC WITH DIFFERENTIAL/PLATELET
BASOS PCT: 0.6 % (ref 0.0–3.0)
Basophils Absolute: 0 10*3/uL (ref 0.0–0.1)
EOS PCT: 3.1 % (ref 0.0–5.0)
Eosinophils Absolute: 0.2 10*3/uL (ref 0.0–0.7)
HCT: 37.3 % (ref 36.0–46.0)
Hemoglobin: 12.6 g/dL (ref 12.0–15.0)
LYMPHS ABS: 1.5 10*3/uL (ref 0.7–4.0)
Lymphocytes Relative: 31.8 % (ref 12.0–46.0)
MCHC: 33.8 g/dL (ref 30.0–36.0)
MCV: 89.2 fl (ref 78.0–100.0)
MONO ABS: 0.5 10*3/uL (ref 0.1–1.0)
Monocytes Relative: 10.7 % (ref 3.0–12.0)
NEUTROS PCT: 53.8 % (ref 43.0–77.0)
Neutro Abs: 2.6 10*3/uL (ref 1.4–7.7)
Platelets: 192 10*3/uL (ref 150.0–400.0)
RBC: 4.19 Mil/uL (ref 3.87–5.11)
RDW: 13.7 % (ref 11.5–15.5)
WBC: 4.8 10*3/uL (ref 4.0–10.5)

## 2016-07-03 LAB — HEMOGLOBIN A1C: Hgb A1c MFr Bld: 7 % — ABNORMAL HIGH (ref 4.6–6.5)

## 2016-07-03 LAB — LDL CHOLESTEROL, DIRECT: LDL DIRECT: 44 mg/dL

## 2016-07-03 NOTE — Addendum Note (Signed)
Addended by: Leeanne Rio on: 07/03/2016 08:44 AM   Modules accepted: Orders

## 2016-07-16 ENCOUNTER — Telehealth: Payer: Self-pay | Admitting: Internal Medicine

## 2016-07-16 NOTE — Telephone Encounter (Signed)
Pt called and stated that she is still having popping and crackling in the rt lung even after her round of azithromycin (ZITHROMAX) 500 MG tablet. Pt is not sure if Dr. Derrel Nip wanted to prescribe something else or have her return. Please advise, thank you!  Call pt @ [phone]

## 2016-07-16 NOTE — Telephone Encounter (Signed)
Patient took Zithromax on 06/29/16 , still coughing when takes deep breath , she can feel rattling and popping in lower right lung    Please advise.

## 2016-07-17 MED ORDER — PREDNISONE 10 MG PO TABS: ORAL_TABLET | ORAL | 0 refills | Status: DC

## 2016-07-17 MED ORDER — BENZONATATE 100 MG PO CAPS: 100.0000 mg | ORAL_CAPSULE | Freq: Two times a day (BID) | ORAL | 0 refills | Status: DC | PRN

## 2016-07-17 NOTE — Telephone Encounter (Signed)
The cough may last several weeks,  I would not prescribe more antibiotics unless she is having fevers,increased shortness of breath,  Worsening or productive cough,  Needs appt next week .  I will prescribe a prednionee taper and a cough suppressant to use in the meantime, so let me know about the above symptoms

## 2016-07-17 NOTE — Telephone Encounter (Signed)
Left message to call on cell phone and home phone.

## 2016-07-23 NOTE — Telephone Encounter (Signed)
Patient advised of below and verbalized an understanding.  She got scripts filled and is feeling much better.   Patient declined to schedule appointment at this time to follow up .

## 2016-08-20 ENCOUNTER — Ambulatory Visit
Admission: RE | Admit: 2016-08-20 | Discharge: 2016-08-20 | Disposition: A | Discharge status: Home / Self Care | Payer: BC Managed Care – PPO | Source: Ambulatory Visit | Attending: Internal Medicine | Admitting: Internal Medicine

## 2016-08-20 DIAGNOSIS — Z1231 Encounter for screening mammogram for malignant neoplasm of breast: Secondary | ICD-10-CM | POA: Insufficient documentation

## 2016-08-20 DIAGNOSIS — Z1239 Encounter for other screening for malignant neoplasm of breast: Secondary | ICD-10-CM

## 2016-08-21 ENCOUNTER — Encounter: Payer: Self-pay | Admitting: Internal Medicine

## 2016-08-21 ENCOUNTER — Ambulatory Visit (INDEPENDENT_AMBULATORY_CARE_PROVIDER_SITE_OTHER): Payer: BC Managed Care – PPO

## 2016-08-21 ENCOUNTER — Ambulatory Visit (INDEPENDENT_AMBULATORY_CARE_PROVIDER_SITE_OTHER): Payer: BC Managed Care – PPO | Admitting: Internal Medicine

## 2016-08-21 VITALS — BP 138/80 | HR 63 | Temp 97.5°F | Resp 16 | Wt 153.0 lb

## 2016-08-21 DIAGNOSIS — E538 Deficiency of other specified B group vitamins: Secondary | ICD-10-CM

## 2016-08-21 DIAGNOSIS — J181 Lobar pneumonia, unspecified organism: Secondary | ICD-10-CM

## 2016-08-21 DIAGNOSIS — J189 Pneumonia, unspecified organism: Secondary | ICD-10-CM

## 2016-08-21 MED ORDER — CYANOCOBALAMIN 1000 MCG/15ML PO LIQD: 15.0000 mL | Freq: Every day | ORAL | 11 refills | Status: DC

## 2016-08-21 NOTE — Progress Notes (Signed)
Pre visit review using our clinic review tool, if applicable. No additional management support is needed unless otherwise documented below in the visit note. 

## 2016-08-21 NOTE — Patient Instructions (Signed)
I AM SWITCHING YOU TO SUBLINGUAL B12 SUPPLEMENTATION   IF YOUR CHEST X RAY IS STILL ABNORMAL.  I WILL ORDER A CT SCAN TO EVALUATE IT WITH MORE DETAIL

## 2016-08-21 NOTE — Progress Notes (Signed)
Subjective:  Patient ID: Dana Ramirez, female    DOB: [birth year]  Age: 66 y.o. MRN: [id number]  CC: The primary encounter diagnosis was Community acquired pneumonia of right lower lobe of lung ([place]). A diagnosis of B12 deficiency was also pertinent to this visit.  HPI Dana Ramirez presents for FOLLOW UP ON PNEUMONIA DIAGNOSED WITH CHEST X RAY SHOWING BILATERAL OPACITIES R>L  ON DEC 8TH,   Patient returns for follow up reporting an improvement in all symptoms.fevers have resolved but she continues to have a non productive cough that is intermittent and not accompanied by dyspnea  FEELS BETTER,  NO FEVERS,  HAS A LITTLE DRY COUGH, BROUGHT ON BY DEEP BREATHING,  STILL FON RIGHT SIDE    DOESN'T WANT TO CONTINUE B123 INJECTIONS DUE TO DISCOMFORT   Outpatient Medications Prior to Visit  Medication Sig Dispense Refill  . aspirin (ASPIR-LOW) 81 MG EC tablet Take 162 mg by mouth daily.      . famotidine (PEPCID) 20 MG tablet Take 1 tablet (20 mg total) by mouth 2 (two) times daily. 60 tablet 5  . glipiZIDE (GLIPIZIDE XL) 2.5 MG 24 hr tablet TAKE 1 TABLET (2.5 MG TOTAL) BY MOUTH DAILY WITH BREAKFAST. 30 tablet 5  . metFORMIN (GLUCOPHAGE) 1000 MG tablet Take 1 tablet (1,000 mg total) by mouth daily with breakfast. 90 tablet 2  . metoprolol succinate (TOPROL-XL) 25 MG 24 hr tablet TAKE 1 TABLET DAILY 90 tablet 3  . rosuvastatin (CRESTOR) 20 MG tablet Take 1 tablet (20 mg total) by mouth daily. 90 tablet 3  . sitaGLIPtin (JANUVIA) 100 MG tablet TAKE 1 TABLET (100 MG TOTAL) BY MOUTH DAILY. 30 tablet 5  . azithromycin (ZITHROMAX) 500 MG tablet Take 1 tablet (500 mg total) by mouth daily. 7 tablet 0  . cyanocobalamin (,VITAMIN B-12,) 1000 MCG/ML injection Inject 1 mL (1,000 mcg total) into the muscle every 30 (thirty) days. Give 2 weekly shots '1000mg'$  then switch to monthly '1000mg'$  for 3 months 10 mL 0  . predniSONE (DELTASONE) 10 MG tablet 6 tablets on Day 1 , then reduce by 1 tablet daily  until gone 21 tablet 0  . benzonatate (TESSALON) 100 MG capsule Take 1 capsule (100 mg total) by mouth 2 (two) times daily as needed for cough. (Patient not taking: Reported on 08/21/2016) 20 capsule 0   No facility-administered medications prior to visit.     Review of Systems;  Patient denies headache, fevers, malaise, unintentional weight loss, skin rash, eye pain, sinus congestion and sinus pain, sore throat, dysphagia,  hemoptysis , dyspnea, wheezing, chest pain, palpitations, orthopnea, edema, abdominal pain, nausea, melena, diarrhea, constipation, flank pain, dysuria, hematuria, urinary  Frequency, nocturia, numbness, tingling, seizures,  Focal weakness, Loss of consciousness,  Tremor, insomnia, depression, anxiety, and suicidal ideation.      Objective:  BP 138/80   Pulse 63   Temp 97.5 F (36.4 C) (Oral)   Resp 16   Wt 153 lb (69.4 kg)   SpO2 98%   BMI 27.10 kg/m   BP Readings from Last 3 Encounters:  08/21/16 138/80  06/26/16 124/82  04/06/16 110/66    Wt Readings from Last 3 Encounters:  08/21/16 153 lb (69.4 kg)  06/26/16 153 lb 4 oz (69.5 kg)  04/06/16 156 lb (70.8 kg)    General appearance: alert, cooperative and appears stated age Ears: normal TM's and external ear canals both ears Throat: lips, mucosa, and tongue normal; teeth and gums normal Neck: no adenopathy,  no carotid bruit, supple, symmetrical, trachea midline and thyroid not enlarged, symmetric, no tenderness/mass/nodules Back: symmetric, no curvature. ROM normal. No CVA tenderness. Lungs: coarse crackles at right base. No egophony or wheezing.  Heart: regular rate and rhythm, S1, S2 normal, no murmur, click, rub or gallop Abdomen: soft, non-tender; bowel sounds normal; no masses,  no organomegaly Pulses: 2+ and symmetric Skin: Skin color, texture, turgor normal. No rashes or lesions Lymph nodes: Cervical, supraclavicular, and axillary nodes normal.  Lab Results  Component Value Date   HGBA1C 7.0  (H) 07/03/2016   HGBA1C 7.3 (H) 03/31/2016   HGBA1C 7.0 11/29/2015    Lab Results  Component Value Date   CREATININE 0.69 07/03/2016   CREATININE 0.66 03/31/2016   CREATININE 0.60 12/26/2015    Lab Results  Component Value Date   WBC 4.8 07/03/2016   HGB 12.6 07/03/2016   HCT 37.3 07/03/2016   PLT 192.0 07/03/2016   GLUCOSE 120 (H) 07/03/2016   CHOL 121 07/03/2016   TRIG 113.0 07/03/2016   HDL 57.80 07/03/2016   LDLDIRECT 44.0 07/03/2016   LDLCALC 41 07/03/2016   ALT 12 07/03/2016   AST 14 07/03/2016   NA 140 07/03/2016   K 4.4 07/03/2016   CL 103 07/03/2016   CREATININE 0.69 07/03/2016   BUN 19 07/03/2016   CO2 29 07/03/2016   TSH 5.020 (H) 08/05/2015   HGBA1C 7.0 (H) 07/03/2016   MICROALBUR 1.0 03/31/2016    No results found.  Assessment & Plan:   Problem List Items Addressed This Visit    B12 deficiency    Diagnosed recently and treated with  I M injections  Switching to sublingual       CAP (community acquired pneumonia) - Primary    Clinically recovered, but she has rales at the right base.  Repeat chest x rays shows stable scarring on the right lower lobe       Relevant Orders   DG Chest 2 View (Completed)      I have discontinued Ms. Sommerfeld cyanocobalamin, azithromycin, and predniSONE. I am also having her start on Cyanocobalamin. Additionally, I am having her maintain her aspirin, metoprolol succinate, rosuvastatin, metFORMIN, famotidine, sitaGLIPtin, glipiZIDE, and benzonatate.  Meds ordered this encounter  Medications  . Cyanocobalamin 1000 MCG/15ML LIQD    Sig: Take 15 mLs (1,000 mcg total) by mouth daily.    Dispense:  450 mL    Refill:  11    Medications Discontinued During This Encounter  Medication Reason  . azithromycin (ZITHROMAX) 500 MG tablet Completed Course  . predniSONE (DELTASONE) 10 MG tablet Completed Course  . cyanocobalamin (,VITAMIN B-12,) 1000 MCG/ML injection     Follow-up: Return in about 3 months (around 11/18/2016)  for follow up diabetes.   Crecencio Mc, MD

## 2016-08-23 NOTE — Assessment & Plan Note (Signed)
Diagnosed recently and treated with  I M injections  Switching to sublingual

## 2016-08-23 NOTE — Assessment & Plan Note (Signed)
Clinically recovered, but she has rales at the right base.  Repeat chest x rays shows stable scarring on the right lower lobe

## 2016-09-21 ENCOUNTER — Other Ambulatory Visit: Payer: Self-pay | Admitting: Internal Medicine

## 2016-09-24 ENCOUNTER — Other Ambulatory Visit: Payer: Self-pay | Admitting: Internal Medicine

## 2016-09-24 ENCOUNTER — Telehealth: Payer: Self-pay | Admitting: Internal Medicine

## 2016-09-24 ENCOUNTER — Telehealth: Payer: Self-pay | Admitting: Radiology

## 2016-09-24 DIAGNOSIS — E538 Deficiency of other specified B group vitamins: Secondary | ICD-10-CM

## 2016-09-24 DIAGNOSIS — E038 Other specified hypothyroidism: Secondary | ICD-10-CM

## 2016-09-24 DIAGNOSIS — I10 Essential (primary) hypertension: Secondary | ICD-10-CM

## 2016-09-24 DIAGNOSIS — E119 Type 2 diabetes mellitus without complications: Secondary | ICD-10-CM

## 2016-09-24 DIAGNOSIS — E039 Hypothyroidism, unspecified: Secondary | ICD-10-CM

## 2016-09-24 DIAGNOSIS — E7800 Pure hypercholesterolemia, unspecified: Secondary | ICD-10-CM

## 2016-09-24 NOTE — Telephone Encounter (Signed)
Per Dr. Derrel Nip pt can not have lab appt before March 15th, can you call pt and reschedule?

## 2016-09-24 NOTE — Telephone Encounter (Signed)
She cannot have  labs done before march 15 ;  Please change appt

## 2016-09-24 NOTE — Telephone Encounter (Signed)
Ok to keep the march 12 lab.  Only the a1c can't be done early so we'll do it at her visit

## 2016-09-24 NOTE — Telephone Encounter (Signed)
Pt coming in for labs Monday, please place future orders. Thank you 

## 2016-09-28 ENCOUNTER — Other Ambulatory Visit: Payer: BC Managed Care – PPO

## 2016-10-01 ENCOUNTER — Other Ambulatory Visit (INDEPENDENT_AMBULATORY_CARE_PROVIDER_SITE_OTHER): Payer: BC Managed Care – PPO

## 2016-10-01 DIAGNOSIS — E039 Hypothyroidism, unspecified: Secondary | ICD-10-CM | POA: Diagnosis not present

## 2016-10-01 DIAGNOSIS — E119 Type 2 diabetes mellitus without complications: Secondary | ICD-10-CM

## 2016-10-01 DIAGNOSIS — I10 Essential (primary) hypertension: Secondary | ICD-10-CM | POA: Diagnosis not present

## 2016-10-01 DIAGNOSIS — E538 Deficiency of other specified B group vitamins: Secondary | ICD-10-CM | POA: Diagnosis not present

## 2016-10-01 DIAGNOSIS — E7800 Pure hypercholesterolemia, unspecified: Secondary | ICD-10-CM

## 2016-10-01 DIAGNOSIS — E038 Other specified hypothyroidism: Secondary | ICD-10-CM

## 2016-10-01 LAB — LIPID PANEL
Cholesterol: 133 mg/dL (ref 0–200)
HDL: 60.8 mg/dL (ref >39.00)
LDL Cholesterol: 46 mg/dL (ref 0–99)
NONHDL: 71.99
Total CHOL/HDL Ratio: 2
Triglycerides: 129 mg/dL (ref 0.0–149.0)
VLDL: 25.8 mg/dL (ref 0.0–40.0)

## 2016-10-01 LAB — COMPREHENSIVE METABOLIC PANEL
ALT: 11 U/L (ref 0–35)
AST: 15 U/L (ref 0–37)
Albumin: 4.4 g/dL (ref 3.5–5.2)
Alkaline Phosphatase: 86 U/L (ref 39–117)
BILIRUBIN TOTAL: 0.4 mg/dL (ref 0.2–1.2)
BUN: 16 mg/dL (ref 6–23)
CALCIUM: 9.9 mg/dL (ref 8.4–10.5)
CHLORIDE: 103 meq/L (ref 96–112)
CO2: 29 mEq/L (ref 19–32)
CREATININE: 0.69 mg/dL (ref 0.40–1.20)
GFR: 90.96 mL/min (ref >60.00)
GLUCOSE: 127 mg/dL — AB (ref 70–99)
Potassium: 4.4 mEq/L (ref 3.5–5.1)
SODIUM: 141 meq/L (ref 135–145)
Total Protein: 7.1 g/dL (ref 6.0–8.3)

## 2016-10-01 LAB — MICROALBUMIN / CREATININE URINE RATIO
CREATININE, U: 142.3 mg/dL
MICROALB UR: 3.7 mg/dL — AB (ref 0.0–1.9)
Microalb Creat Ratio: 2.6 mg/g (ref 0.0–30.0)

## 2016-10-01 LAB — VITAMIN B12

## 2016-10-01 LAB — TSH: TSH: 5.23 u[IU]/mL — AB (ref 0.35–4.50)

## 2016-10-01 LAB — LDL CHOLESTEROL, DIRECT: Direct LDL: 40 mg/dL

## 2016-10-02 ENCOUNTER — Ambulatory Visit (INDEPENDENT_AMBULATORY_CARE_PROVIDER_SITE_OTHER): Payer: BC Managed Care – PPO | Admitting: Internal Medicine

## 2016-10-02 ENCOUNTER — Encounter: Payer: Self-pay | Admitting: Internal Medicine

## 2016-10-02 VITALS — BP 110/60 | HR 67 | Temp 97.8°F | Resp 15 | Ht 63.0 in | Wt 156.6 lb

## 2016-10-02 DIAGNOSIS — E7800 Pure hypercholesterolemia, unspecified: Secondary | ICD-10-CM

## 2016-10-02 DIAGNOSIS — E785 Hyperlipidemia, unspecified: Secondary | ICD-10-CM

## 2016-10-02 DIAGNOSIS — E038 Other specified hypothyroidism: Secondary | ICD-10-CM

## 2016-10-02 DIAGNOSIS — E119 Type 2 diabetes mellitus without complications: Secondary | ICD-10-CM | POA: Diagnosis not present

## 2016-10-02 DIAGNOSIS — I10 Essential (primary) hypertension: Secondary | ICD-10-CM

## 2016-10-02 DIAGNOSIS — B9789 Other viral agents as the cause of diseases classified elsewhere: Secondary | ICD-10-CM | POA: Diagnosis not present

## 2016-10-02 DIAGNOSIS — E039 Hypothyroidism, unspecified: Secondary | ICD-10-CM

## 2016-10-02 DIAGNOSIS — E1169 Type 2 diabetes mellitus with other specified complication: Secondary | ICD-10-CM | POA: Diagnosis not present

## 2016-10-02 DIAGNOSIS — J069 Acute upper respiratory infection, unspecified: Secondary | ICD-10-CM

## 2016-10-02 LAB — POCT GLYCOSYLATED HEMOGLOBIN (HGB A1C): HEMOGLOBIN A1C: 6.5

## 2016-10-02 MED ORDER — LEVOFLOXACIN 500 MG PO TABS: 500.0000 mg | ORAL_TABLET | Freq: Every day | ORAL | 0 refills | Status: DC

## 2016-10-02 MED ORDER — PREDNISONE 10 MG PO TABS: ORAL_TABLET | ORAL | 0 refills | Status: DC

## 2016-10-02 NOTE — Progress Notes (Signed)
Pre visit review using our clinic review tool, if applicable. No additional management support is needed unless otherwise documented below in the visit note. 

## 2016-10-02 NOTE — Progress Notes (Signed)
Subjective:  Patient ID: Dana Ramirez, female    DOB: Feb 01, 1951  Age: 66 y.o. MRN: [id number]  CC: The primary encounter diagnosis was Diabetes mellitus without complication ([place]). Diagnoses of Hyperlipidemia associated with type 2 diabetes mellitus ([place]), Essential hypertension, Subclinical hypothyroidism, Pure hypercholesterolemia, and Viral URI were also pertinent to this visit.  HPI Meghin Thivierge presents for follow up on recent treatment for community acquired pneumonia, and follow up on type 2 diabetes, hyperlipidemia and hypertension .  Her pneumonia has resolved clinically,  But she has been having sinus congestion and left sided facial pain for the last 4 days. Sinus drainage is reported as clear but has been at ties streaked with scant amounts of blood,  Which is reported as chronic due to the lack of humidity in her  house .  3 month follow up on diabetes:  Patient has no complaints today.  Patient is following a low glycemic index diet and taking all prescribed medications regularly without side effects.  Fasting sugars have been under less than 140 most of the time and post prandials have been under 160 except on rare occasions. Patient is not exercising and not intentionally trying to lose weight .  Patient has had an eye exam in the last 12 months and checks feet regularly for signs of infection.  Patient does not walk barefoot outside,  And denies any numbness tingling or burning in feet. Patient is up to date on all recommended vaccinations.   Outpatient Medications Prior to Visit  Medication Sig Dispense Refill  . aspirin (ASPIR-LOW) 81 MG EC tablet Take 162 mg by mouth daily.      . Cyanocobalamin 1000 MCG/15ML LIQD Take 15 mLs (1,000 mcg total) by mouth daily. 450 mL 11  . famotidine (PEPCID) 20 MG tablet TAKE ONE TABLET BY MOUTH TWICE A DAY 60 tablet 4  . glipiZIDE (GLIPIZIDE XL) 2.5 MG 24 hr tablet TAKE 1 TABLET (2.5 MG TOTAL) BY MOUTH DAILY WITH BREAKFAST.  30 tablet 5  . metFORMIN (GLUCOPHAGE) 1000 MG tablet Take 1 tablet (1,000 mg total) by mouth daily with breakfast. 90 tablet 2  . metoprolol succinate (TOPROL-XL) 25 MG 24 hr tablet TAKE 1 TABLET DAILY 90 tablet 3  . rosuvastatin (CRESTOR) 20 MG tablet Take 1 tablet (20 mg total) by mouth daily. 90 tablet 3  . sitaGLIPtin (JANUVIA) 100 MG tablet TAKE 1 TABLET (100 MG TOTAL) BY MOUTH DAILY. 30 tablet 5  . benzonatate (TESSALON) 100 MG capsule Take 1 capsule (100 mg total) by mouth 2 (two) times daily as needed for cough. (Patient not taking: Reported on 08/21/2016) 20 capsule 0   No facility-administered medications prior to visit.     Review of Systems;  Patient denies headache, fevers, malaise, unintentional weight loss, skin rash, eye pain,  sore throat, dysphagia,  hemoptysis , cough, dyspnea, wheezing, chest pain, palpitations, orthopnea, edema, abdominal pain, nausea, melena, diarrhea, constipation, flank pain, dysuria, hematuria, urinary  Frequency, nocturia, numbness, tingling, seizures,  Focal weakness, Loss of consciousness,  Tremor, insomnia, depression, anxiety, and suicidal ideation.      Objective:  BP 110/60 (BP Location: Left Arm, Patient Position: Sitting, Cuff Size: Normal)   Pulse 67   Temp 97.8 F (36.6 C) (Oral)   Resp 15   Ht '5\' 3"'$  (1.6 m)   Wt 156 lb 9.6 oz (71 kg)   SpO2 97%   BMI 27.74 kg/m   BP Readings from Last 3 Encounters:  10/02/16 110/60  08/21/16 138/80  06/26/16 124/82    Wt Readings from Last 3 Encounters:  10/02/16 156 lb 9.6 oz (71 kg)  08/21/16 153 lb (69.4 kg)  06/26/16 153 lb 4 oz (69.5 kg)    General appearance: alert, cooperative and appears stated age Ears: normal TM's and external ear canals both ears Face: no facial tenderness or swelling  Throat: lips, mucosa, and tongue normal; teeth and gums normal Neck: no adenopathy, no carotid bruit, supple, symmetrical, trachea midline and thyroid not enlarged, symmetric, no  tenderness/mass/nodules Back: symmetric, no curvature. ROM normal. No CVA tenderness. Lungs: clear to auscultation bilaterally Heart: regular rate and rhythm, S1, S2 normal, no murmur, click, rub or gallop Abdomen: soft, non-tender; bowel sounds normal; no masses,  no organomegaly Pulses: 2+ and symmetric Skin: Skin color, texture, turgor normal. No rashes or lesions Lymph nodes: Cervical, supraclavicular, and axillary nodes normal.  Lab Results  Component Value Date   HGBA1C 6.5 10/02/2016   HGBA1C 7.0 (H) 07/03/2016   HGBA1C 7.3 (H) 03/31/2016    Lab Results  Component Value Date   CREATININE 0.69 10/01/2016   CREATININE 0.69 07/03/2016   CREATININE 0.66 03/31/2016    Lab Results  Component Value Date   WBC 4.8 07/03/2016   HGB 12.6 07/03/2016   HCT 37.3 07/03/2016   PLT 192.0 07/03/2016   GLUCOSE 127 (H) 10/01/2016   CHOL 133 10/01/2016   TRIG 129.0 10/01/2016   HDL 60.80 10/01/2016   LDLDIRECT 40.0 10/01/2016   LDLCALC 46 10/01/2016   ALT 11 10/01/2016   AST 15 10/01/2016   NA 141 10/01/2016   K 4.4 10/01/2016   CL 103 10/01/2016   CREATININE 0.69 10/01/2016   BUN 16 10/01/2016   CO2 29 10/01/2016   TSH 5.23 (H) 10/01/2016   HGBA1C 6.5 10/02/2016   MICROALBUR 3.7 (H) 10/01/2016    Dg Chest 2 View  Result Date: 08/21/2016 CLINICAL DATA:  Follow-up bilateral infiltrates, persistent cough EXAM: CHEST  2 VIEW COMPARISON:  06/26/2016, 12/29/2014 FINDINGS: Cardiac shadow is within normal limits. Postsurgical changes are noted. Persistent changes in the right lung base are noted. These were previously noted on prior CT from 2016 and likely represent scarring. No acute infiltrate or sizable effusion is noted. Left lung base is clear. No bony abnormality is seen. IMPRESSION: Interval clearing of left basilar changes. Stable changes in the right base are noted dating back to 2016 consistent with scarring. Electronically Signed   By: Inez Catalina M.D.   On: 08/21/2016 11:22     Mm Screening Breast Tomo Bilateral  Result Date: 08/21/2016 CLINICAL DATA:  Screening. EXAM: 2D DIGITAL SCREENING BILATERAL MAMMOGRAM WITH CAD AND ADJUNCT TOMO COMPARISON:  Previous exam(s). ACR Breast Density Category b: There are scattered areas of fibroglandular density. FINDINGS: There are no findings suspicious for malignancy. Images were processed with CAD. IMPRESSION: No mammographic evidence of malignancy. A result letter of this screening mammogram will be mailed directly to the patient. RECOMMENDATION: Screening mammogram in one year. (Code:SM-B-01Y) BI-RADS CATEGORY  1: Negative. Electronically Signed   By: Fidela Salisbury M.D.   On: 08/21/2016 16:34    Assessment & Plan:   Problem List Items Addressed This Visit    Diabetes mellitus without complication ([place]) - Primary    Managed with metformin, glipizide and Januvia.  She has minimal microalbuminuria, but has no room for addition of an ARB given her intolerance to ace inhibitor and bp of 110/60. Marland Kitchen  She is tolerating daily use of  a baby aspirin and a statin .   Lab Results  Component Value Date   HGBA1C 6.5 10/02/2016   Lab Results  Component Value Date   MICROALBUR 3.7 (H) 10/01/2016         Relevant Orders   POCT HgB A1C (Completed)   Comprehensive metabolic panel   Hemoglobin A1c   Lipid panel   Microalbumin / creatinine urine ratio   Hyperlipidemia    MANAGED WITH CRESTOR. LDL is at goal,  She has no muscle pain. Liver enzymes are normal. No changes today   Lab Results  Component Value Date   CHOL 133 10/01/2016   HDL 60.80 10/01/2016   LDLCALC 46 10/01/2016   LDLDIRECT 40.0 10/01/2016   TRIG 129.0 10/01/2016   CHOLHDL 2 10/01/2016   Lab Results  Component Value Date   ALT 11 10/01/2016   AST 15 10/01/2016   ALKPHOS 86 10/01/2016   BILITOT 0.4 10/01/2016         Hypertension    Well controlled on current regimen. Renal function stable, no changes today.  Lab Results  Component Value Date    CREATININE 0.69 10/01/2016   Lab Results  Component Value Date   NA 141 10/01/2016   K 4.4 10/01/2016   CL 103 10/01/2016   CO2 29 10/01/2016         Subclinical hypothyroidism    She remains  asymptomatic . Will repeat in 3 months   Lab Results  Component Value Date   TSH 5.23 (H) 10/01/2016         Viral URI    URI is most likely viral given the mild HEENT Symptoms  And normal exam.   I have explained that in viral URIS, an antibiotic will not help the symptoms and will increase the risk of developing diarrhea.,  Continue oral and nasal decongestants,prn tylenol 650 mq 8 hrs for aches and pains,  Sinus flushes with Milta Deiters Med's rinse,  prednisone  taper for inflammation, and cough suppressant.   Advised to start antibiotic only if symptoms of bacterial sinusitis occur, and advised to take probiotic if the antibiotic is taken,  For a minimum of 3 weeks.        Other Visit Diagnoses    Hyperlipidemia associated with type 2 diabetes mellitus ([place])       Relevant Orders   LDL cholesterol, direct      I have discontinued Ms. Seward benzonatate. I am also having her start on predniSONE and levofloxacin. Additionally, I am having her maintain her aspirin, metoprolol succinate, rosuvastatin, metFORMIN, sitaGLIPtin, glipiZIDE, Cyanocobalamin, and famotidine.  Meds ordered this encounter  Medications  . predniSONE (DELTASONE) 10 MG tablet    Sig: 6 tablets on Day 1 , then reduce by 1 tablet daily until gone    Dispense:  21 tablet    Refill:  0  . levofloxacin (LEVAQUIN) 500 MG tablet    Sig: Take 1 tablet (500 mg total) by mouth daily.    Dispense:  7 tablet    Refill:  0    Medications Discontinued During This Encounter  Medication Reason  . benzonatate (TESSALON) 100 MG capsule Patient has not taken in last 30 days    Follow-up: Return in about 6 months (around 04/04/2017) for follow up diabetes, fasting labs prior .   Crecencio Mc, MD

## 2016-10-02 NOTE — Patient Instructions (Addendum)
Your diabetes  Is now under EXCELLENT  control  And your cholesterol and other labs are also excellent !   Please continue your current medications. return in 6 months for follow up on diabetes and make sure you are seeing your eye doctor at least once a year.   You have a viral  Syndrome .  The post nasal drip is causing your sore throat and cough .  Flush your sinuses once or  twice daily with  NeilMed's sinus rinse.  Prednisone taper for the inflammation   You can Use benadryl 25 mg at bedtime  for the post nasal drip and Afrin nasal spray every 12 hours as needed for the congestion.  Gargle with salt water as needed for the sore throat.  If you develop a fever (T > 100.4) ,  Green nasal discharge,  Or worsening ear or facial pain,  Start the Levaquin antibiotic    Please take a probiotic ( Align, [place]ora[place] or Culturelle), the generic version of one of these over the counter medications, or a PROBIOTIC BEVERAGE  (kombucha,  [place] ) Yogurt, or another dietary source) for a minimum of 3 weeks to prevent a serious antibiotic associated diarrhea  Called clostridium dificile colitis.  Taking a probiotic may also prevent vaginitis due to yeast infections and can be continued indefinitely if you feel that it improves your digestion or your elimination (bowels).Marland Kitchen

## 2016-10-03 ENCOUNTER — Encounter: Payer: Self-pay | Admitting: Internal Medicine

## 2016-10-03 ENCOUNTER — Other Ambulatory Visit: Payer: Self-pay | Admitting: Internal Medicine

## 2016-10-03 DIAGNOSIS — I10 Essential (primary) hypertension: Secondary | ICD-10-CM

## 2016-10-03 DIAGNOSIS — R7989 Other specified abnormal findings of blood chemistry: Secondary | ICD-10-CM

## 2016-10-03 DIAGNOSIS — E7800 Pure hypercholesterolemia, unspecified: Secondary | ICD-10-CM

## 2016-10-03 DIAGNOSIS — J069 Acute upper respiratory infection, unspecified: Secondary | ICD-10-CM | POA: Insufficient documentation

## 2016-10-03 DIAGNOSIS — E119 Type 2 diabetes mellitus without complications: Secondary | ICD-10-CM

## 2016-10-03 NOTE — Assessment & Plan Note (Signed)
URI is most likely viral given the mild HEENT Symptoms  And normal exam.   I have explained that in viral URIS, an antibiotic will not help the symptoms and will increase the risk of developing diarrhea.,  Continue oral and nasal decongestants,prn tylenol 650 mq 8 hrs for aches and pains,  Sinus flushes with Milta Deiters Med's rinse,  prednisone  taper for inflammation, and cough suppressant.   Advised to start antibiotic only if symptoms of bacterial sinusitis occur, and advised to take probiotic if the antibiotic is taken,  For a minimum of 3 weeks.

## 2016-10-03 NOTE — Assessment & Plan Note (Addendum)
Managed with metformin, glipizide and Januvia.  She has minimal microalbuminuria, but has no room for addition of an ARB given her intolerance to ace inhibitor and bp of 110/60. Marland Kitchen  She is tolerating daily use of a baby aspirin and a statin .   Lab Results  Component Value Date   HGBA1C 6.5 10/02/2016   Lab Results  Component Value Date   MICROALBUR 3.7 (H) 10/01/2016

## 2016-10-03 NOTE — Assessment & Plan Note (Signed)
Well controlled on current regimen. Renal function stable, no changes today.  Lab Results  Component Value Date   CREATININE 0.69 10/01/2016   Lab Results  Component Value Date   NA 141 10/01/2016   K 4.4 10/01/2016   CL 103 10/01/2016   CO2 29 10/01/2016

## 2016-10-03 NOTE — Assessment & Plan Note (Signed)
MANAGED WITH CRESTOR. LDL is at goal,  She has no muscle pain. Liver enzymes are normal. No changes today   Lab Results  Component Value Date   CHOL 133 10/01/2016   HDL 60.80 10/01/2016   LDLCALC 46 10/01/2016   LDLDIRECT 40.0 10/01/2016   TRIG 129.0 10/01/2016   CHOLHDL 2 10/01/2016   Lab Results  Component Value Date   ALT 11 10/01/2016   AST 15 10/01/2016   ALKPHOS 86 10/01/2016   BILITOT 0.4 10/01/2016

## 2016-10-03 NOTE — Assessment & Plan Note (Signed)
She remains  asymptomatic . Will repeat in 3 months   Lab Results  Component Value Date   TSH 5.23 (H) 10/01/2016

## 2016-10-04 NOTE — Progress Notes (Signed)
Cardiology Office Note  Date:  10/05/2016   ID:  Dana Ramirez, DOB 1951-05-14, MRN [id number]  PCP:  Crecencio Mc, MD   Chief Complaint  Patient presents with  . other    6 month follow up. Meds reviewed by the pt. verbally. "doing well."     HPI:  Dana Ramirez is a very pleasant 66 year old woman with a history of coronary artery disease, bypass surgery in September 2006 with a LIMA to the LAD, vein graft to the diagonal, negative stress test in 2010, Less than 39% bilateral carotid disease, who presents for routine followup of her coronary artery disease  In follow-up today she reports doing well, no symptoms concerning for angina No significant chest pain or shortness of breath on exertion No regular exercise program. No new symptoms She feels sugars are running somewhat high  Lab work reviewed with her HBA1C 6.5 Total chol 133, LDL 46  EKG personally reviewed by myself on today's visit shows normal sinus rhythm with rate 74 bpm, no significant ST or T-wave changes  Other past medical history Cholesterol well-controlled for the past 6-7 years prior history of vertigo  Losartan decreased in the past. This helped her lightheaded spells  Cardiac catheterization done September 2006 showed a diffusely diseased LAD from the left main to the midportion with lesion up to 90-95%, 95% ostial diagonal disease.  PMH:   has a past medical history of CAD (coronary artery disease); Carotid arterial disease ([place]); Diabetes mellitus without complication ([place]); History of vertigo; Hyperlipidemia; Hypertension; and Kidney stone.  PSH:    Past Surgical History:  Procedure Laterality Date  . BREAST CYST ASPIRATION Left years ago  . CESAREAN SECTION     x 2  . CHOLECYSTECTOMY    . CORONARY ARTERY BYPASS GRAFT  03/2005   LIMA to the LAD, vein graft to diagonal with a negative stress test in 06/2006  . TONSILLECTOMY      Current Outpatient Prescriptions  Medication Sig Dispense  Refill  . aspirin (ASPIR-LOW) 81 MG EC tablet Take 162 mg by mouth daily.      . Cyanocobalamin 1000 MCG/15ML LIQD Take 15 mLs (1,000 mcg total) by mouth daily. 450 mL 11  . famotidine (PEPCID) 20 MG tablet TAKE ONE TABLET BY MOUTH TWICE A DAY 60 tablet 4  . glipiZIDE (GLIPIZIDE XL) 2.5 MG 24 hr tablet TAKE 1 TABLET (2.5 MG TOTAL) BY MOUTH DAILY WITH BREAKFAST. 30 tablet 5  . levofloxacin (LEVAQUIN) 500 MG tablet Take 1 tablet (500 mg total) by mouth daily. 7 tablet 0  . metFORMIN (GLUCOPHAGE) 1000 MG tablet Take 1 tablet (1,000 mg total) by mouth daily with breakfast. 90 tablet 2  . metoprolol succinate (TOPROL-XL) 25 MG 24 hr tablet TAKE 1 TABLET DAILY 90 tablet 3  . predniSONE (DELTASONE) 10 MG tablet 6 tablets on Day 1 , then reduce by 1 tablet daily until gone 21 tablet 0  . rosuvastatin (CRESTOR) 20 MG tablet Take 1 tablet (20 mg total) by mouth daily. 90 tablet 3  . sitaGLIPtin (JANUVIA) 100 MG tablet TAKE 1 TABLET (100 MG TOTAL) BY MOUTH DAILY. 30 tablet 5   No current facility-administered medications for this visit.      Allergies:   Codeine; Lisinopril; and Penicillins   Social History:  The patient  reports that she has never smoked. She has never used smokeless tobacco. She reports that she does not drink alcohol or use drugs.   Family History:   family  history includes Breast cancer (age of onset: 81) in her paternal grandmother; Coronary artery disease in her other; Diabetes in her brother; Heart disease in her maternal grandfather, maternal grandmother, mother, paternal grandfather, paternal grandmother, and sister; Heart disease (age of onset: 87) in her father; Stroke (age of onset: 57) in her sister.    Review of Systems: Review of Systems  Constitutional: Negative.   Respiratory: Negative.   Cardiovascular: Negative.   Gastrointestinal: Negative.   Musculoskeletal: Negative.   Neurological: Negative.   Psychiatric/Behavioral: Negative.   All other systems reviewed  and are negative.    PHYSICAL EXAM: VS:  BP 122/64 (BP Location: Left Arm, Patient Position: Sitting, Cuff Size: Normal)   Pulse 78   Ht '5\' 3"'$  (1.6 m)   Wt 155 lb 8 oz (70.5 kg)   BMI 27.55 kg/m  , BMI Body mass index is 27.55 kg/m. GEN: Well nourished, well developed, in no acute distress  HEENT: normal  Neck: no JVD, carotid bruits, or masses Cardiac: RRR; no murmurs, rubs, or gallops,no edema  Respiratory:  clear to auscultation bilaterally, normal work of breathing GI: soft, nontender, nondistended, + BS MS: no deformity or atrophy  Skin: warm and dry, no rash Neuro:  Strength and sensation are intact Psych: euthymic mood, full affect    Recent Labs: 07/03/2016: Hemoglobin 12.6; Platelets 192.0 10/01/2016: ALT 11; BUN 16; Creatinine, Ser 0.69; Potassium 4.4; Sodium 141; TSH 5.23    Lipid Panel Lab Results  Component Value Date   CHOL 133 10/01/2016   HDL 60.80 10/01/2016   LDLCALC 46 10/01/2016   TRIG 129.0 10/01/2016      Wt Readings from Last 3 Encounters:  10/05/16 155 lb 8 oz (70.5 kg)  10/02/16 156 lb 9.6 oz (71 kg)  08/21/16 153 lb (69.4 kg)       ASSESSMENT AND PLAN:  Mixed hyperlipidemia - Plan: EKG 12-Lead Cholesterol is at goal on the current lipid regimen. No changes to the medications were made.  Atherosclerosis of native coronary artery of native heart without angina pectoris - Plan: EKG 12-Lead Currently with no symptoms of angina. No further workup at this time. Continue current medication regimen.  Bilateral carotid artery stenosis - Plan: EKG 12-Lead Stressed importance of aggressive diabetes and cholesterol control Periodic ultrasound  Essential hypertension - Plan: EKG 12-Lead Blood pressure is well controlled on today's visit. No changes made to the medications.  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 EKG 12-Lead     Signed, Esmond Plants, M.D., Ph.D. 10/05/2016  [place], [place]

## 2016-10-05 ENCOUNTER — Ambulatory Visit (INDEPENDENT_AMBULATORY_CARE_PROVIDER_SITE_OTHER): Payer: BC Managed Care – PPO | Admitting: Cardiovascular Disease

## 2016-10-05 ENCOUNTER — Encounter: Payer: Self-pay | Admitting: Cardiovascular Disease

## 2016-10-05 VITALS — BP 122/64 | HR 78 | Ht 63.0 in | Wt 155.5 lb

## 2016-10-05 DIAGNOSIS — I2510 Atherosclerotic heart disease of native coronary artery without angina pectoris: Secondary | ICD-10-CM | POA: Diagnosis not present

## 2016-10-05 DIAGNOSIS — E119 Type 2 diabetes mellitus without complications: Secondary | ICD-10-CM | POA: Diagnosis not present

## 2016-10-05 DIAGNOSIS — I10 Essential (primary) hypertension: Secondary | ICD-10-CM | POA: Diagnosis not present

## 2016-10-05 DIAGNOSIS — Z951 Presence of aortocoronary bypass graft: Secondary | ICD-10-CM | POA: Diagnosis not present

## 2016-10-05 DIAGNOSIS — E782 Mixed hyperlipidemia: Secondary | ICD-10-CM | POA: Diagnosis not present

## 2016-10-05 DIAGNOSIS — I6523 Occlusion and stenosis of bilateral carotid arteries: Secondary | ICD-10-CM

## 2016-10-05 NOTE — Patient Instructions (Signed)

## 2016-10-13 ENCOUNTER — Telehealth: Payer: Self-pay

## 2016-10-13 NOTE — Telephone Encounter (Signed)
Pt informed

## 2016-10-13 NOTE — Telephone Encounter (Signed)
LMOV to call office .  Patient was charged no show fee in July by mistake.  This has been removed per Billing Hoy Morn.

## 2016-11-03 ENCOUNTER — Other Ambulatory Visit: Payer: Self-pay | Admitting: Internal Medicine

## 2016-11-03 DIAGNOSIS — E119 Type 2 diabetes mellitus without complications: Secondary | ICD-10-CM

## 2016-11-22 ENCOUNTER — Other Ambulatory Visit: Payer: Self-pay | Admitting: Internal Medicine

## 2016-11-22 DIAGNOSIS — E119 Type 2 diabetes mellitus without complications: Secondary | ICD-10-CM

## 2016-11-24 ENCOUNTER — Other Ambulatory Visit: Payer: Self-pay | Admitting: Internal Medicine

## 2016-11-28 ENCOUNTER — Other Ambulatory Visit: Payer: Self-pay | Admitting: Cardiovascular Disease

## 2017-02-15 ENCOUNTER — Other Ambulatory Visit: Payer: Self-pay | Admitting: Cardiovascular Disease

## 2017-02-17 ENCOUNTER — Other Ambulatory Visit: Payer: Self-pay | Admitting: Internal Medicine

## 2017-02-19 ENCOUNTER — Other Ambulatory Visit: Payer: Self-pay | Admitting: Internal Medicine

## 2017-02-19 DIAGNOSIS — E119 Type 2 diabetes mellitus without complications: Secondary | ICD-10-CM

## 2017-03-04 ENCOUNTER — Telehealth: Payer: Self-pay | Admitting: Cardiovascular Disease

## 2017-03-04 NOTE — Telephone Encounter (Signed)
lmov to schedule 2 yr fu carotid

## 2017-04-01 ENCOUNTER — Other Ambulatory Visit: Payer: Self-pay | Admitting: Cardiovascular Disease

## 2017-04-01 DIAGNOSIS — I6523 Occlusion and stenosis of bilateral carotid arteries: Secondary | ICD-10-CM

## 2017-04-05 ENCOUNTER — Other Ambulatory Visit (INDEPENDENT_AMBULATORY_CARE_PROVIDER_SITE_OTHER): Payer: BC Managed Care – PPO

## 2017-04-05 DIAGNOSIS — E785 Hyperlipidemia, unspecified: Secondary | ICD-10-CM

## 2017-04-05 DIAGNOSIS — E119 Type 2 diabetes mellitus without complications: Secondary | ICD-10-CM | POA: Diagnosis not present

## 2017-04-05 DIAGNOSIS — E1169 Type 2 diabetes mellitus with other specified complication: Secondary | ICD-10-CM

## 2017-04-05 DIAGNOSIS — I10 Essential (primary) hypertension: Secondary | ICD-10-CM

## 2017-04-05 DIAGNOSIS — R7989 Other specified abnormal findings of blood chemistry: Secondary | ICD-10-CM

## 2017-04-05 LAB — COMPREHENSIVE METABOLIC PANEL
ALBUMIN: 4.1 g/dL (ref 3.5–5.2)
ALK PHOS: 84 U/L (ref 39–117)
ALT: 11 U/L (ref 0–35)
AST: 13 U/L (ref 0–37)
BILIRUBIN TOTAL: 0.3 mg/dL (ref 0.2–1.2)
BUN: 20 mg/dL (ref 6–23)
CALCIUM: 9.9 mg/dL (ref 8.4–10.5)
CO2: 27 mEq/L (ref 19–32)
Chloride: 102 mEq/L (ref 96–112)
Creatinine, Ser: 0.74 mg/dL (ref 0.40–1.20)
GFR: 83.77 mL/min (ref >60.00)
Glucose, Bld: 131 mg/dL — ABNORMAL HIGH (ref 70–99)
POTASSIUM: 4.6 meq/L (ref 3.5–5.1)
Sodium: 137 mEq/L (ref 135–145)
TOTAL PROTEIN: 7.4 g/dL (ref 6.0–8.3)

## 2017-04-05 LAB — HEMOGLOBIN A1C: HEMOGLOBIN A1C: 7.2 % — AB (ref 4.6–6.5)

## 2017-04-05 LAB — LIPID PANEL
CHOLESTEROL: 126 mg/dL (ref 0–200)
HDL: 67 mg/dL (ref >39.00)
LDL Cholesterol: 37 mg/dL (ref 0–99)
NonHDL: 58.71
TRIGLYCERIDES: 111 mg/dL (ref 0.0–149.0)
Total CHOL/HDL Ratio: 2
VLDL: 22.2 mg/dL (ref 0.0–40.0)

## 2017-04-05 LAB — MICROALBUMIN / CREATININE URINE RATIO
CREATININE, U: 83.4 mg/dL
MICROALB/CREAT RATIO: 1.1 mg/g (ref 0.0–30.0)
Microalb, Ur: 0.9 mg/dL (ref 0.0–1.9)

## 2017-04-05 LAB — LDL CHOLESTEROL, DIRECT: LDL DIRECT: 47 mg/dL

## 2017-04-05 NOTE — Addendum Note (Signed)
Addended by: Arby Barrette on: 04/05/2017 10:21 AM   Modules accepted: Orders

## 2017-04-06 LAB — T4 AND TSH
T4 TOTAL: 6.7 ug/dL (ref 4.5–12.0)
TSH: 4.89 u[IU]/mL — AB (ref 0.450–4.500)

## 2017-04-06 NOTE — Progress Notes (Signed)
Cardiology Office Note  Date:  04/07/2017   ID:  Dana Ramirez, DOB [birth year], MRN [id number]  PCP:  Crecencio Mc, MD   Chief Complaint  Patient presents with  . other    6 month follow up. Patient states she is doing well. Meds reviewed verbally with patient.     HPI:   Dana Ramirez is a very pleasant 66 year old woman with a history of  coronary artery disease,  bypass surgery in September 2006 with a LIMA to the LAD, vein graft to the diagonal,  negative stress test in 2010,  Less than 39% bilateral carotid disease,  who presents for routine followup of her coronary artery disease  In follow-up today she is having some breakthrough heartburn symptoms Does not feel that Pepcid is working for her Husband takes omeprazole, wonders if she should switch Questions about Co Q 10 Pain in her left thumb, also sometimes left posterior neck, rarely radiating down her arm, feels like muscle   no regular exercise, cleaning out her treadmill room No chest pain concerning for angina  Lab work reviewed with her in detail, total cholesterol 120s, LDL less than 50, Hemoglobin A1c up to 7.2, previously 6.5 No exercise, weight up, eating more ice cream  EKG personally reviewed by myself on today's visit shows normal sinus rhythm with rate 75 bpm, no significant ST or T-wave changes  Other past medical history Cholesterol well-controlled for the past 6-7 years prior history of vertigo  Losartan decreased in the past. This helped her lightheaded spells  Cardiac catheterization done September 2006 showed a diffusely diseased LAD from the left main to the midportion with lesion up to 90-95%, 95% ostial diagonal disease.  PMH:   has a past medical history of CAD (coronary artery disease); Carotid arterial disease ([place]); Diabetes mellitus without complication ([place]); History of vertigo; Hyperlipidemia; Hypertension; and Kidney stone.  PSH:    Past Surgical History:  Procedure  Laterality Date  . BREAST CYST ASPIRATION Left years ago  . CESAREAN SECTION     x 2  . CHOLECYSTECTOMY    . CORONARY ARTERY BYPASS GRAFT  03/2005   LIMA to the LAD, vein graft to diagonal with a negative stress test in 06/2006  . TONSILLECTOMY      Current Outpatient Prescriptions  Medication Sig Dispense Refill  . aspirin (ASPIR-LOW) 81 MG EC tablet Take 162 mg by mouth daily.      . cyanocobalamin 1000 MCG tablet Take 1,000 mcg by mouth daily.    . famotidine (PEPCID) 20 MG tablet TAKE ONE TABLET BY MOUTH TWICE A DAY 60 tablet 5  . glipiZIDE (GLUCOTROL XL) 2.5 MG 24 hr tablet TAKE ONE TABLET BY MOUTH DAILY WITH BREAKFAST 90 tablet 1  . JANUVIA 100 MG tablet TAKE ONE TABLET BY MOUTH DAILY 90 tablet 3  . metFORMIN (GLUCOPHAGE) 1000 MG tablet TAKE ONE TABLET BY MOUTH DAILY 90 tablet 1  . metoprolol succinate (TOPROL-XL) 25 MG 24 hr tablet TAKE 1 TABLET DAILY 90 tablet 2  . rosuvastatin (CRESTOR) 20 MG tablet TAKE ONE TABLET BY MOUTH DAILY 90 tablet 2   No current facility-administered medications for this visit.      Allergies:   Codeine; Lisinopril; and Penicillins   Social History:  The patient  reports that she has never smoked. She has never used smokeless tobacco. She reports that she does not drink alcohol or use drugs.   Family History:   family history includes Breast cancer (age of  onset: 42) in her paternal grandmother; Coronary artery disease in her other; Diabetes in her brother; Heart disease in her maternal grandfather, maternal grandmother, mother, paternal grandfather, paternal grandmother, and sister; Heart disease (age of onset: 55) in her father; Stroke (age of onset: 33) in her sister.    Review of Systems: Review of Systems  Constitutional: Negative.   Respiratory: Negative.   Cardiovascular: Negative.   Gastrointestinal: Positive for heartburn.  Musculoskeletal: Positive for myalgias.  Neurological: Negative.   Psychiatric/Behavioral: Negative.   All  other systems reviewed and are negative.    PHYSICAL EXAM: VS:  BP 118/66 (BP Location: Left Arm, Patient Position: Sitting, Cuff Size: Normal)   Pulse 75   Ht 5\' 3"  (1.6 m)   Wt 154 lb (69.9 kg)   BMI 27.28 kg/m  , BMI Body mass index is 27.28 kg/m.  GEN: Well nourished, well developed, in no acute distress  HEENT: normal  Neck: no JVD, carotid bruits, or masses Cardiac: RRR; no murmurs, rubs, or gallops,no edema  Respiratory:  clear to auscultation bilaterally, normal work of breathing GI: soft, nontender, nondistended, + BS MS: no deformity or atrophy  Skin: warm and dry, no rash Neuro:  Strength and sensation are intact Psych: euthymic mood, full affect    Recent Labs: 07/03/2016: Hemoglobin 12.6; Platelets 192.0 04/05/2017: ALT 11; BUN 20; Creatinine, Ser 0.74; Potassium 4.6; Sodium 137; TSH 4.890    Lipid Panel Lab Results  Component Value Date   CHOL 126 04/05/2017   HDL 67.00 04/05/2017   LDLCALC 37 04/05/2017   TRIG 111.0 04/05/2017      Wt Readings from Last 3 Encounters:  04/07/17 154 lb (69.9 kg)  10/05/16 155 lb 8 oz (70.5 kg)  10/02/16 156 lb 9.6 oz (71 kg)       ASSESSMENT AND PLAN:   Mixed hyperlipidemia - Plan: EKG 12-Lead Cholesterol is at goal on the current lipid regimen. No changes to the medications were made.  Atherosclerosis of native coronary artery of native heart without angina pectoris - Plan: EKG 12-Lead No anginal symptoms, recommended regular exercise program Cholesterol at goal  Bilateral carotid artery stenosis - Plan: EKG 12-Lead Carotid ultrasound reviewed with her, done today less than 39% She does not need regular studies given no change  Essential hypertension - Plan: EKG 12-Lead Blood pressure is well controlled on today's visit. No changes made to the medications. stable  Diabetes mellitus without complication ([place]) - Plan: EKG 12-Lead Discussed dietary changes with her Hemoglobin A1c up to 7.2 Recommended  walking program  CABG: Surgery 12 years ago stressed importance of weight loss, walking program On low-dose aspirin, statin, beta blocker No medication changes, stable  GERD No relief on Pepcid 20 mg daily Recommended she try to change, start omeprazole 20 mg daily and Pepcid for breakthrough   Total encounter time more than 25 minutes  Greater than 50% was spent in counseling and coordination of care with the patient   Disposition:   F/U  6 months   No orders of the defined types were placed in this encounter.    Signed, Esmond Plants, M.D., Ph.D. 04/07/2017  [place], [place]

## 2017-04-07 ENCOUNTER — Encounter: Payer: Self-pay | Admitting: Cardiovascular Disease

## 2017-04-07 ENCOUNTER — Ambulatory Visit (INDEPENDENT_AMBULATORY_CARE_PROVIDER_SITE_OTHER): Payer: BC Managed Care – PPO | Admitting: Cardiovascular Disease

## 2017-04-07 ENCOUNTER — Ambulatory Visit: Payer: BC Managed Care – PPO

## 2017-04-07 VITALS — BP 118/66 | HR 75 | Ht 63.0 in | Wt 154.0 lb

## 2017-04-07 DIAGNOSIS — E782 Mixed hyperlipidemia: Secondary | ICD-10-CM

## 2017-04-07 DIAGNOSIS — Z951 Presence of aortocoronary bypass graft: Secondary | ICD-10-CM | POA: Diagnosis not present

## 2017-04-07 DIAGNOSIS — E119 Type 2 diabetes mellitus without complications: Secondary | ICD-10-CM

## 2017-04-07 DIAGNOSIS — I10 Essential (primary) hypertension: Secondary | ICD-10-CM | POA: Diagnosis not present

## 2017-04-07 DIAGNOSIS — I25118 Atherosclerotic heart disease of native coronary artery with other forms of angina pectoris: Secondary | ICD-10-CM

## 2017-04-07 DIAGNOSIS — I6523 Occlusion and stenosis of bilateral carotid arteries: Secondary | ICD-10-CM

## 2017-04-07 LAB — VAS US CAROTID
LCCADDIAS: -19 cm/s
LEFT ECA DIAS: -6 cm/s
LEFT VERTEBRAL DIAS: 12 cm/s
LICADDIAS: -36 cm/s
LICADSYS: -117 cm/s
Left CCA dist sys: -61 cm/s
Left CCA prox dias: 25 cm/s
Left CCA prox sys: 89 cm/s
Left ICA prox dias: -15 cm/s
Left ICA prox sys: -48 cm/s
RCCADSYS: -89 cm/s
RIGHT ECA DIAS: -13 cm/s
RIGHT VERTEBRAL DIAS: 10 cm/s
Right CCA prox dias: 15 cm/s
Right CCA prox sys: 79 cm/s

## 2017-04-07 NOTE — Patient Instructions (Signed)

## 2017-04-08 ENCOUNTER — Encounter: Payer: Self-pay | Admitting: Internal Medicine

## 2017-04-08 ENCOUNTER — Telehealth: Payer: Self-pay | Admitting: Internal Medicine

## 2017-04-08 ENCOUNTER — Ambulatory Visit (INDEPENDENT_AMBULATORY_CARE_PROVIDER_SITE_OTHER): Payer: BC Managed Care – PPO | Admitting: Internal Medicine

## 2017-04-08 VITALS — BP 116/60 | HR 65 | Temp 97.9°F | Resp 14 | Ht 63.0 in | Wt 155.8 lb

## 2017-04-08 DIAGNOSIS — E119 Type 2 diabetes mellitus without complications: Secondary | ICD-10-CM | POA: Diagnosis not present

## 2017-04-08 DIAGNOSIS — E538 Deficiency of other specified B group vitamins: Secondary | ICD-10-CM | POA: Diagnosis not present

## 2017-04-08 DIAGNOSIS — I10 Essential (primary) hypertension: Secondary | ICD-10-CM

## 2017-04-08 DIAGNOSIS — K2100 Gastro-esophageal reflux disease with esophagitis, without bleeding: Secondary | ICD-10-CM

## 2017-04-08 DIAGNOSIS — E039 Hypothyroidism, unspecified: Secondary | ICD-10-CM

## 2017-04-08 DIAGNOSIS — Z23 Encounter for immunization: Secondary | ICD-10-CM | POA: Diagnosis not present

## 2017-04-08 DIAGNOSIS — E038 Other specified hypothyroidism: Secondary | ICD-10-CM

## 2017-04-08 MED ORDER — METFORMIN HCL ER (MOD) 1000 MG PO TB24: 1000.0000 mg | ORAL_TABLET | Freq: Every day | ORAL | 1 refills | Status: DC

## 2017-04-08 MED ORDER — METFORMIN HCL 1000 MG PO TABS: 1000.0000 mg | ORAL_TABLET | Freq: Two times a day (BID) | ORAL | 3 refills | Status: DC

## 2017-04-08 NOTE — Telephone Encounter (Signed)
LMTCB

## 2017-04-08 NOTE — Telephone Encounter (Signed)
Spoke with pt and informed her of the medication change. Pt gave a verbal understanding.

## 2017-04-08 NOTE — Patient Instructions (Addendum)
Your thyroid test is nearly normal.  No meds needed unless you develop underactive thryoid symptoms.  We will repeat in 3 months with your A1c (no fasting required) on or after Dec 17th   We changed the metformin  to ER (extended realeaes)  , you can take in the morning along with glipizide and Januvia.   You can either take pepcid 20 mg twice daily,  tagamet  800 mg  (cimetidine) twice daily , or zantac twice daily (150 mg )  For your Reflux  If symptoms persist ,  Resume omeprazole up 20 mg twice daily or 40 mg in the morning.    Low carb ice cream  Has become more popular and there are many more choices :  Breyer's Carb Smart Line   E enlightenment  Skinny Cow  Weight watchers    I Recommend Low Carb high Protein premixed Shakes:   Premier Protein  (30 g 1  g sugar ) . Atkins Advantage Muscle Milk EAS AdvantEdge   All of these are available at [employer], [place], and [employer]  And taste good     To make a low carb chip :  Take the Joseph's Lavash or Pita bread,  Or the [place] Low carb whole wheat tortilla   Place on metal cookie sheet  Brush with olive oil  Sprinkle garlic powder (NOT garlic salt), grated parmesan cheese, mediterranean seasoning , or all of them?  Bake at 275 for 30 minutes   We have substitutions for your potatoes!!  Try the mashed cauliflower and riced cauliflower dishes instead of rice and mashed potatoes  Mashed turnips are also very low carb!   For desserts :  Try the Dannon Lt n Fit greek yogurt dessert flavors and top with reddi Whip .  8 carbs,  80 calories  Try Oikos Triple Zero [place] Yogurt in the salted caramel, and the coffee flavors  With Whipped Cream for dessert  breyer's low carb ice cream, available in bars (on a stick, better ) or scoopable ice cream  HERE ARE THE LOW CARB  BREAD CHOICES

## 2017-04-08 NOTE — Telephone Encounter (Signed)
Pharmacy called and wanted to check on pt's metFORMIN (GLUMETZA) 1000 MG (MOD) 24 hr tablet. They stated that the way it was sent over the formula was different and was more expensive and the insurance would not cover it. Please advise, thank you!

## 2017-04-08 NOTE — Telephone Encounter (Signed)
Insurance will not cover the Metformin ER.

## 2017-04-08 NOTE — Progress Notes (Signed)
Subjective:  Patient ID: Dana Ramirez, female    DOB: [birth year]  Age: 66 y.o. MRN: [id number]  CC: The primary encounter diagnosis was Subclinical hypothyroidism. Diagnoses of Need for immunization against influenza, Diabetes mellitus without complication ([place]), I62 deficiency, Essential hypertension, and Gastroesophageal reflux disease with esophagitis were also pertinent to this visit.  HPI Dana Ramirez presents for 6 month follow up on diabetes.    Patient has no complaints today.  Patient is following a low glycemic index diet most of the time but indulges in ice cream and lil debbie cookies .  and taking all prescribed medications regularly without side effects.  Fasting sugars have been under less than 140 most of the time and post prandials have been under 160 except on rare occasions. Patient is exercising about 3 times per week and intentionally trying to lose weight .  Patient has had an eye exam in the last 12 months and checks feet regularly for signs of infection.  Patient does not walk barefoot outside,  And denies an numbness tingling or burning in feet. 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 aspirin (ASPIR-LOW) 81 MG EC tablet Take 162 mg by mouth daily.      . cyanocobalamin 1000 MCG tablet Take 1,000 mcg by mouth daily.    . famotidine (PEPCID) 20 MG tablet TAKE ONE TABLET BY MOUTH TWICE A DAY 60 tablet 5  . glipiZIDE (GLUCOTROL XL) 2.5 MG 24 hr tablet TAKE ONE TABLET BY MOUTH DAILY WITH BREAKFAST 90 tablet 1  . JANUVIA 100 MG tablet TAKE ONE TABLET BY MOUTH DAILY 90 tablet 3  . metoprolol succinate (TOPROL-XL) 25 MG 24 hr tablet TAKE 1 TABLET DAILY 90 tablet 2  . rosuvastatin (CRESTOR) 20 MG tablet TAKE ONE TABLET BY MOUTH DAILY 90 tablet 2  . metFORMIN (GLUCOPHAGE) 1000 MG tablet TAKE ONE TABLET BY MOUTH  DAILY 90 tablet 1   No facility-administered medications prior to visit.     Review of Systems;  Patient denies headache, fevers, malaise, unintentional weight loss, skin rash, eye pain, sinus congestion and sinus pain, sore throat, dysphagia,  hemoptysis , cough, dyspnea, wheezing, chest pain, palpitations, orthopnea, edema, abdominal pain, nausea, melena, diarrhea, constipation, flank pain, dysuria, hematuria, urinary  Frequency, nocturia, numbness, tingling, seizures,  Focal weakness, Loss of consciousness,  Tremor, insomnia, depression, anxiety, and suicidal ideation.      Objective:  BP 116/60 (BP Location: Left Arm, Patient Position: Sitting, Cuff Size: Normal)   Pulse 65   Temp 97.9 F (36.6 C) (Oral)   Resp 14   Ht 5\' 3"  (1.6 m)   Wt 155 lb 12.8 oz (70.7 kg)   SpO2 97%   BMI 27.60 kg/m   BP Readings from Last 3 Encounters:  04/08/17 116/60  04/07/17 118/66  10/05/16 122/64    Wt Readings from Last 3 Encounters:  04/08/17 155 lb 12.8 oz (70.7 kg)  04/07/17 154 lb (69.9 kg)  10/05/16 155 lb 8 oz (70.5 kg)    General appearance: alert, cooperative and appears stated age Ears: normal TM's and external ear canals both ears Throat: lips, mucosa, and tongue normal; teeth and gums normal Neck: no adenopathy, no carotid bruit, supple, symmetrical, trachea midline and thyroid not enlarged, symmetric, no tenderness/mass/nodules Back: symmetric, no curvature. ROM normal. No CVA tenderness.  Lungs: clear to auscultation bilaterally Heart: regular rate and rhythm, S1, S2 normal, no murmur, click, rub or gallop Abdomen: soft, non-tender; bowel sounds normal; no masses,  no organomegaly Pulses: 2+ and symmetric Skin: Skin color, texture, turgor normal. No rashes or lesions Lymph nodes: Cervical, supraclavicular, and axillary nodes normal.  Lab Results  Component Value Date   HGBA1C 7.2 (H) 04/05/2017   HGBA1C 6.5 10/02/2016   HGBA1C 7.0 (H) 07/03/2016    Lab Results    Component Value Date   CREATININE 0.74 04/05/2017   CREATININE 0.69 10/01/2016   CREATININE 0.69 07/03/2016    Lab Results  Component Value Date   WBC 4.8 07/03/2016   HGB 12.6 07/03/2016   HCT 37.3 07/03/2016   PLT 192.0 07/03/2016   GLUCOSE 131 (H) 04/05/2017   CHOL 126 04/05/2017   TRIG 111.0 04/05/2017   HDL 67.00 04/05/2017   LDLDIRECT 47.0 04/05/2017   LDLCALC 37 04/05/2017   ALT 11 04/05/2017   AST 13 04/05/2017   NA 137 04/05/2017   K 4.6 04/05/2017   CL 102 04/05/2017   CREATININE 0.74 04/05/2017   BUN 20 04/05/2017   CO2 27 04/05/2017   TSH 4.890 (H) 04/05/2017   HGBA1C 7.2 (H) 04/05/2017   MICROALBUR 0.9 04/05/2017    Dg Chest 2 View  Result Date: 08/21/2016 CLINICAL DATA:  Follow-up bilateral infiltrates, persistent cough EXAM: CHEST  2 VIEW COMPARISON:  06/26/2016, 12/29/2014 FINDINGS: Cardiac shadow is within normal limits. Postsurgical changes are noted. Persistent changes in the right lung base are noted. These were previously noted on prior CT from 2016 and likely represent scarring. No acute infiltrate or sizable effusion is noted. Left lung base is clear. No bony abnormality is seen. IMPRESSION: Interval clearing of left basilar changes. Stable changes in the right base are noted dating back to 2016 consistent with scarring. Electronically Signed   By: Inez Catalina M.D.   On: 08/21/2016 11:22   Mm Screening Breast Tomo Bilateral  Result Date: 08/21/2016 CLINICAL DATA:  Screening. EXAM: 2D DIGITAL SCREENING BILATERAL MAMMOGRAM WITH CAD AND ADJUNCT TOMO COMPARISON:  Previous exam(s). ACR Breast Density Category b: There are scattered areas of fibroglandular density. FINDINGS: There are no findings suspicious for malignancy. Images were processed with CAD. IMPRESSION: No mammographic evidence of malignancy. A result letter of this screening mammogram will be mailed directly to the patient. RECOMMENDATION: Screening mammogram in one year. (Code:[id number]-B-01Y) BI-RADS  CATEGORY  1: Negative. Electronically Signed   By: Fidela Salisbury M.D.   On: 08/21/2016 16:34    Assessment & Plan:   Problem List Items Addressed This Visit    B12 deficiency   Relevant Orders   B12   Diabetes mellitus without complication ([place])    Managed with metformin, glipizide and Januvia.  She has minimal microalbuminuria, but has no room for addition of an ARB given her intolerance to ace inhibitor and bp of 110/60. Marland Kitchen  She is tolerating daily use of a baby aspirin and a statin .   Lab Results  Component Value Date   HGBA1C 7.2 (H) 04/05/2017   Lab Results  Component Value Date   MICROALBUR 0.9 04/05/2017         Relevant Orders   Hemoglobin A1c   GERD (gastroesophageal reflux disease)    Discussed current controversy regarding prolonged use of PPI in patients without documented Barretts esophagus.  Patient has no prior EGD but has been on PPI therapy for > 5 years (per patient).  Suggested trial of pepcid 20 mg daily.  If GERD symptoms return,  advised her to accept referral for EGD.       Relevant Medications   Probiotic Product (PROBIOTIC-10 PO)   Hypertension    Well controlled on current regimen. Renal function stable, no changes today.  Lab Results  Component Value Date   CREATININE 0.74 04/05/2017   Lab Results  Component Value Date   NA 137 04/05/2017   K 4.6 04/05/2017   CL 102 04/05/2017   CO2 27 04/05/2017         Subclinical hypothyroidism - Primary    She remains  asymptomatic . Will repeat  Every 6 months   Lab Results  Component Value Date   TSH 4.890 (H) 04/05/2017         Relevant Orders   TSH    Other Visit Diagnoses    Need for immunization against influenza       Relevant Orders   Flu Vaccine QUAD 36+ mos IM (Completed)    A total of 25 minutes of face to face time was spent with patient more than half of which was spent in counselling and coordination of care   I have discontinued Ms. Kasprzak metFORMIN. I am also  having her maintain her aspirin, glipiZIDE, metoprolol succinate, rosuvastatin, famotidine, JANUVIA, cyanocobalamin, and Probiotic Product (PROBIOTIC-10 PO).  Meds ordered this encounter  Medications  . Probiotic Product (PROBIOTIC-10 PO)    Sig: Take 1 capsule by mouth daily.  Marland Kitchen DISCONTD: metFORMIN (GLUMETZA) 1000 MG (MOD) 24 hr tablet    Sig: Take 1 tablet (1,000 mg total) by mouth daily with breakfast.    Dispense:  90 tablet    Refill:  1    Medications Discontinued During This Encounter  Medication Reason  . metFORMIN (GLUCOPHAGE) 1000 MG tablet     Follow-up: Return for follow up diabetes.   Crecencio Mc, MD

## 2017-04-08 NOTE — Telephone Encounter (Signed)
Going back to short acting metformin Dose increased to twice daily if she can tolerate it.  rx sent

## 2017-04-09 ENCOUNTER — Encounter: Payer: Self-pay | Admitting: Internal Medicine

## 2017-04-10 DIAGNOSIS — K219 Gastro-esophageal reflux disease without esophagitis: Secondary | ICD-10-CM | POA: Insufficient documentation

## 2017-04-10 NOTE — Assessment & Plan Note (Signed)
Discussed current controversy regarding prolonged use of PPI in patients without documented Barretts esophagus.  Patient has no prior EGD but has been on PPI therapy for > 5 years (per patient).  Suggested trial of pepcid 20 mg daily.  If GERD symptoms return,  advised her to accept referral for EGD.

## 2017-04-10 NOTE — Assessment & Plan Note (Signed)
She remains  asymptomatic . Will repeat  Every 6 months   Lab Results  Component Value Date   TSH 4.890 (H) 04/05/2017

## 2017-04-10 NOTE — Assessment & Plan Note (Signed)
Well controlled on current regimen. Renal function stable, no changes today.  Lab Results  Component Value Date   CREATININE 0.74 04/05/2017   Lab Results  Component Value Date   NA 137 04/05/2017   K 4.6 04/05/2017   CL 102 04/05/2017   CO2 27 04/05/2017

## 2017-04-10 NOTE — Assessment & Plan Note (Signed)
Managed with metformin, glipizide and Januvia.  She has minimal microalbuminuria, but has no room for addition of an ARB given her intolerance to ace inhibitor and bp of 110/60. Dana Ramirez  She is tolerating daily use of a baby aspirin and a statin .   Lab Results  Component Value Date   HGBA1C 7.2 (H) 04/05/2017   Lab Results  Component Value Date   MICROALBUR 0.9 04/05/2017

## 2017-04-12 ENCOUNTER — Other Ambulatory Visit: Payer: BC Managed Care – PPO

## 2017-04-29 ENCOUNTER — Other Ambulatory Visit: Payer: Self-pay | Admitting: Internal Medicine

## 2017-04-29 DIAGNOSIS — E119 Type 2 diabetes mellitus without complications: Secondary | ICD-10-CM

## 2017-05-17 ENCOUNTER — Other Ambulatory Visit: Payer: Self-pay | Admitting: Internal Medicine

## 2017-05-17 DIAGNOSIS — E119 Type 2 diabetes mellitus without complications: Secondary | ICD-10-CM

## 2017-05-20 ENCOUNTER — Other Ambulatory Visit: Payer: Self-pay | Admitting: Internal Medicine

## 2017-05-27 LAB — HM DIABETES EYE EXAM

## 2017-07-05 ENCOUNTER — Other Ambulatory Visit (INDEPENDENT_AMBULATORY_CARE_PROVIDER_SITE_OTHER): Payer: Medicare Other

## 2017-07-05 DIAGNOSIS — E119 Type 2 diabetes mellitus without complications: Secondary | ICD-10-CM | POA: Diagnosis not present

## 2017-07-05 DIAGNOSIS — E038 Other specified hypothyroidism: Secondary | ICD-10-CM

## 2017-07-05 DIAGNOSIS — E538 Deficiency of other specified B group vitamins: Secondary | ICD-10-CM | POA: Diagnosis not present

## 2017-07-05 DIAGNOSIS — E039 Hypothyroidism, unspecified: Secondary | ICD-10-CM

## 2017-07-05 LAB — VITAMIN B12: VITAMIN B 12: 1215 pg/mL — AB (ref 211–911)

## 2017-07-05 LAB — HEMOGLOBIN A1C: HEMOGLOBIN A1C: 7.1 % — AB (ref 4.6–6.5)

## 2017-07-05 LAB — TSH: TSH: 4.59 u[IU]/mL — AB (ref 0.35–4.50)

## 2017-07-08 ENCOUNTER — Other Ambulatory Visit: Payer: Self-pay | Admitting: Internal Medicine

## 2017-07-08 ENCOUNTER — Encounter: Payer: Self-pay | Admitting: Internal Medicine

## 2017-07-08 NOTE — Progress Notes (Unsigned)
Lab Results  Component Value Date   CHOL 126 04/05/2017   HDL 67.00 04/05/2017   LDLCALC 37 04/05/2017   LDLDIRECT 47.0 04/05/2017   TRIG 111.0 04/05/2017   CHOLHDL 2 04/05/2017

## 2017-07-26 ENCOUNTER — Encounter: Payer: Self-pay | Admitting: Internal Medicine

## 2017-07-26 ENCOUNTER — Ambulatory Visit: Payer: Medicare Other | Admitting: Internal Medicine

## 2017-07-26 VITALS — BP 130/72 | HR 69 | Temp 97.7°F | Resp 14 | Ht 63.0 in | Wt 156.0 lb

## 2017-07-26 DIAGNOSIS — I10 Essential (primary) hypertension: Secondary | ICD-10-CM | POA: Diagnosis not present

## 2017-07-26 DIAGNOSIS — E782 Mixed hyperlipidemia: Secondary | ICD-10-CM | POA: Diagnosis not present

## 2017-07-26 DIAGNOSIS — Z23 Encounter for immunization: Secondary | ICD-10-CM | POA: Diagnosis not present

## 2017-07-26 DIAGNOSIS — E119 Type 2 diabetes mellitus without complications: Secondary | ICD-10-CM

## 2017-07-26 DIAGNOSIS — E039 Hypothyroidism, unspecified: Secondary | ICD-10-CM | POA: Diagnosis not present

## 2017-07-26 DIAGNOSIS — E038 Other specified hypothyroidism: Secondary | ICD-10-CM

## 2017-07-26 MED ORDER — ZOSTER VAC RECOMB ADJUVANTED 50 MCG/0.5ML IM SUSR: 0.5000 mL | Freq: Once | INTRAMUSCULAR | 1 refills | Status: AC

## 2017-07-26 MED ORDER — LEVOTHYROXINE SODIUM 25 MCG PO CAPS: 25.0000 ug | ORAL_CAPSULE | Freq: Every day | ORAL | 1 refills | Status: DC

## 2017-07-26 MED ORDER — TETANUS-DIPHTH-ACELL PERTUSSIS 5-2.5-18.5 LF-MCG/0.5 IM SUSP: 0.5000 mL | Freq: Once | INTRAMUSCULAR | 0 refills | Status: AC

## 2017-07-26 NOTE — Patient Instructions (Signed)
Your thyroid  function is underactive.  I have sent a low dose of levothyroxine to your pharmacy.  Please start taking the medication in the morning  and plan to repeat the thyroid function test in  6 weeks (sooner if you start feeling excessively tired or jittery)    The TDap is $110 out of pocket since medicare will not pay for it,    Most pharmacies will offer it as well for far less  ([employer] [facility] offers it for $45)   The ShingRx vaccine is now available in local pharmacies and is much more protective thant Zostavaxs,  It is therefore ADVISED for all interested adults over 50 to prevent shingles

## 2017-07-26 NOTE — Assessment & Plan Note (Signed)
TSH has been elevATED FOR YEARS.  TRIAL OF LEVOTHYROZINE 25 MCG  RTC 6 WEEKS

## 2017-07-26 NOTE — Progress Notes (Signed)
Subjective:  Patient ID: Dana Ramirez, female    DOB: [birth year]  Age: 67 y.o. MRN: [id number]  CC: The primary encounter diagnosis was Diabetes mellitus without complication ([place]). Diagnoses of Subclinical hypothyroidism, Mixed hyperlipidemia, Essential hypertension, and Need for vaccination with 13-polyvalent pneumococcal conjugate vaccine were also pertinent to this visit.  HPI Dana Ramirez presents for 3 month follow up on diabetes.  Patient has no complaints today.  Patient is following a low glycemic index diet and taking all prescribed medications regularly without side effects.  Fasting sugars have been under less than 140 most of the time and post prandials have been under 160 except on rare occasions. Patient is exercising about 3 times per week and intentionally trying to lose weight .  Patient has had an eye exam in the last 12 months and checks feet regularly for signs of infection.  Patient does not walk barefoot outside,  And denies an numbness tingling or burning in feet. Patient is up to date on all recommended vaccinations   Multiple sick contacts:  Some PND,  Cough without sputum.  Felt sick last Tuesday,  Feeling fine now.      Outpatient Medications Prior to Visit  Medication Sig Dispense Refill  . aspirin (ASPIR-LOW) 81 MG EC tablet Take 162 mg by mouth daily.      . Cholecalciferol (VITAMIN D3) 1000 units CAPS Take 1 capsule by mouth daily.    . cyanocobalamin 1000 MCG tablet Take 1,000 mcg by mouth daily.    Marland Kitchen JANUVIA 100 MG tablet TAKE ONE TABLET BY MOUTH DAILY 90 tablet 2  . metFORMIN (GLUCOPHAGE) 1000 MG tablet TAKE ONE TABLET BY MOUTH DAILY 90 tablet 1  . metoprolol succinate (TOPROL-XL) 25 MG 24 hr tablet TAKE 1 TABLET DAILY 90 tablet 2  . omeprazole (PRILOSEC OTC) 20 MG tablet Take 20 mg by mouth daily.    . rosuvastatin (CRESTOR) 20 MG tablet TAKE ONE TABLET BY MOUTH DAILY 90 tablet 2  . glipiZIDE (GLUCOTROL XL) 2.5 MG 24 hr tablet TAKE ONE  TABLET BY MOUTH DAILY WITH BREAKFAST 90 tablet 0  . famotidine (PEPCID) 20 MG tablet TAKE ONE TABLET BY MOUTH TWICE A DAY (Patient not taking: Reported on 07/26/2017) 60 tablet 5  . Probiotic Product (PROBIOTIC-10 PO) Take 1 capsule by mouth daily.     No facility-administered medications prior to visit.     Review of Systems;  Patient denies headache, fevers, malaise, unintentional weight loss, skin rash, eye pain, sinus congestion and sinus pain, sore throat, dysphagia,  hemoptysis , cough, dyspnea, wheezing, chest pain, palpitations, orthopnea, edema, abdominal pain, nausea, melena, diarrhea, constipation, flank pain, dysuria, hematuria, urinary  Frequency, nocturia, numbness, tingling, seizures,  Focal weakness, Loss of consciousness,  Tremor, insomnia, depression, anxiety, and suicidal ideation.      Objective:  BP 130/72 (BP Location: Left Arm, Patient Position: Sitting, Cuff Size: Normal)   Pulse 69   Temp 97.7 F (36.5 C) (Oral)   Resp 14   Ht 5\' 3"  (1.6 m)   Wt 156 lb (70.8 kg)   SpO2 98%   BMI 27.63 kg/m   BP Readings from Last 3 Encounters:  07/26/17 130/72  04/08/17 116/60  04/07/17 118/66    Wt Readings from Last 3 Encounters:  07/26/17 156 lb (70.8 kg)  04/08/17 155 lb 12.8 oz (70.7 kg)  04/07/17 154 lb (69.9 kg)    General appearance: alert, cooperative and appears stated age Ears: normal TM's and external ear  canals both ears Throat: lips, mucosa, and tongue normal; teeth and gums normal Neck: no adenopathy, no carotid bruit, supple, symmetrical, trachea midline and thyroid not enlarged, symmetric, no tenderness/mass/nodules Back: symmetric, no curvature. ROM normal. No CVA tenderness. Lungs: clear to auscultation bilaterally Heart: regular rate and rhythm, S1, S2 normal, no murmur, click, rub or gallop Abdomen: soft, non-tender; bowel sounds normal; no masses,  no organomegaly Pulses: 2+ and symmetric Skin: Skin color, texture, turgor normal. No rashes or  lesions Lymph nodes: Cervical, supraclavicular, and axillary nodes normal.  Lab Results  Component Value Date   HGBA1C 7.1 (H) 07/05/2017   HGBA1C 7.2 (H) 04/05/2017   HGBA1C 6.5 10/02/2016    Lab Results  Component Value Date   CREATININE 0.74 04/05/2017   CREATININE 0.69 10/01/2016   CREATININE 0.69 07/03/2016    Lab Results  Component Value Date   WBC 4.8 07/03/2016   HGB 12.6 07/03/2016   HCT 37.3 07/03/2016   PLT 192.0 07/03/2016   GLUCOSE 131 (H) 04/05/2017   CHOL 126 04/05/2017   TRIG 111.0 04/05/2017   HDL 67.00 04/05/2017   LDLDIRECT 47.0 04/05/2017   LDLCALC 37 04/05/2017   ALT 11 04/05/2017   AST 13 04/05/2017   NA 137 04/05/2017   K 4.6 04/05/2017   CL 102 04/05/2017   CREATININE 0.74 04/05/2017   BUN 20 04/05/2017   CO2 27 04/05/2017   TSH 4.59 (H) 07/05/2017   HGBA1C 7.1 (H) 07/05/2017   MICROALBUR 0.9 04/05/2017    Dg Chest 2 View  Result Date: 08/21/2016 CLINICAL DATA:  Follow-up bilateral infiltrates, persistent cough EXAM: CHEST  2 VIEW COMPARISON:  06/26/2016, 12/29/2014 FINDINGS: Cardiac shadow is within normal limits. Postsurgical changes are noted. Persistent changes in the right lung base are noted. These were previously noted on prior CT from 2016 and likely represent scarring. No acute infiltrate or sizable effusion is noted. Left lung base is clear. No bony abnormality is seen. IMPRESSION: Interval clearing of left basilar changes. Stable changes in the right base are noted dating back to 2016 consistent with scarring. Electronically Signed   By: Inez Catalina M.D.   On: 08/21/2016 11:22   Mm Screening Breast Tomo Bilateral  Result Date: 08/21/2016 CLINICAL DATA:  Screening. EXAM: 2D DIGITAL SCREENING BILATERAL MAMMOGRAM WITH CAD AND ADJUNCT TOMO COMPARISON:  Previous exam(s). ACR Breast Density Category b: There are scattered areas of fibroglandular density. FINDINGS: There are no findings suspicious for malignancy. Images were processed with  CAD. IMPRESSION: No mammographic evidence of malignancy. A result letter of this screening mammogram will be mailed directly to the patient. RECOMMENDATION: Screening mammogram in one year. (Code:SM-B-01Y) BI-RADS CATEGORY  1: Negative. Electronically Signed   By: Fidela Salisbury M.D.   On: 08/21/2016 16:34    Assessment & Plan:   Problem List Items Addressed This Visit    Diabetes mellitus without complication ([place]) - Primary    Currently well-controlled on current medications .  hemoglobin A1c is at goal . Patient is reminded to schedule an annual eye exam and foot exam is normal today. Patient has no microalbuminuria. Patient is tolerating statin therapy for CAD risk reduction and  NORMOTENSIVE ON METOPROLOL   Lab Results  Component Value Date   HGBA1C 7.1 (H) 07/05/2017   Lab Results  Component Value Date   MICROALBUR 0.9 04/05/2017         Relevant Orders   Comprehensive metabolic panel   Hyperlipidemia    .LDL and triglycerides are at  goal on current dose of rosuvastatin .  She has no side effects and liver enzymes are normal. No changes today  Lab Results  Component Value Date   CHOL 126 04/05/2017   HDL 67.00 04/05/2017   LDLCALC 37 04/05/2017   LDLDIRECT 47.0 04/05/2017   TRIG 111.0 04/05/2017   CHOLHDL 2 04/05/2017   Lab Results  Component Value Date   ALT 11 04/05/2017   AST 13 04/05/2017   ALKPHOS 84 04/05/2017   BILITOT 0.3 04/05/2017          Hypertension    he reports compliance with medication regimen  but has an elevated reading today in office.  sHe has been asked to check his BP at work and  submit readings for evaluation.       Subclinical hypothyroidism    TSH has been elevATED FOR YEARS.  TRIAL OF LEVOTHYROZINE 25 MCG  RTC 6 WEEKS       Relevant Medications   Levothyroxine Sodium 25 MCG CAPS   Other Relevant Orders   TSH    Other Visit Diagnoses    Need for vaccination with 13-polyvalent pneumococcal conjugate vaccine        Relevant Orders   Pneumococcal conjugate vaccine 13-valent IM (Completed)    A total of 25 minutes of face to face time was spent with patient more than half of which was spent in counselling on the above mentioned issues.   I have discontinued Dana Ramirez "Cathy"'s famotidine and Probiotic Product (PROBIOTIC-10 PO). I am also having her start on Levothyroxine Sodium, Zoster Vaccine Adjuvanted, and Tdap. Additionally, I am having her maintain her aspirin, metoprolol succinate, rosuvastatin, cyanocobalamin, JANUVIA, metFORMIN, omeprazole, and Vitamin D3.  Meds ordered this encounter  Medications  . Levothyroxine Sodium 25 MCG CAPS    Sig: Take 1 capsule (25 mcg total) by mouth daily before breakfast.    Dispense:  90 capsule    Refill:  1  . Zoster Vaccine Adjuvanted [facility]) injection    Sig: Inject 0.5 mLs into the muscle once for 1 dose.    Dispense:  1 each    Refill:  1  . Tdap (BOOSTRIX) 5-2.5-18.5 LF-MCG/0.5 injection    Sig: Inject 0.5 mLs into the muscle once for 1 dose.    Dispense:  0.5 mL    Refill:  0    Medications Discontinued During This Encounter  Medication Reason  . famotidine (PEPCID) 20 MG tablet Patient has not taken in last 30 days  . Probiotic Product (PROBIOTIC-10 PO) Patient has not taken in last 30 days    Follow-up: Return for 6 week lab appt  FU  6 month , follow up diabetes.   Crecencio Mc, MD

## 2017-07-26 NOTE — Assessment & Plan Note (Signed)
Currently well-controlled on current medications .  hemoglobin A1c is at goal . Patient is reminded to schedule an annual eye exam and foot exam is normal today. Patient has no microalbuminuria. Patient is tolerating statin therapy for CAD risk reduction and  NORMOTENSIVE ON METOPROLOL   Lab Results  Component Value Date   HGBA1C 7.1 (H) 07/05/2017   Lab Results  Component Value Date   MICROALBUR 0.9 04/05/2017

## 2017-07-26 NOTE — Assessment & Plan Note (Signed)
.  LDL and triglycerides are at goal on current dose of rosuvastatin .  She has no side effects and liver enzymes are normal. No changes today  Lab Results  Component Value Date   CHOL 126 04/05/2017   HDL 67.00 04/05/2017   LDLCALC 37 04/05/2017   LDLDIRECT 47.0 04/05/2017   TRIG 111.0 04/05/2017   CHOLHDL 2 04/05/2017   Lab Results  Component Value Date   ALT 11 04/05/2017   AST 13 04/05/2017   ALKPHOS 84 04/05/2017   BILITOT 0.3 04/05/2017

## 2017-07-26 NOTE — Assessment & Plan Note (Signed)
he reports compliance with medication regimen  but has an elevated reading today in office.  sHe has been asked to check his BP at work and  submit readings for evaluation.

## 2017-07-28 ENCOUNTER — Other Ambulatory Visit: Payer: Self-pay | Admitting: Internal Medicine

## 2017-07-28 DIAGNOSIS — E119 Type 2 diabetes mellitus without complications: Secondary | ICD-10-CM

## 2017-08-13 ENCOUNTER — Other Ambulatory Visit: Payer: Self-pay | Admitting: Internal Medicine

## 2017-08-13 DIAGNOSIS — E119 Type 2 diabetes mellitus without complications: Secondary | ICD-10-CM

## 2017-09-03 ENCOUNTER — Other Ambulatory Visit: Payer: Self-pay | Admitting: Cardiovascular Disease

## 2017-09-06 ENCOUNTER — Other Ambulatory Visit (INDEPENDENT_AMBULATORY_CARE_PROVIDER_SITE_OTHER): Payer: Medicare Other

## 2017-09-06 DIAGNOSIS — E119 Type 2 diabetes mellitus without complications: Secondary | ICD-10-CM | POA: Diagnosis not present

## 2017-09-06 DIAGNOSIS — E039 Hypothyroidism, unspecified: Secondary | ICD-10-CM

## 2017-09-06 DIAGNOSIS — E038 Other specified hypothyroidism: Secondary | ICD-10-CM

## 2017-09-06 LAB — COMPREHENSIVE METABOLIC PANEL
ALBUMIN: 4.3 g/dL (ref 3.5–5.2)
ALT: 11 U/L (ref 0–35)
AST: 15 U/L (ref 0–37)
Alkaline Phosphatase: 75 U/L (ref 39–117)
BUN: 21 mg/dL (ref 6–23)
CALCIUM: 9.3 mg/dL (ref 8.4–10.5)
CHLORIDE: 104 meq/L (ref 96–112)
CO2: 28 mEq/L (ref 19–32)
Creatinine, Ser: 0.73 mg/dL (ref 0.40–1.20)
GFR: 84.98 mL/min (ref >60.00)
Glucose, Bld: 125 mg/dL — ABNORMAL HIGH (ref 70–99)
POTASSIUM: 4.4 meq/L (ref 3.5–5.1)
Sodium: 139 mEq/L (ref 135–145)
Total Bilirubin: 0.4 mg/dL (ref 0.2–1.2)
Total Protein: 7.4 g/dL (ref 6.0–8.3)

## 2017-09-06 LAB — TSH: TSH: 3.09 u[IU]/mL (ref 0.35–4.50)

## 2017-09-29 ENCOUNTER — Other Ambulatory Visit: Payer: Self-pay | Admitting: Internal Medicine

## 2017-09-29 DIAGNOSIS — Z1239 Encounter for other screening for malignant neoplasm of breast: Secondary | ICD-10-CM

## 2017-10-25 ENCOUNTER — Ambulatory Visit
Admission: RE | Admit: 2017-10-25 | Discharge: 2017-10-25 | Disposition: A | Discharge status: Home / Self Care | Payer: Medicare Other | Source: Ambulatory Visit | Attending: Internal Medicine | Admitting: Internal Medicine

## 2017-10-25 DIAGNOSIS — Z1231 Encounter for screening mammogram for malignant neoplasm of breast: Secondary | ICD-10-CM | POA: Insufficient documentation

## 2017-10-25 DIAGNOSIS — Z1239 Encounter for other screening for malignant neoplasm of breast: Secondary | ICD-10-CM

## 2017-11-04 ENCOUNTER — Ambulatory Visit (INDEPENDENT_AMBULATORY_CARE_PROVIDER_SITE_OTHER): Payer: Medicare Other

## 2017-11-04 ENCOUNTER — Ambulatory Visit: Payer: Medicare Other | Admitting: Internal Medicine

## 2017-11-04 ENCOUNTER — Encounter: Payer: Self-pay | Admitting: Internal Medicine

## 2017-11-04 VITALS — BP 110/80 | HR 70 | Temp 97.7°F | Resp 15 | Ht 63.0 in | Wt 154.8 lb

## 2017-11-04 DIAGNOSIS — E039 Hypothyroidism, unspecified: Secondary | ICD-10-CM | POA: Diagnosis not present

## 2017-11-04 DIAGNOSIS — M5412 Radiculopathy, cervical region: Secondary | ICD-10-CM

## 2017-11-04 DIAGNOSIS — E119 Type 2 diabetes mellitus without complications: Secondary | ICD-10-CM

## 2017-11-04 DIAGNOSIS — R05 Cough: Secondary | ICD-10-CM

## 2017-11-04 DIAGNOSIS — R059 Cough, unspecified: Secondary | ICD-10-CM

## 2017-11-04 DIAGNOSIS — E038 Other specified hypothyroidism: Secondary | ICD-10-CM

## 2017-11-04 LAB — COMPREHENSIVE METABOLIC PANEL
ALT: 12 U/L (ref 0–35)
AST: 15 U/L (ref 0–37)
Albumin: 4.1 g/dL (ref 3.5–5.2)
Alkaline Phosphatase: 82 U/L (ref 39–117)
BILIRUBIN TOTAL: 0.2 mg/dL (ref 0.2–1.2)
BUN: 20 mg/dL (ref 6–23)
CO2: 26 meq/L (ref 19–32)
CREATININE: 0.7 mg/dL (ref 0.40–1.20)
Calcium: 9.6 mg/dL (ref 8.4–10.5)
Chloride: 103 mEq/L (ref 96–112)
GFR: 89.15 mL/min (ref >60.00)
GLUCOSE: 210 mg/dL — AB (ref 70–99)
Potassium: 4.2 mEq/L (ref 3.5–5.1)
SODIUM: 137 meq/L (ref 135–145)
Total Protein: 7.3 g/dL (ref 6.0–8.3)

## 2017-11-04 LAB — TSH: TSH: 2.35 u[IU]/mL (ref 0.35–4.50)

## 2017-11-04 LAB — T4, FREE: FREE T4: 0.94 ng/dL (ref 0.60–1.60)

## 2017-11-04 LAB — HEMOGLOBIN A1C: Hgb A1c MFr Bld: 7.3 % — ABNORMAL HIGH (ref 4.6–6.5)

## 2017-11-04 MED ORDER — GABAPENTIN 100 MG PO CAPS: 100.0000 mg | ORAL_CAPSULE | Freq: Three times a day (TID) | ORAL | 3 refills | Status: DC

## 2017-11-04 MED ORDER — CYCLOBENZAPRINE HCL 10 MG PO TABS: 10.0000 mg | ORAL_TABLET | Freq: Three times a day (TID) | ORAL | 0 refills | Status: AC | PRN

## 2017-11-04 MED ORDER — PANTOPRAZOLE SODIUM 40 MG PO TBEC: 40.0000 mg | DELAYED_RELEASE_TABLET | Freq: Every day | ORAL | 3 refills | Status: DC

## 2017-11-04 NOTE — Patient Instructions (Addendum)
For the cough:   You should try [employer]'s Sinus rinse ;  It is a stong sinus "flush" using water and medicated salts.  Do it over the sink because it can be a bit messy.  If this helps  Remove the mucus at the back of the throat, this may resolve your cough  I also recommend a trial of a proton pump inhibitor  To treat reflux  , take this once daily in the morning 20 mintues prior to eating   For the shoulder pain  :flexeril and gabapentin at bedtime only.  May increase the gabapentin dose gradually to 300 mg  Ok to combine with tylenol  if x rays suggest cervical disk disease,  MRI of cervical spine wlil be ordered

## 2017-11-04 NOTE — Progress Notes (Signed)
Subjective:  Patient ID: Dana Ramirez, female    DOB: 12/22/1950  Age: 67 y.o. MRN: [id number]  CC: The primary encounter diagnosis was Cough in adult patient. Diagnoses of Subclinical hypothyroidism, Diabetes mellitus without complication ([place]), and Cervical radiculopathy at C8 were also pertinent to this visit.  HPI Dana Ramirez presents for follow up on multiple issues acute and chronic  1) cough x 1 months,  Started after a mild URI 5 weeks ago.   Feels like it originates in the back of  her throat .  No regurgitation of food or pills   No coughing after drinking or eating   No food getting stuck,  Feeling of globus, relieved transiently by cough   Wakes her up at night  Using chest and cough by coridicin .  Occasional rhinitis, clear,  No sneeaing no antihistamine use. Marland Kitchen    2)Thinks the thyroid medication has been causing symptoms of headaches and aching in the body ,  Started the medication 3 months ago  3) Has noticed dyspnea with exertion occurs if she walks fast.  Denies jaw pain,  diaphoresis or chest discomfort.  Had some wheezing during the URI but not since .     4) right sided arm pain.  Originally started in the right lateral neck and radiated to right ear, several months ago.   Now starts in the morning in the trapezius muscle and behind the scapula ad radiates to medal side of elbow .  Keeping her up at night.   History of carpal tunnel syndrome years ago treated conservatively with wrist splint. Say that this is a different pain  Does not involve hand    Lab Results  Component Value Date   HGBA1C 7.3 (H) 11/04/2017   Lab Results  Component Value Date   MICROALBUR 0.9 04/05/2017    Outpatient Medications Prior to Visit  Medication Sig Dispense Refill  . aspirin (ASPIR-LOW) 81 MG EC tablet Take 162 mg by mouth daily.      . Cholecalciferol (VITAMIN D3) 1000 units CAPS Take 1 capsule by mouth daily.    . cyanocobalamin 1000 MCG tablet Take 1,000 mcg by  mouth daily.    Marland Kitchen glipiZIDE (GLUCOTROL XL) 2.5 MG 24 hr tablet TAKE ONE TABLET BY MOUTH EVERY MORNING WITH BREAKFAST 90 tablet 1  . JANUVIA 100 MG tablet TAKE ONE TABLET BY MOUTH DAILY 90 tablet 1  . Levothyroxine Sodium 25 MCG CAPS Take 1 capsule (25 mcg total) by mouth daily before breakfast. 90 capsule 1  . metFORMIN (GLUCOPHAGE) 1000 MG tablet TAKE ONE TABLET BY MOUTH DAILY 90 tablet 1  . metoprolol succinate (TOPROL-XL) 25 MG 24 hr tablet TAKE 1 TABLET DAILY 90 tablet 3  . omeprazole (PRILOSEC OTC) 20 MG tablet Take 20 mg by mouth daily.    . rosuvastatin (CRESTOR) 20 MG tablet TAKE ONE TABLET BY MOUTH DAILY 90 tablet 2   No facility-administered medications prior to visit.     Review of Systems;  Patient denies fevers, malaise, unintentional weight loss, skin rash, eye pain, sinus congestion and sinus pain, sore throat, dysphagia,  Hemoptysis, wheezing, chest pain, palpitations, orthopnea, edema, abdominal pain, nausea, melena, diarrhea, constipation, flank pain, dysuria, hematuria, urinary  Frequency, nocturia,  seizures,  Focal weakness, Loss of consciousness,  Tremor, insomnia, depression, anxiety, and suicidal ideation.      Objective:  BP 110/80 (BP Location: Left Arm, Patient Position: Sitting, Cuff Size: Normal)   Pulse 70   Temp  97.7 F (36.5 C) (Oral)   Resp 15   Ht _0  (1.6 m)   Wt 154 lb 12.8 oz (70.2 kg)   SpO2 95%   BMI 27.42 kg/m   BP Readings from Last 3 Encounters:  11/04/17 110/80  07/26/17 130/72  04/08/17 116/60    Wt Readings from Last 3 Encounters:  11/04/17 154 lb 12.8 oz (70.2 kg)  07/26/17 156 lb (70.8 kg)  04/08/17 155 lb 12.8 oz (70.7 kg)    General appearance: alert, cooperative and appears stated age Ears: normal TM's and external ear canals both ears Throat: lips, mucosa, and tongue normal; teeth and gums normal Neck: no adenopathy, no carotid bruit, supple, symmetrical, trachea midline and thyroid not enlarged, symmetric, no  tenderness/mass/nodules Back: symmetric, no curvature. ROM normal. No CVA tenderness. Lungs: clear to auscultation bilaterally Heart: regular rate and rhythm, S1, S2 normal, no murmur, click, rub or gallop Abdomen: soft, non-tender; bowel sounds normal; no masses,  no organomegaly Pulses: 2+ and symmetric Skin: Skin color, texture, turgor normal. No rashes or lesions Lymph nodes: Cervical, supraclavicular, and axillary nodes normal.  Lab Results  Component Value Date   HGBA1C 7.3 (H) 11/04/2017   HGBA1C 7.1 (H) 07/05/2017   HGBA1C 7.2 (H) 04/05/2017    Lab Results  Component Value Date   CREATININE 0.70 11/04/2017   CREATININE 0.73 09/06/2017   CREATININE 0.74 04/05/2017    Lab Results  Component Value Date   WBC 4.8 07/03/2016   HGB 12.6 07/03/2016   HCT 37.3 07/03/2016   PLT 192.0 07/03/2016   GLUCOSE 210 (H) 11/04/2017   CHOL 126 04/05/2017   TRIG 111.0 04/05/2017   HDL 67.00 04/05/2017   LDLDIRECT 47.0 04/05/2017   LDLCALC 37 04/05/2017   ALT 12 11/04/2017   AST 15 11/04/2017   NA 137 11/04/2017   K 4.2 11/04/2017   CL 103 11/04/2017   CREATININE 0.70 11/04/2017   BUN 20 11/04/2017   CO2 26 11/04/2017   TSH 2.35 11/04/2017   HGBA1C 7.3 (H) 11/04/2017   MICROALBUR 0.9 04/05/2017    Mm Screening Breast Tomo Bilateral  Result Date: 10/26/2017 CLINICAL DATA:  Screening. EXAM: DIGITAL SCREENING BILATERAL MAMMOGRAM WITH TOMO AND CAD COMPARISON:  Previous exam(s). ACR Breast Density Category b: There are scattered areas of fibroglandular density. FINDINGS: There are no findings suspicious for malignancy. Images were processed with CAD. IMPRESSION: No mammographic evidence of malignancy. A result letter of this screening mammogram will be mailed directly to the patient. RECOMMENDATION: Screening mammogram in one year. (Code:SM-B-01Y) BI-RADS CATEGORY  1: Negative. Electronically Signed   By: Fidela Salisbury M.D.   On: 10/26/2017 09:05    Assessment & Plan:    Problem List Items Addressed This Visit    Diabetes mellitus without complication ([place])   Relevant Orders   Hemoglobin A1C (Completed)   Comp Met (CMET) (Completed)   Subclinical hypothyroidism    She is not tolerating the low dose of levthyroxine that was started 3 months ago despite normalization of her TSH.  Will suspend medidation as a trial   Lab Results  Component Value Date   TSH 2.35 11/04/2017         Relevant Orders   T4, free (Completed)   TSH (Completed)   Cough in adult patient - Primary    Present for the past month, following a viral URI>  Chest x ray today shows no acute changes , and stable scarring lung at base.  Will treat with  cough suppressants, PPI       Relevant Orders   DG Chest 2 View (Completed)   Cervical radiculopathy at C8    Plain films of neck ordered. Only mild degenerative changes with osteophyte formation noted.  Trial of NSAID, gaabapentin and muscle relaxer and modification of activities      Relevant Medications   gabapentin (NEURONTIN) 100 MG capsule   cyclobenzaprine (FLEXERIL) 10 MG tablet   Other Relevant Orders   DG Cervical Spine Complete (Completed)   DG Chest 2 View (Completed)     A total of 40 minutes was spent with patient more than half of which was spent in counseling patient on the above mentioned issues , reviewing and explaining recent labs and imaging studies done, and coordination of care. I am having Flossie Dibble "Cathy" start on gabapentin, cyclobenzaprine, and pantoprazole. I am also having her maintain her aspirin, rosuvastatin, cyanocobalamin, metFORMIN, omeprazole, Vitamin D3, Levothyroxine Sodium, glipiZIDE, JANUVIA, and metoprolol succinate.  Meds ordered this encounter  Medications  . gabapentin (NEURONTIN) 100 MG capsule    Sig: Take 1 capsule (100 mg total) by mouth 3 (three) times daily.    Dispense:  90 capsule    Refill:  3  . cyclobenzaprine (FLEXERIL) 10 MG tablet    Sig: Take 1 tablet (10 mg  total) by mouth 3 (three) times daily as needed for muscle spasms.    Dispense:  30 tablet    Refill:  0  . pantoprazole (PROTONIX) 40 MG tablet    Sig: Take 1 tablet (40 mg total) by mouth daily.    Dispense:  30 tablet    Refill:  3    There are no discontinued medications.  Follow-up: Return in about 6 months (around 05/06/2018) for follow up diabetes.   Crecencio Mc, MD

## 2017-11-06 DIAGNOSIS — M5412 Radiculopathy, cervical region: Secondary | ICD-10-CM | POA: Insufficient documentation

## 2017-11-06 NOTE — Assessment & Plan Note (Addendum)
She is not tolerating the low dose of levthyroxine that was started 3 months ago despite normalization of her TSH.  Will suspend medidation as a trial   Lab Results  Component Value Date   TSH 2.35 11/04/2017

## 2017-11-06 NOTE — Assessment & Plan Note (Addendum)
Plain films of neck ordered. Only mild degenerative changes with osteophyte formation noted.  Trial of NSAID, gaabapentin and muscle relaxer and modification of activities

## 2017-11-06 NOTE — Assessment & Plan Note (Signed)
Present for the past month, following a viral URI>  Chest x ray today shows no acute changes , and stable scarring lung at base.  Will treat with cough suppressants, PPI

## 2017-11-15 ENCOUNTER — Other Ambulatory Visit: Payer: Self-pay | Admitting: Cardiovascular Disease

## 2017-12-06 ENCOUNTER — Telehealth: Payer: Self-pay

## 2017-12-06 NOTE — Telephone Encounter (Signed)
Copied from [place] [phone]. Topic: Inquiry >> Dec 06, 2017  8:08 AM Conception Chancy, NT wrote: Reason for CRM: patient is calling and states that she was seen by Dr. Derrel Nip 11/04/17 and was told if  she was not better to follow up in 3 weeks. She states she called on Friday to schedule a appointment because she now has a knot on the side of her neck but was told to call back on Monday to schedule a same day appt for Monday. There is no availability and she would like to be seen in this office today and if not tomorrow. Please contact pt to schedule appt.   Patient has been scheduled to see Dr. Derrel Nip on 12-07-17.

## 2017-12-07 ENCOUNTER — Encounter: Payer: Self-pay | Admitting: Internal Medicine

## 2017-12-07 ENCOUNTER — Ambulatory Visit: Payer: Medicare Other | Admitting: Internal Medicine

## 2017-12-07 VITALS — BP 122/66 | HR 90 | Temp 98.3°F | Resp 15 | Ht 63.0 in | Wt 154.2 lb

## 2017-12-07 DIAGNOSIS — R05 Cough: Secondary | ICD-10-CM | POA: Diagnosis not present

## 2017-12-07 DIAGNOSIS — R058 Other specified cough: Secondary | ICD-10-CM

## 2017-12-07 DIAGNOSIS — R1310 Dysphagia, unspecified: Secondary | ICD-10-CM | POA: Diagnosis not present

## 2017-12-07 DIAGNOSIS — R1319 Other dysphagia: Secondary | ICD-10-CM

## 2017-12-07 DIAGNOSIS — R059 Cough, unspecified: Secondary | ICD-10-CM

## 2017-12-07 DIAGNOSIS — R221 Localized swelling, mass and lump, neck: Secondary | ICD-10-CM

## 2017-12-07 DIAGNOSIS — R6881 Early satiety: Secondary | ICD-10-CM

## 2017-12-07 MED ORDER — BENZONATATE 200 MG PO CAPS: 200.0000 mg | ORAL_CAPSULE | Freq: Three times a day (TID) | ORAL | 1 refills | Status: DC | PRN

## 2017-12-07 MED ORDER — TRAMADOL HCL 50 MG PO TABS: 50.0000 mg | ORAL_TABLET | Freq: Four times a day (QID) | ORAL | 0 refills | Status: DC | PRN

## 2017-12-07 NOTE — Patient Instructions (Addendum)
Trial of tramadol every 6 hours as needed for head/neck pain ok to combine with tylenol  Continue protonix and adding tessal perles for cough  Referral to GI in progress   CT neck ordered to evaluate lump and right sided neck pain

## 2017-12-07 NOTE — Progress Notes (Signed)
Subjective:  Patient ID: Dana Ramirez, female    DOB: [birth year]  Age: 67 y.o. MRN: [id number]  CC: The primary encounter diagnosis was Cough present for greater than 3 weeks. Diagnoses of Esophageal dysphagia, Early satiety, Localized swelling, mass or lump of neck, Mass of right side of neck, and Cough in adult patient were also pertinent to this visit.  HPI Dana Ramirez presents for evaluation of painful lump on neck near her collarbone.  The lump has been present for 5 days .     Treated for cough , right shoulder and neck pain In April  .  Cervical and  chest  X ray done,  Degenerative changes (mild) and  Stable right lung base scarring,  From prior pneumonias .   Was treated with flexeril , gabapentin and protonix for cough presumed to be due to GERD   Still coughing, nonproductive cough occurs 4 to 5 times per day . Cough becomes paroxysmal.  Becomes wheezy. Taking protonix,    Neck pain still occurring  Every afternoon  AND RADIATES TO RIGHT SIDE OF FACE      Outpatient Medications Prior to Visit  Medication Sig Dispense Refill  . aspirin (ASPIR-LOW) 81 MG EC tablet Take 162 mg by mouth daily.      . Cholecalciferol (VITAMIN D3) 1000 units CAPS Take 1 capsule by mouth daily.    . cyanocobalamin 1000 MCG tablet Take 1,000 mcg by mouth daily.    . cyclobenzaprine (FLEXERIL) 10 MG tablet Take 1 tablet (10 mg total) by mouth 3 (three) times daily as needed for muscle spasms. 30 tablet 0  . gabapentin (NEURONTIN) 100 MG capsule Take 1 capsule (100 mg total) by mouth 3 (three) times daily. 90 capsule 3  . glipiZIDE (GLUCOTROL XL) 2.5 MG 24 hr tablet TAKE ONE TABLET BY MOUTH EVERY MORNING WITH BREAKFAST 90 tablet 1  . JANUVIA 100 MG tablet TAKE ONE TABLET BY MOUTH DAILY 90 tablet 1  . metFORMIN (GLUCOPHAGE) 1000 MG tablet TAKE ONE TABLET BY MOUTH DAILY 90 tablet 1  . metoprolol succinate (TOPROL-XL) 25 MG 24 hr tablet TAKE 1 TABLET DAILY 90 tablet 3  . omeprazole  (PRILOSEC OTC) 20 MG tablet Take 20 mg by mouth daily.    . pantoprazole (PROTONIX) 40 MG tablet Take 1 tablet (40 mg total) by mouth daily. 30 tablet 3  . rosuvastatin (CRESTOR) 20 MG tablet TAKE ONE TABLET BY MOUTH DAILY 90 tablet 1  . Levothyroxine Sodium 25 MCG CAPS Take 1 capsule (25 mcg total) by mouth daily before breakfast. (Patient not taking: Reported on 12/07/2017) 90 capsule 1   No facility-administered medications prior to visit.     Review of Systems;  Patient denies headache, fevers, malaise, unintentional weight loss, skin rash, eye pain, sinus congestion and sinus pain, sore throat, dysphagia,  hemoptysis , cough, dyspnea, wheezing, chest pain, palpitations, orthopnea, edema, abdominal pain, nausea, melena, diarrhea, constipation, flank pain, dysuria, hematuria, urinary  Frequency, nocturia, numbness, tingling, seizures,  Focal weakness, Loss of consciousness,  Tremor, insomnia, depression, anxiety, and suicidal ideation.      Objective:  BP 122/66 (BP Location: Left Arm, Patient Position: Sitting, Cuff Size: Normal)   Pulse 90   Temp 98.3 F (36.8 C) (Oral)   Resp 15   Ht 5\' 3"  (1.6 m)   Wt 154 lb 3.2 oz (69.9 kg)   SpO2 95%   BMI 27.32 kg/m   BP Readings from Last 3 Encounters:  12/07/17 122/66  11/04/17 110/80  07/26/17 130/72    Wt Readings from Last 3 Encounters:  12/07/17 154 lb 3.2 oz (69.9 kg)  11/04/17 154 lb 12.8 oz (70.2 kg)  07/26/17 156 lb (70.8 kg)    General appearance: alert, cooperative and appears stated age Ears: normal TM's and external ear canals both ears Throat: lips, mucosa, and tongue normal; teeth and gums normal Neck: no adenopathy, no carotid bruit, supple, symmetrical, trachea midline and thyroid not enlarged, symmetric, no tenderness/mass/nodules Back: symmetric, no curvature. ROM normal. No CVA tenderness. Lungs: clear to auscultation bilaterally Heart: regular rate and rhythm, S1, S2 normal, no murmur, click, rub or  gallop Abdomen: soft, non-tender; bowel sounds normal; no masses,  no organomegaly Pulses: 2+ and symmetric Skin: Skin color, texture, turgor normal. No rashes or lesions Lymph nodes: Cervical, supraclavicular, and axillary nodes normal.  Lab Results  Component Value Date   HGBA1C 7.3 (H) 11/04/2017   HGBA1C 7.1 (H) 07/05/2017   HGBA1C 7.2 (H) 04/05/2017    Lab Results  Component Value Date   CREATININE 0.70 11/04/2017   CREATININE 0.73 09/06/2017   CREATININE 0.74 04/05/2017    Lab Results  Component Value Date   WBC 4.8 07/03/2016   HGB 12.6 07/03/2016   HCT 37.3 07/03/2016   PLT 192.0 07/03/2016   GLUCOSE 210 (H) 11/04/2017   CHOL 126 04/05/2017   TRIG 111.0 04/05/2017   HDL 67.00 04/05/2017   LDLDIRECT 47.0 04/05/2017   LDLCALC 37 04/05/2017   ALT 12 11/04/2017   AST 15 11/04/2017   NA 137 11/04/2017   K 4.2 11/04/2017   CL 103 11/04/2017   CREATININE 0.70 11/04/2017   BUN 20 11/04/2017   CO2 26 11/04/2017   TSH 2.35 11/04/2017   HGBA1C 7.3 (H) 11/04/2017   MICROALBUR 0.9 04/05/2017    Mm Screening Breast Tomo Bilateral  Result Date: 10/26/2017 CLINICAL DATA:  Screening. EXAM: DIGITAL SCREENING BILATERAL MAMMOGRAM WITH TOMO AND CAD COMPARISON:  Previous exam(s). ACR Breast Density Category b: There are scattered areas of fibroglandular density. FINDINGS: There are no findings suspicious for malignancy. Images were processed with CAD. IMPRESSION: No mammographic evidence of malignancy. A result letter of this screening mammogram will be mailed directly to the patient. RECOMMENDATION: Screening mammogram in one year. (Code:SM-B-01Y) BI-RADS CATEGORY  1: Negative. Electronically Signed   By: Fidela Salisbury M.D.   On: 10/26/2017 09:05    Assessment & Plan:   Problem List Items Addressed This Visit    Mass of right side of neck    She has an enlarged LN on the right supraclavicular fossa and is having neck pain the radiates to the right side of her face .  CT  neck ordered       Relevant Orders   CT Soft Tissue Neck W Contrast   Cough in adult patient    No improvement with empiric treatment of GERD.  Chest x ray non revealing.  Lifelong nonsmoker.  Given concurrent symptoms of dysphagia, referring to GI for EGD to rule out EE        Other Visit Diagnoses    Cough present for greater than 3 weeks    -  Primary   Relevant Orders   Ambulatory referral to Gastroenterology   Esophageal dysphagia       Relevant Orders   Ambulatory referral to Gastroenterology   Early satiety       Relevant Orders   Ambulatory referral to Gastroenterology   Localized swelling, mass or  lump of neck       Relevant Orders   CT Soft Tissue Neck W Contrast     A total of 25 minutes of face to face time was spent with patient more than half of which was spent in counselling about the above mentioned conditions  and coordination of care   I am having Flossie Dibble "Cathy" start on traMADol and benzonatate. I am also having her maintain her aspirin, cyanocobalamin, metFORMIN, omeprazole, Vitamin D3, Levothyroxine Sodium, glipiZIDE, JANUVIA, metoprolol succinate, gabapentin, cyclobenzaprine, pantoprazole, and rosuvastatin.  Meds ordered this encounter  Medications  . traMADol (ULTRAM) 50 MG tablet    Sig: Take 1 tablet (50 mg total) by mouth every 6 (six) hours as needed.    Dispense:  30 tablet    Refill:  0  . benzonatate (TESSALON) 200 MG capsule    Sig: Take 1 capsule (200 mg total) by mouth 3 (three) times daily as needed for cough.    Dispense:  60 capsule    Refill:  1    There are no discontinued medications.  Follow-up: Return in about 1 month (around 01/04/2018) for cough, neck pain .   Crecencio Mc, MD

## 2017-12-08 ENCOUNTER — Encounter (INDEPENDENT_AMBULATORY_CARE_PROVIDER_SITE_OTHER): Payer: Self-pay

## 2017-12-08 DIAGNOSIS — R221 Localized swelling, mass and lump, neck: Secondary | ICD-10-CM | POA: Insufficient documentation

## 2017-12-08 NOTE — Assessment & Plan Note (Signed)
She has an enlarged LN on the right supraclavicular fossa and is having neck pain the radiates to the right side of her face .  CT neck ordered

## 2017-12-08 NOTE — Assessment & Plan Note (Signed)
No improvement with empiric treatment of GERD.  Chest x ray non revealing.  Lifelong nonsmoker.  Given concurrent symptoms of dysphagia, referring to GI for EGD to rule out EE

## 2017-12-16 ENCOUNTER — Ambulatory Visit: Payer: Medicare Other

## 2017-12-20 ENCOUNTER — Ambulatory Visit
Admission: RE | Admit: 2017-12-20 | Discharge: 2017-12-20 | Disposition: A | Discharge status: Home / Self Care | Payer: Medicare Other | Source: Ambulatory Visit | Attending: Internal Medicine | Admitting: Internal Medicine

## 2017-12-20 ENCOUNTER — Other Ambulatory Visit: Payer: Self-pay | Admitting: Internal Medicine

## 2017-12-20 ENCOUNTER — Telehealth: Payer: Self-pay | Admitting: Internal Medicine

## 2017-12-20 DIAGNOSIS — R221 Localized swelling, mass and lump, neck: Secondary | ICD-10-CM | POA: Diagnosis present

## 2017-12-20 LAB — POCT I-STAT CREATININE: Creatinine, Ser: 0.7 mg/dL (ref 0.44–1.00)

## 2017-12-20 MED ORDER — IOPAMIDOL (ISOVUE-300) INJECTION 61%
75.0000 mL | Freq: Once | INTRAVENOUS | Status: AC | PRN
  Administered 2017-12-20: 75 mL via INTRAVENOUS

## 2017-12-20 NOTE — Telephone Encounter (Signed)
Abnormal CT results called in by [facility].  See imaging results in Epic, now.

## 2017-12-20 NOTE — Assessment & Plan Note (Signed)
CT neck showing necrotic LN of > 2 cm and massive necrotic LAD in upper chest worrisome for metastatic CA.  Patient notified,  URGENT hem/onc referral made

## 2017-12-21 ENCOUNTER — Inpatient Hospital Stay: Payer: Medicare Other | Attending: Oncology | Admitting: Oncology

## 2017-12-21 ENCOUNTER — Other Ambulatory Visit: Payer: Self-pay

## 2017-12-21 ENCOUNTER — Encounter: Payer: Self-pay | Admitting: Oncology

## 2017-12-21 ENCOUNTER — Inpatient Hospital Stay: Payer: Medicare Other

## 2017-12-21 VITALS — BP 136/79 | HR 79 | Temp 96.6°F | Resp 16 | Ht 63.0 in | Wt 152.0 lb

## 2017-12-21 DIAGNOSIS — Z5111 Encounter for antineoplastic chemotherapy: Secondary | ICD-10-CM | POA: Insufficient documentation

## 2017-12-21 DIAGNOSIS — R062 Wheezing: Secondary | ICD-10-CM | POA: Insufficient documentation

## 2017-12-21 DIAGNOSIS — F419 Anxiety disorder, unspecified: Secondary | ICD-10-CM | POA: Insufficient documentation

## 2017-12-21 DIAGNOSIS — R05 Cough: Secondary | ICD-10-CM | POA: Diagnosis not present

## 2017-12-21 DIAGNOSIS — C3491 Malignant neoplasm of unspecified part of right bronchus or lung: Secondary | ICD-10-CM | POA: Diagnosis present

## 2017-12-21 DIAGNOSIS — L040 Acute lymphadenitis of face, head and neck: Secondary | ICD-10-CM | POA: Diagnosis not present

## 2017-12-21 DIAGNOSIS — R221 Localized swelling, mass and lump, neck: Secondary | ICD-10-CM | POA: Insufficient documentation

## 2017-12-21 DIAGNOSIS — R590 Localized enlarged lymph nodes: Secondary | ICD-10-CM

## 2017-12-21 DIAGNOSIS — D729 Disorder of white blood cells, unspecified: Secondary | ICD-10-CM

## 2017-12-21 LAB — CBC WITH DIFFERENTIAL/PLATELET
Basophils Absolute: 0 10*3/uL (ref 0–0.1)
Basophils Relative: 1 %
EOS ABS: 0.4 10*3/uL (ref 0–0.7)
EOS PCT: 5 %
HCT: 37.5 % (ref 35.0–47.0)
HEMOGLOBIN: 12.9 g/dL (ref 12.0–16.0)
LYMPHS ABS: 1.3 10*3/uL (ref 1.0–3.6)
Lymphocytes Relative: 14 %
MCH: 31.4 pg (ref 26.0–34.0)
MCHC: 34.5 g/dL (ref 32.0–36.0)
MCV: 90.9 fL (ref 80.0–100.0)
Monocytes Absolute: 0.7 10*3/uL (ref 0.2–0.9)
Monocytes Relative: 8 %
NEUTROS PCT: 74 %
Neutro Abs: 7 10*3/uL — ABNORMAL HIGH (ref 1.4–6.5)
PLATELETS: 213 10*3/uL (ref 150–440)
RBC: 4.12 MIL/uL (ref 3.80–5.20)
RDW: 13.7 % (ref 11.5–14.5)
WBC: 9.5 10*3/uL (ref 3.6–11.0)

## 2017-12-21 LAB — COMPREHENSIVE METABOLIC PANEL
ALBUMIN: 3.9 g/dL (ref 3.5–5.0)
ALK PHOS: 93 U/L (ref 38–126)
ALT: 11 U/L — AB (ref 14–54)
AST: 16 U/L (ref 15–41)
Anion gap: 11 (ref 5–15)
BUN: 16 mg/dL (ref 6–20)
CALCIUM: 10 mg/dL (ref 8.9–10.3)
CHLORIDE: 102 mmol/L (ref 101–111)
CO2: 26 mmol/L (ref 22–32)
CREATININE: 0.69 mg/dL (ref 0.44–1.00)
GFR calc Af Amer: >60 mL/min (ref >60)
GFR calc non Af Amer: >60 mL/min (ref >60)
Glucose, Bld: 144 mg/dL — ABNORMAL HIGH (ref 65–99)
Potassium: 4.6 mmol/L (ref 3.5–5.1)
SODIUM: 139 mmol/L (ref 135–145)
Total Bilirubin: 0.3 mg/dL (ref 0.3–1.2)
Total Protein: 7.9 g/dL (ref 6.5–8.1)

## 2017-12-21 LAB — TECHNOLOGIST SMEAR REVIEW

## 2017-12-21 LAB — URIC ACID: Uric Acid, Serum: 6.1 mg/dL (ref 2.3–6.6)

## 2017-12-21 LAB — LACTATE DEHYDROGENASE: LDH: 181 U/L (ref 98–192)

## 2017-12-21 MED ORDER — ALBUTEROL SULFATE HFA 108 (90 BASE) MCG/ACT IN AERS: 2.0000 | INHALATION_SPRAY | Freq: Four times a day (QID) | RESPIRATORY_TRACT | 2 refills | Status: DC | PRN

## 2017-12-21 NOTE — Progress Notes (Signed)
Patient c/o right side neck pain.

## 2017-12-21 NOTE — Progress Notes (Addendum)
Hematology/Oncology Consult note [facility] [phone] [phone]   Patient Care Team: Crecencio Mc, MD as PCP - General (Internal Medicine) Minna Merritts, MD (Cardiology)  REFERRING PROVIDER: Crecencio Mc, MD CHIEF COMPLAINTS/PURPOSE OF CONSULTATION:  Evaluation of Neck mass  HISTORY OF PRESENTING ILLNESS:  Dana Ramirez is a  67 y.o.  female with PMH listed below who was referred to me for evaluation of neck mass. Patient reports having cough for about 2 to 3 months.  Started after a mild Upper respiratory infection, and becomes chronic.  Associate with throat discomfort.   Denies any hemoptysis, yellowish or greenish sputum. Nothing make cough better or worse.  She also noticed right neck lump for couple of weeks, the neck lump is tender.  Associated with right side neck,  and right side headache.  Denies any vision changes.Her appetite has decreased for the past couple of months and lost  a few pounds. Denies any night sweating, fever or chills. She is a never smoker, husband  and used to smoke at home. Denies any known family history of cancer.  Data Reviewed: 12/20/2017 CT neck soft tissue with contrast showed palpable abnormality represent a necrotic right supraclavicular lymph node measuring 2.2 cm in diameter.  This is the tip of the iceberg as there is massive necrotic lymphadenopathy in the right hilum and mediastinum, starting to be malignant.  Presumed lung primary is not demonstrated in the upper half of the chest.  Review of Systems  Constitutional: Positive for malaise/fatigue and weight loss. Negative for chills and fever.  HENT: Negative for congestion, ear discharge, ear pain, nosebleeds, sinus pain and sore throat.   Eyes: Negative for double vision, photophobia, pain, discharge and redness.  Respiratory: Positive for cough and wheezing. Negative for hemoptysis, sputum production and shortness of breath.     Cardiovascular: Negative for chest pain, palpitations, orthopnea, claudication and leg swelling.  Gastrointestinal: Negative for abdominal pain, blood in stool, constipation, diarrhea, heartburn, melena, nausea and vomiting.  Genitourinary: Negative for dysuria, flank pain, frequency and hematuria.  Musculoskeletal: Negative for back pain, myalgias and neck pain.  Skin: Negative for itching and rash.  Neurological: Negative for dizziness, tingling, tremors, focal weakness, weakness and headaches.  Endo/Heme/Allergies: Negative for environmental allergies. Does not bruise/bleed easily.  Psychiatric/Behavioral: Negative for depression and hallucinations. The patient is not nervous/anxious.     MEDICAL HISTORY:  Past Medical History:  Diagnosis Date  . CAD (coronary artery disease)   . Carotid arterial disease ([place])   . Diabetes mellitus without complication ([place])   . History of vertigo   . Hyperlipidemia   . Hypertension   . Kidney stone     SURGICAL HISTORY: Past Surgical History:  Procedure Laterality Date  . BREAST CYST ASPIRATION Left 1980s  . CESAREAN SECTION     x 2  . CHOLECYSTECTOMY    . CORONARY ARTERY BYPASS GRAFT  03/2005   LIMA to the LAD, vein graft to diagonal with a negative stress test in 06/2006  . TONSILLECTOMY      SOCIAL HISTORY: Social History   Socioeconomic History  . Marital status: Married    Spouse name: Elenore Rota   . Number of children: 2  . Years of education: Not on file  . Highest education level: Not on file  Occupational History  . Occupation: Retired     Fish farm manager: OTHER    Comment: [employer]  Social Needs  . Financial resource strain: Not on file  .  Food insecurity:    Worry: Not on file    Inability: Not on file  . Transportation needs:    Medical: Not on file    Non-medical: Not on file  Tobacco Use  . Smoking status: Never Smoker  . Smokeless tobacco: Never Used  Substance and Sexual Activity  . Alcohol use: No  . Drug use: No   . Sexual activity: Not on file  Lifestyle  . Physical activity:    Days per week: Not on file    Minutes per session: Not on file  . Stress: Not on file  Relationships  . Social connections:    Talks on phone: Not on file    Gets together: Not on file    Attends religious service: Not on file    Active member of club or organization: Not on file    Attends meetings of clubs or organizations: Not on file    Relationship status: Not on file  . Intimate partner violence:    Fear of current or ex partner: Not on file    Emotionally abused: Not on file    Physically abused: Not on file    Forced sexual activity: Not on file  Other Topics Concern  . Not on file  Social History Narrative   Married   Gets regular exercise, once or twice every week    FAMILY HISTORY: Family History  Problem Relation Age of Onset  . Heart disease Mother   . Heart disease Father 63  . Stroke Sister 14  . Heart disease Sister   . Diabetes Brother   . Coronary artery disease Other        Strong family history of CAD and CABG  . Heart disease Maternal Grandmother   . Breast cancer Maternal Grandmother   . Heart disease Maternal Grandfather   . Heart disease Paternal Grandmother   . Breast cancer Paternal Grandmother 33  . Heart disease Paternal Grandfather     ALLERGIES:  is allergic to codeine; lisinopril; and penicillins.  MEDICATIONS:  Current Outpatient Medications  Medication Sig Dispense Refill  . aspirin (ASPIR-LOW) 81 MG EC tablet Take 162 mg by mouth daily.      . benzonatate (TESSALON) 200 MG capsule Take 1 capsule (200 mg total) by mouth 3 (three) times daily as needed for cough. 60 capsule 1  . Cholecalciferol (VITAMIN D3) 1000 units CAPS Take 1 capsule by mouth daily.    . cyanocobalamin 1000 MCG tablet Take 1,000 mcg by mouth daily.    . cyclobenzaprine (FLEXERIL) 10 MG tablet Take 1 tablet (10 mg total) by mouth 3 (three) times daily as needed for muscle spasms. 30 tablet 0  .  gabapentin (NEURONTIN) 100 MG capsule Take 1 capsule (100 mg total) by mouth 3 (three) times daily. 90 capsule 3  . glipiZIDE (GLUCOTROL XL) 2.5 MG 24 hr tablet TAKE ONE TABLET BY MOUTH EVERY MORNING WITH BREAKFAST 90 tablet 1  . JANUVIA 100 MG tablet TAKE ONE TABLET BY MOUTH DAILY 90 tablet 1  . metFORMIN (GLUCOPHAGE) 1000 MG tablet TAKE ONE TABLET BY MOUTH DAILY 90 tablet 1  . metoprolol succinate (TOPROL-XL) 25 MG 24 hr tablet TAKE 1 TABLET DAILY 90 tablet 3  . omeprazole (PRILOSEC OTC) 20 MG tablet Take 20 mg by mouth daily.    . rosuvastatin (CRESTOR) 20 MG tablet TAKE ONE TABLET BY MOUTH DAILY 90 tablet 1  . traMADol (ULTRAM) 50 MG tablet Take 1 tablet (50 mg total) by  mouth every 6 (six) hours as needed. 30 tablet 0  . albuterol (PROVENTIL HFA;VENTOLIN HFA) 108 (90 Base) MCG/ACT inhaler Inhale 2 puffs into the lungs every 6 (six) hours as needed for wheezing or shortness of breath. 1 Inhaler 2   No current facility-administered medications for this visit.      PHYSICAL EXAMINATION: ECOG PERFORMANCE STATUS: 1 - Symptomatic but completely ambulatory Vitals:   12/21/17 1026  BP: 136/79  Pulse: 79  Resp: 16  Temp: (!) 96.6 F (35.9 C)  SpO2: 98%   Filed Weights   12/21/17 1026  Weight: 152 lb (68.9 kg)    Physical Exam  Constitutional: She is oriented to person, place, and time. She appears well-developed and well-nourished. No distress.  HENT:  Head: Normocephalic and atraumatic.  Right Ear: External ear normal.  Left Ear: External ear normal.  Mouth/Throat: Oropharynx is clear and moist.  Eyes: Pupils are equal, round, and reactive to light. Conjunctivae and EOM are normal. No scleral icterus.  Neck: Normal range of motion. Neck supple.  Right supra clavicular lymphadenopathy, about 2 cm  Cardiovascular: Normal rate, regular rhythm and normal heart sounds.  Pulmonary/Chest: Effort normal. No respiratory distress. She has wheezes. She has no rales. She exhibits no  tenderness.  Audible wheezing.   Abdominal: Soft. Bowel sounds are normal. She exhibits no distension and no mass. There is no tenderness.  Musculoskeletal: Normal range of motion. She exhibits no edema or deformity.  Lymphadenopathy:    She has no cervical adenopathy.  Neurological: She is alert and oriented to person, place, and time. No cranial nerve deficit. Coordination normal.  Skin: Skin is warm and dry. No rash noted.  Psychiatric: She has a normal mood and affect. Her behavior is normal. Thought content normal.     LABORATORY DATA:  I have reviewed the data as listed Lab Results  Component Value Date   WBC 9.5 12/21/2017   HGB 12.9 12/21/2017   HCT 37.5 12/21/2017   MCV 90.9 12/21/2017   PLT 213 12/21/2017   Recent Labs    09/06/17 0909 11/04/17 1423 12/20/17 1056 12/21/17 0922  NA 139 137  --  139  K 4.4 4.2  --  4.6  CL 104 103  --  102  CO2 28 26  --  26  GLUCOSE 125* 210*  --  144*  BUN 21 20  --  16  CREATININE 0.73 0.70 0.70 0.69  CALCIUM 9.3 9.6  --  10.0  GFRNONAA  --   --   --  >60  GFRAA  --   --   --  >60  PROT 7.4 7.3  --  7.9  ALBUMIN 4.3 4.1  --  3.9  AST 15 15  --  16  ALT 11 12  --  11*  ALKPHOS 75 82  --  93  BILITOT 0.4 0.2  --  0.3       ASSESSMENT & PLAN:  1. Neck mass   2. Mediastinal lymphadenopathy   3. Wheezing   4. Neutrophilia    # Massive necrotizing neck/mediastinum/hilum lymphadenopathy:  CT soft tissue neck with contrast was independently reviewed by me and discussed with patient in details she has,  Massive necrotic lymphadenopathy of hilum, mediastinal and supraclavicular lymph nodes,   Concerning for underlying malignancy, consider lymphoma, versus primary lung cancer,etc. I recommend PET scans for determining of disease extent, and deciding best location for lymph node excisional biopsy I will also order lab work-up including  LDH, flow cytometry, uric acid, smear review, CBC the CMP.  # Wheezing: She has audible  wheezing, I prescribed albuterol inhaler, prescription sent to pharmacy. # Neutrophilia: likely reactive. Monitor.   Patient is very anxious, all questions were answered to her satisfaction.  The patient knows to call the clinic with any problems questions or concerns.  Return of visit: after PET scan.  Thank you for this kind referral and the opportunity to participate in the care of this patient. A copy of today's note is routed to referring provider  Total face to face encounter time for this patient visit was 60 min. >50% of the time was  spent in counseling and coordination of care.    Earlie Server, MD, PhD Hematology Oncology [facility] Pager- [phone] 12/21/2017

## 2017-12-21 NOTE — Addendum Note (Signed)
Addended by: Earlie Server on: 12/21/2017 07:10 PM   Modules accepted: Level of Service

## 2017-12-23 LAB — COMP PANEL: LEUKEMIA/LYMPHOMA

## 2017-12-24 ENCOUNTER — Ambulatory Visit
Admission: RE | Admit: 2017-12-24 | Discharge: 2017-12-24 | Disposition: A | Discharge status: Home / Self Care | Payer: Medicare Other | Source: Ambulatory Visit | Attending: Oncology | Admitting: Oncology

## 2017-12-24 ENCOUNTER — Encounter: Payer: Self-pay | Admitting: *Deleted

## 2017-12-24 DIAGNOSIS — R591 Generalized enlarged lymph nodes: Secondary | ICD-10-CM

## 2017-12-24 DIAGNOSIS — C781 Secondary malignant neoplasm of mediastinum: Secondary | ICD-10-CM | POA: Insufficient documentation

## 2017-12-24 DIAGNOSIS — C7801 Secondary malignant neoplasm of right lung: Secondary | ICD-10-CM | POA: Insufficient documentation

## 2017-12-24 DIAGNOSIS — R062 Wheezing: Secondary | ICD-10-CM

## 2017-12-24 DIAGNOSIS — R918 Other nonspecific abnormal finding of lung field: Secondary | ICD-10-CM | POA: Diagnosis not present

## 2017-12-24 DIAGNOSIS — C7802 Secondary malignant neoplasm of left lung: Secondary | ICD-10-CM | POA: Insufficient documentation

## 2017-12-24 DIAGNOSIS — C7989 Secondary malignant neoplasm of other specified sites: Secondary | ICD-10-CM | POA: Insufficient documentation

## 2017-12-24 DIAGNOSIS — R590 Localized enlarged lymph nodes: Secondary | ICD-10-CM

## 2017-12-24 LAB — GLUCOSE, CAPILLARY: Glucose-Capillary: 103 mg/dL — ABNORMAL HIGH (ref 65–99)

## 2017-12-24 MED ORDER — FLUDEOXYGLUCOSE F - 18 (FDG) INJECTION
7.9800 | Freq: Once | INTRAVENOUS | Status: AC
  Administered 2017-12-24: 7.98 via INTRAVENOUS

## 2017-12-24 NOTE — Progress Notes (Signed)
  Oncology Nurse Navigator Documentation  Navigator Location: [facility] (12/24/17 1500) Referral date to RadOnc/MedOnc: 12/20/17 (12/24/17 1500) )Navigator Encounter Type: Introductory phone call (12/24/17 1500)   Abnormal Finding Date: 12/20/17 (12/24/17 1500)                     Barriers/Navigation Needs: Coordination of Care (12/24/17 1500)   Interventions: Coordination of Care (12/24/17 1500)   Coordination of Care: Appts;Radiology (12/24/17 1500)        Acuity: Level 2 (12/24/17 1500)   Acuity Level 2: Initial guidance, education and coordination as needed;Educational needs;Assistance expediting appointments (12/24/17 1500)  phone call made to patient to inform of upcoming appts and to introduce to navigator services. Pt made aware of brain mri scheduled on 6/14 at 8pm, instructed to arrive at 7:45pm to register. Informed pt that referral has been entered for pt to see Dr. Bary Castilla for excisional biopsy of supraclavicular lymphnode. Informed pt that will call their office on Monday to schedule appt and she will be notified once scheduled. Pt requested that appt not be scheduled on Wed due to having to take her father to an appt. Contact info given and instructed to call with any further questions or needs. Informed that will follow up with her next week at her appt. Pt verbalized understanding.   Time Spent with Patient: 30 (12/24/17 1500)

## 2017-12-28 ENCOUNTER — Other Ambulatory Visit: Payer: Self-pay

## 2017-12-28 ENCOUNTER — Encounter: Payer: Self-pay | Admitting: Oncology

## 2017-12-28 ENCOUNTER — Encounter: Payer: Self-pay | Admitting: *Deleted

## 2017-12-28 ENCOUNTER — Inpatient Hospital Stay (HOSPITAL_BASED_OUTPATIENT_CLINIC_OR_DEPARTMENT_OTHER): Payer: Medicare Other | Admitting: Oncology

## 2017-12-28 ENCOUNTER — Encounter: Payer: Self-pay | Admitting: General Surgery

## 2017-12-28 ENCOUNTER — Ambulatory Visit: Payer: Medicare Other | Admitting: General Surgery

## 2017-12-28 VITALS — BP 142/83 | HR 90 | Temp 97.3°F | Resp 18 | Wt 151.2 lb

## 2017-12-28 VITALS — BP 124/68 | HR 89 | Resp 12 | Ht 63.0 in | Wt 151.0 lb

## 2017-12-28 DIAGNOSIS — R590 Localized enlarged lymph nodes: Secondary | ICD-10-CM

## 2017-12-28 DIAGNOSIS — R221 Localized swelling, mass and lump, neck: Secondary | ICD-10-CM | POA: Diagnosis not present

## 2017-12-28 DIAGNOSIS — Z5111 Encounter for antineoplastic chemotherapy: Secondary | ICD-10-CM | POA: Diagnosis not present

## 2017-12-28 DIAGNOSIS — C3481 Malignant neoplasm of overlapping sites of right bronchus and lung: Secondary | ICD-10-CM | POA: Diagnosis not present

## 2017-12-28 DIAGNOSIS — R05 Cough: Secondary | ICD-10-CM | POA: Diagnosis not present

## 2017-12-28 DIAGNOSIS — R918 Other nonspecific abnormal finding of lung field: Secondary | ICD-10-CM

## 2017-12-28 NOTE — Patient Instructions (Addendum)
The patient is aware to call back for any questions or concerns.  Implanted [facility] Guide An implanted port is a type of central line that is placed under the skin. Central lines are used to provide IV access when treatment or nutrition needs to be given through a person's veins. Implanted ports are used for long-term IV access. An implanted port may be placed because:  You need IV medicine that would be irritating to the small veins in your hands or arms.  You need long-term IV medicines, such as antibiotics.  You need IV nutrition for a long period.  You need frequent blood draws for lab tests.  You need dialysis.  Implanted ports are usually placed in the chest area, but they can also be placed in the upper arm, the abdomen, or the leg. An implanted port has two main parts:  Reservoir. The reservoir is round and will appear as a small, raised area under your skin. The reservoir is the part where a needle is inserted to give medicines or draw blood.  Catheter. The catheter is a thin, flexible tube that extends from the reservoir. The catheter is placed into a large vein. Medicine that is inserted into the reservoir goes into the catheter and then into the vein.  How will I care for my incision site? Do not get the incision site wet. Bathe or shower as directed by your health care provider. How is my port accessed? Special steps must be taken to access the port:  Before the port is accessed, a numbing cream can be placed on the skin. This helps numb the skin over the port site.  Your health care provider uses a sterile technique to access the port. ? Your health care provider must put on a mask and sterile gloves. ? The skin over your port is cleaned carefully with an antiseptic and allowed to dry. ? The port is gently pinched between sterile gloves, and a needle is inserted into the port.  Only "non-coring" port needles should be used to access the port. Once the port is  accessed, a blood return should be checked. This helps ensure that the port is in the vein and is not clogged.  If your port needs to remain accessed for a constant infusion, a clear (transparent) bandage will be placed over the needle site. The bandage and needle will need to be changed every week, or as directed by your health care provider.  Keep the bandage covering the needle clean and dry. Do not get it wet. Follow your health care provider's instructions on how to take a shower or bath while the port is accessed.  If your port does not need to stay accessed, no bandage is needed over the port.  What is flushing? Flushing helps keep the port from getting clogged. Follow your health care provider's instructions on how and when to flush the port. Ports are usually flushed with saline solution or a medicine called heparin. The need for flushing will depend on how the port is used.  If the port is used for intermittent medicines or blood draws, the port will need to be flushed: ? After medicines have been given. ? After blood has been drawn. ? As part of routine maintenance.  If a constant infusion is running, the port may not need to be flushed.  How long will my port stay implanted? The port can stay in for as long as your health care provider thinks it is needed.  When it is time for the port to come out, surgery will be done to remove it. The procedure is similar to the one performed when the port was put in. When should I seek immediate medical care? When you have an implanted port, you should seek immediate medical care if:  You notice a bad smell coming from the incision site.  You have swelling, redness, or drainage at the incision site.  You have more swelling or pain at the port site or the surrounding area.  You have a fever that is not controlled with medicine.  This information is not intended to replace advice given to you by your health care provider. Make sure you  discuss any questions you have with your health care provider. Document Released: 07/06/2005 Document Revised: 12/12/2015 Document Reviewed: 03/13/2013 Elsevier Interactive Patient Education  2017 [employer].  The patient is scheduled for surgery at [facility] on 12/31/17. She will pre admit 2 hours prior to surgery. The patient is aware of date and instructions.

## 2017-12-28 NOTE — Progress Notes (Signed)
Hematology/Oncology Consult note [facility] Telephone:([phone] [phone]   Patient Care Team: Crecencio Mc, MD as PCP - General (Internal Medicine) Minna Merritts, MD (Cardiology) Telford Nab, RN as Registered Nurse Bary Castilla, Forest Gleason, MD (General Surgery)  REFERRING PROVIDER: Crecencio Mc, MD CHIEF COMPLAINTS/PURPOSE OF CONSULTATION:  Evaluation of Neck mass  HISTORY OF PRESENTING ILLNESS:  Dana Ramirez is a  67 y.o.  female with PMH listed below who was referred to me for evaluation of neck mass. Patient reports having cough for about 2 to 3 months.  Started after a mild Upper respiratory infection, and becomes chronic.  Associate with throat discomfort.   Denies any hemoptysis, yellowish or greenish sputum. Nothing make cough better or worse.  She also noticed right neck lump for couple of weeks, the neck lump is tender.  Associated with right side neck,  and right side headache.  Denies any vision changes.Her appetite has decreased for the past couple of months and lost  a few pounds. Denies any night sweating, fever or chills. She is a never smoker, husband  and used to smoke at home. Denies any known family history of cancer.  Data Reviewed: 12/20/2017 CT neck soft tissue with contrast showed palpable abnormality represent a necrotic right supraclavicular lymph node measuring 2.2 cm in diameter.  This is the tip of the iceberg as there is massive necrotic lymphadenopathy in the right hilum and mediastinum, starting to be malignant.  Presumed lung primary is not demonstrated in the upper half of the chest.  INTERVAL HISTORY Dana Ramirez is a 67 y.o. female who has above history reviewed by me today presents for follow up visit for management of  Newly discovered lung mass.  Patient has had PET scan done during interval and present to discuss results and future management plan. She continues to have wheezing and  shortness of breath with exertion.  Uses albuterol but does not feel much improvement. Continue to have fatigue, not worsened not improved.  Review of Systems  Constitutional: Positive for malaise/fatigue and weight loss. Negative for chills and fever.  HENT: Negative for congestion, ear discharge, ear pain, nosebleeds, sinus pain and sore throat.   Eyes: Negative for double vision, photophobia, pain, discharge and redness.  Respiratory: Positive for cough and wheezing. Negative for hemoptysis, sputum production and shortness of breath.   Cardiovascular: Negative for chest pain, palpitations, orthopnea, claudication and leg swelling.  Gastrointestinal: Negative for abdominal pain, blood in stool, constipation, diarrhea, heartburn, melena, nausea and vomiting.  Genitourinary: Negative for dysuria, flank pain, frequency and hematuria.  Musculoskeletal: Negative for back pain, myalgias and neck pain.  Skin: Negative for itching and rash.  Neurological: Negative for dizziness, tingling, tremors, focal weakness, weakness and headaches.  Endo/Heme/Allergies: Negative for environmental allergies. Does not bruise/bleed easily.  Psychiatric/Behavioral: Negative for depression and hallucinations. The patient is not nervous/anxious.     MEDICAL HISTORY:  Past Medical History:  Diagnosis Date  . CAD (coronary artery disease)   . Carotid arterial disease ([place])   . Diabetes mellitus without complication ([place])   . History of vertigo   . Hyperlipidemia   . Hypertension   . Kidney stone     SURGICAL HISTORY: Past Surgical History:  Procedure Laterality Date  . BREAST CYST ASPIRATION Left 1980s  . CESAREAN SECTION     x 2  . CHOLECYSTECTOMY    . CORONARY ARTERY BYPASS GRAFT  03/2005   LIMA to the LAD, vein graft  to diagonal with a negative stress test in 06/2006  . TONSILLECTOMY      SOCIAL HISTORY: Social History   Socioeconomic History  . Marital status: Married    Spouse name: Elenore Rota    . Number of children: 2  . Years of education: Not on file  . Highest education level: Not on file  Occupational History  . Occupation: Retired     Fish farm manager: OTHER    Comment: [employer]  Social Needs  . Financial resource strain: Not on file  . Food insecurity:    Worry: Not on file    Inability: Not on file  . Transportation needs:    Medical: Not on file    Non-medical: Not on file  Tobacco Use  . Smoking status: Never Smoker  . Smokeless tobacco: Never Used  Substance and Sexual Activity  . Alcohol use: No  . Drug use: No  . Sexual activity: Not on file  Lifestyle  . Physical activity:    Days per week: Not on file    Minutes per session: Not on file  . Stress: Not on file  Relationships  . Social connections:    Talks on phone: Not on file    Gets together: Not on file    Attends religious service: Not on file    Active member of club or organization: Not on file    Attends meetings of clubs or organizations: Not on file    Relationship status: Not on file  . Intimate partner violence:    Fear of current or ex partner: Not on file    Emotionally abused: Not on file    Physically abused: Not on file    Forced sexual activity: Not on file  Other Topics Concern  . Not on file  Social History Narrative   Married   Gets regular exercise, once or twice every week    FAMILY HISTORY: Family History  Problem Relation Age of Onset  . Heart disease Mother   . Heart disease Father 74  . Stroke Sister 22  . Heart disease Sister   . Diabetes Brother   . Coronary artery disease Other        Strong family history of CAD and CABG  . Heart disease Maternal Grandmother   . Breast cancer Maternal Grandmother   . Heart disease Maternal Grandfather   . Heart disease Paternal Grandmother   . Breast cancer Paternal Grandmother 41  . Heart disease Paternal Grandfather     ALLERGIES:  is allergic to codeine; lisinopril; and penicillins.  MEDICATIONS:  Current Outpatient  Medications  Medication Sig Dispense Refill  . acetaminophen (TYLENOL) 650 MG CR tablet Take 650 mg by mouth every 8 (eight) hours as needed for pain.    Marland Kitchen albuterol (PROVENTIL HFA;VENTOLIN HFA) 108 (90 Base) MCG/ACT inhaler Inhale 2 puffs into the lungs every 6 (six) hours as needed for wheezing or shortness of breath. 1 Inhaler 2  . aspirin (ASPIR-LOW) 81 MG EC tablet Take 162 mg by mouth daily.      . benzonatate (TESSALON) 200 MG capsule Take 1 capsule (200 mg total) by mouth 3 (three) times daily as needed for cough. 60 capsule 1  . Cholecalciferol (VITAMIN D3) 1000 units CAPS Take 1 capsule by mouth daily.    . cyanocobalamin 1000 MCG tablet Take 1,000 mcg by mouth daily.    . cyclobenzaprine (FLEXERIL) 10 MG tablet Take 1 tablet (10 mg total) by mouth 3 (three) times daily as  needed for muscle spasms. 30 tablet 0  . glipiZIDE (GLUCOTROL XL) 2.5 MG 24 hr tablet TAKE ONE TABLET BY MOUTH EVERY MORNING WITH BREAKFAST 90 tablet 1  . JANUVIA 100 MG tablet TAKE ONE TABLET BY MOUTH DAILY 90 tablet 1  . metFORMIN (GLUCOPHAGE) 1000 MG tablet TAKE ONE TABLET BY MOUTH DAILY 90 tablet 1  . metoprolol succinate (TOPROL-XL) 25 MG 24 hr tablet TAKE 1 TABLET DAILY 90 tablet 3  . omeprazole (PRILOSEC OTC) 20 MG tablet Take 20 mg by mouth daily.    . rosuvastatin (CRESTOR) 20 MG tablet TAKE ONE TABLET BY MOUTH DAILY 90 tablet 1  . traMADol (ULTRAM) 50 MG tablet Take 1 tablet (50 mg total) by mouth every 6 (six) hours as needed. (Patient taking differently: Take 50 mg by mouth every 6 (six) hours as needed for moderate pain. ) 30 tablet 0   No current facility-administered medications for this visit.      PHYSICAL EXAMINATION: ECOG PERFORMANCE STATUS: 1 - Symptomatic but completely ambulatory Vitals:   12/28/17 1034 12/28/17 1111  BP: (!) 142/83   Pulse: 90   Resp: 18   Temp: (!) 97.3 F (36.3 C)   SpO2:  97%   Filed Weights   12/28/17 1034  Weight: 151 lb 3.2 oz (68.6 kg)    Physical Exam    Constitutional: She is oriented to person, place, and time. She appears well-developed and well-nourished. No distress.  HENT:  Head: Normocephalic and atraumatic.  Right Ear: External ear normal.  Left Ear: External ear normal.  Mouth/Throat: Oropharynx is clear and moist.  Eyes: Pupils are equal, round, and reactive to light. Conjunctivae and EOM are normal. No scleral icterus.  Neck: Normal range of motion. Neck supple.  Right supra clavicular lymphadenopathy, about 2 cm  Cardiovascular: Normal rate, regular rhythm and normal heart sounds.  Pulmonary/Chest: Effort normal. No respiratory distress. She has wheezes. She has no rales. She exhibits no tenderness.  Abdominal: Soft. Bowel sounds are normal. She exhibits no distension and no mass. There is no tenderness.  Musculoskeletal: Normal range of motion. She exhibits no edema or deformity.  Lymphadenopathy:    She has no cervical adenopathy.  Neurological: She is alert and oriented to person, place, and time. No cranial nerve deficit. Coordination normal.  Skin: Skin is warm and dry. No rash noted.  Psychiatric: She has a normal mood and affect. Her behavior is normal. Thought content normal.     LABORATORY DATA:  I have reviewed the data as listed Lab Results  Component Value Date   WBC 9.5 12/21/2017   HGB 12.9 12/21/2017   HCT 37.5 12/21/2017   MCV 90.9 12/21/2017   PLT 213 12/21/2017   Recent Labs    09/06/17 0909 11/04/17 1423 12/20/17 1056 12/21/17 0922  NA 139 137  --  139  K 4.4 4.2  --  4.6  CL 104 103  --  102  CO2 28 26  --  26  GLUCOSE 125* 210*  --  144*  BUN 21 20  --  16  CREATININE 0.73 0.70 0.70 0.69  CALCIUM 9.3 9.6  --  10.0  GFRNONAA  --   --   --  >60  GFRAA  --   --   --  >60  PROT 7.4 7.3  --  7.9  ALBUMIN 4.3 4.1  --  3.9  AST 15 15  --  16  ALT 11 12  --  11*  ALKPHOS  75 82  --  93  BILITOT 0.4 0.2  --  0.3       ASSESSMENT & PLAN:  1. Lung mass   2. Supraclavicular  lymphadenopathy    # Massive necrotizing neck/mediastinum/hilum lymphadenopathy:  PET scan was Independently reviewed by me and discussed with patient. PET showed central RUL/hilar lesion encases and markedly attenuates the bronchus intermedius, possibly primary neoplasm. There is bulky hypermetabolic metastatic disease in the right hilum, mediastinum, left hilum, and right supraclavicular space.   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  Has referred to Dr.Byrnett for excisional biopsy. Previously discussed with Dr.Rubinus, given that the LN is necrotic, prefer excisional biopsy for better diagnosis. Obtain MRI brain.   # Wheezing: Due to lung neoplasm. Refer to RadOnc for evaluation of Radiation.  # Neutrophilia: likely reactive.    All questions were answered to her satisfaction.  The patient knows to call the clinic with any problems questions or concerns.  Return of visit: after pathology resulted to finalize treatment plan.   Thank you for this kind referral and the opportunity to participate in the care of this patient. A copy of today's note is routed to referring provider  Total face to face encounter time for this patient visit was 25 min. >50% of the time was  spent in counseling and coordination of care.  Earlie Server, MD, PhD Hematology Oncology [facility] Pager- [phone] 12/28/2017

## 2017-12-28 NOTE — Progress Notes (Signed)
  Oncology Nurse Navigator Documentation  Navigator Location: [facility]-Med Onc (12/28/17 1300)   )Navigator Encounter Type: Follow-up Appt (12/28/17 1300)                     Patient Visit Type: MedOnc (12/28/17 1300) Treatment Phase: Abnormal Scans (12/28/17 1300) Barriers/Navigation Needs: Coordination of Care (12/28/17 1300)   Interventions: Coordination of Care (12/28/17 1300)   Coordination of Care: Appts (12/28/17 1300)         met with patient and family during follow up visit with Dr. Tasia Catchings to review PET scan results. All questions answered at the time of visit. Pt and family are anxious to start treatment as quickly as possible. Reassurance provided. Informed that will go ahead and schedule consult with Dr. Baruch Gouty as well as have patient discuss port placement with Dr. Bary Castilla at appt this afternoon. Order for [facility] placement faxed over to Dr. Dwyane Luo office. Reviewed upcoming appts with patient and family. Pt verbalized understanding. No further questions or needs.          Time Spent with Patient: 30 (12/28/17 1300)

## 2017-12-28 NOTE — Progress Notes (Signed)
Patient here today for follow up.   

## 2017-12-28 NOTE — Progress Notes (Signed)
Patient ID: Dana Ramirez, female   DOB: Mar 06, 1951, 67 y.o.   MRN: [id number]  Chief Complaint  Patient presents with  . Mass    HPI Dana Ramirez is a 67 y.o. female.  Here for evaluation of a subclavicular lymph node and discuss port placement referred by Dr Tasia Catchings. She states the she has had a chronic cough for 2 months, lung mass found. CT neck as 12-20-17. She states she found a right neck knot about 3 weeks ago. She states she feels like she has something"stuck" in her throat, bread is worse. PET scan 12-24-17. She is here with her husband, Elenore Rota and daughter, Margreta Journey.  HPI  Past Medical History:  Diagnosis Date  . CAD (coronary artery disease)   . Carotid arterial disease ([place])   . Diabetes mellitus without complication ([place])   . History of vertigo   . Hyperlipidemia   . Hypertension   . Kidney stone     Past Surgical History:  Procedure Laterality Date  . BREAST CYST ASPIRATION Left 1980s  . CESAREAN SECTION     x 2  . CHOLECYSTECTOMY    . CORONARY ARTERY BYPASS GRAFT  03/2005   LIMA to the LAD, vein graft to diagonal with a negative stress test in 06/2006  . TONSILLECTOMY      Family History  Problem Relation Age of Onset  . Heart disease Mother   . Heart disease Father 28  . Stroke Sister 53  . Heart disease Sister   . Diabetes Brother   . Coronary artery disease Other        Strong family history of CAD and CABG  . Heart disease Maternal Grandmother   . Breast cancer Maternal Grandmother   . Heart disease Maternal Grandfather   . Heart disease Paternal Grandmother   . Breast cancer Paternal Grandmother 72  . Heart disease Paternal Grandfather     Social History Social History   Tobacco Use  . Smoking status: Never Smoker  . Smokeless tobacco: Never Used  Substance Use Topics  . Alcohol use: No  . Drug use: No    Allergies  Allergen Reactions  . Codeine Nausea And Vomiting  . Lisinopril Cough  . Penicillins Itching    Has patient  had a PCN reaction causing immediate rash, facial/tongue/throat swelling, SOB or lightheadedness with hypotension: no Has patient had a PCN reaction causing severe rash involving mucus membranes or skin necrosis: no Has patient had a PCN reaction that required hospitalization: no Has patient had a PCN reaction occurring within the last 10 years: no If all of the above answers are "NO", then may proceed with Cephalosporin use.     Current Outpatient Medications  Medication Sig Dispense Refill  . acetaminophen (TYLENOL) 650 MG CR tablet Take 650 mg by mouth every 8 (eight) hours as needed for pain.    Marland Kitchen albuterol (PROVENTIL HFA;VENTOLIN HFA) 108 (90 Base) MCG/ACT inhaler Inhale 2 puffs into the lungs every 6 (six) hours as needed for wheezing or shortness of breath. 1 Inhaler 2  . aspirin (ASPIR-LOW) 81 MG EC tablet Take 162 mg by mouth daily.      . benzonatate (TESSALON) 200 MG capsule Take 1 capsule (200 mg total) by mouth 3 (three) times daily as needed for cough. 60 capsule 1  . Cholecalciferol (VITAMIN D3) 1000 units CAPS Take 1 capsule by mouth daily.    . cyanocobalamin 1000 MCG tablet Take 1,000 mcg by mouth daily.    Marland Kitchen  cyclobenzaprine (FLEXERIL) 10 MG tablet Take 1 tablet (10 mg total) by mouth 3 (three) times daily as needed for muscle spasms. 30 tablet 0  . glipiZIDE (GLUCOTROL XL) 2.5 MG 24 hr tablet TAKE ONE TABLET BY MOUTH EVERY MORNING WITH BREAKFAST 90 tablet 1  . JANUVIA 100 MG tablet TAKE ONE TABLET BY MOUTH DAILY 90 tablet 1  . metFORMIN (GLUCOPHAGE) 1000 MG tablet TAKE ONE TABLET BY MOUTH DAILY 90 tablet 1  . metoprolol succinate (TOPROL-XL) 25 MG 24 hr tablet TAKE 1 TABLET DAILY 90 tablet 3  . omeprazole (PRILOSEC OTC) 20 MG tablet Take 20 mg by mouth daily.    . rosuvastatin (CRESTOR) 20 MG tablet TAKE ONE TABLET BY MOUTH DAILY 90 tablet 1  . traMADol (ULTRAM) 50 MG tablet Take 1 tablet (50 mg total) by mouth every 6 (six) hours as needed. (Patient taking differently: Take  50 mg by mouth every 6 (six) hours as needed for moderate pain. ) 30 tablet 0   No current facility-administered medications for this visit.     Review of Systems Review of Systems  Constitutional: Negative.   Respiratory: Positive for cough and shortness of breath.   Cardiovascular: Negative.     Blood pressure 124/68, pulse 89, resp. rate 12, height 5\' 3"  (1.6 m), weight 151 lb (68.5 kg), SpO2 97 %.  Physical Exam Physical Exam  Constitutional: She is oriented to person, place, and time. She appears well-developed and well-nourished.  HENT:  Mouth/Throat: Oropharynx is clear and moist. No oropharyngeal exudate.  Eyes: Conjunctivae are normal.  Neck: Normal range of motion. Neck supple.    Cardiovascular: Normal rate, regular rhythm and normal heart sounds.  Pulmonary/Chest: Effort normal. She has decreased breath sounds in the right upper field, the right middle field and the right lower field.  Neurological: She is alert and oriented to person, place, and time.  Skin: Skin is warm and dry.  Psychiatric: Her behavior is normal.    Data Reviewed Chest x-ray of November 04, 2017, CT of the neck of December 20, 2017 and PET/CT of December 24, 2017 reviewed.  Chest x-ray shows significant interstitial lung disease and previous coronary bypass.  CT of the neck shows a 2 cm supraclavicular node on the right and suggestion of superior mediastinal adenopathy.  PET/CT shows a central lesion involving the right lower lobe and hilum with attenuation of the bronchus intermedius thought to be a primary neoplasm with a markedly elevated SVU of 22.  Right supraclavicular node has an S view of 11.  Medical oncology notes of earlier today reviewed.  Phone conversation with Noreene Filbert, MD from radiation oncology regarding port placement site.  CBC and comprehensive metabolic panel of December 21, 2017 were notable only for a modest elevation of serum glucose and a low ALT.  Normal white count,  hemoglobin and platelet count.  Hemoglobin A1c of November 04, 2017: 7.3.  TSH of the same date: 2.35.  Assessment    Likely advanced lung cancer.    Plan    Based on the imaging studies that the lung is the most likely primary, and at this time combined chemoradiation is planned for palliation.  We will plan for a right supraclavicular node biopsy and a left subclavian PowerPort.  The risks associated with central venous access including arterial, pulmonary and venous injury were reviewed. The possible need for additional treatment if pulmonary injury occurs (chest tube placement) was discussed.     The patient reports poor tolerance  of codeine.  She does get good pain relief with tramadol.   HPI, Physical Exam, Assessment and Plan have been scribed under the direction and in the presence of Robert Bellow, MD. Karie Fetch, RN  I have completed the exam and reviewed the above documentation for accuracy and completeness.  I agree with the above.  Haematologist has been used and any errors in dictation or transcription are unintentional.  Hervey Ard, M.D., F.A.C.S.  The patient is scheduled for surgery at [facility] on 12/31/17. She will pre admit 2 hours prior to surgery. The patient is aware of date and instructions.  Documented by Caryl-Lyn Otis Brace LPN  Forest Gleason Aron Needles 12/28/2017, 5:55 PM

## 2017-12-29 ENCOUNTER — Other Ambulatory Visit: Payer: Self-pay

## 2017-12-29 ENCOUNTER — Encounter: Payer: Self-pay | Admitting: Radiation Oncology

## 2017-12-29 ENCOUNTER — Telehealth: Payer: Self-pay

## 2017-12-29 ENCOUNTER — Ambulatory Visit
Admission: RE | Admit: 2017-12-29 | Discharge: 2017-12-29 | Disposition: A | Discharge status: Home / Self Care | Payer: Medicare Other | Source: Ambulatory Visit | Attending: Radiation Oncology | Admitting: Radiation Oncology

## 2017-12-29 ENCOUNTER — Inpatient Hospital Stay: Admission: RE | Admit: 2017-12-29 | Payer: Medicare Other | Source: Ambulatory Visit

## 2017-12-29 VITALS — BP 116/74 | HR 90 | Temp 97.3°F | Resp 20 | Wt 150.6 lb

## 2017-12-29 DIAGNOSIS — R918 Other nonspecific abnormal finding of lung field: Secondary | ICD-10-CM | POA: Insufficient documentation

## 2017-12-29 DIAGNOSIS — Z87442 Personal history of urinary calculi: Secondary | ICD-10-CM | POA: Insufficient documentation

## 2017-12-29 DIAGNOSIS — I2510 Atherosclerotic heart disease of native coronary artery without angina pectoris: Secondary | ICD-10-CM | POA: Diagnosis not present

## 2017-12-29 DIAGNOSIS — I10 Essential (primary) hypertension: Secondary | ICD-10-CM | POA: Diagnosis not present

## 2017-12-29 DIAGNOSIS — Z7984 Long term (current) use of oral hypoglycemic drugs: Secondary | ICD-10-CM | POA: Insufficient documentation

## 2017-12-29 DIAGNOSIS — E785 Hyperlipidemia, unspecified: Secondary | ICD-10-CM | POA: Insufficient documentation

## 2017-12-29 DIAGNOSIS — R062 Wheezing: Secondary | ICD-10-CM | POA: Insufficient documentation

## 2017-12-29 DIAGNOSIS — E119 Type 2 diabetes mellitus without complications: Secondary | ICD-10-CM | POA: Diagnosis not present

## 2017-12-29 DIAGNOSIS — C3481 Malignant neoplasm of overlapping sites of right bronchus and lung: Secondary | ICD-10-CM

## 2017-12-29 DIAGNOSIS — Z79899 Other long term (current) drug therapy: Secondary | ICD-10-CM | POA: Diagnosis not present

## 2017-12-29 DIAGNOSIS — R599 Enlarged lymph nodes, unspecified: Secondary | ICD-10-CM | POA: Diagnosis not present

## 2017-12-29 DIAGNOSIS — R05 Cough: Secondary | ICD-10-CM | POA: Insufficient documentation

## 2017-12-29 NOTE — Telephone Encounter (Signed)
Call to patient to notify of pre admit arrival for surgery. The patient will report to the Pre Admit testing department in the [facility] on 12/31/17 at 11:20 am. They will get her over to surgery once pre admit test has been completed.

## 2017-12-29 NOTE — Consult Note (Signed)
NEW PATIENT EVALUATION  Name: Dana Ramirez  MRN: [id number]  Date:   12/29/2017     DOB: [birth year]   This 67 y.o. 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  REFERRING PHYSICIAN: Crecencio Mc, MD  CHIEF COMPLAINT:  Chief Complaint  Patient presents with  . Lung Cancer    Pt is here for initial consultation of lung cancer    DIAGNOSIS: The encounter diagnosis was Malignant neoplasm of overlapping sites of right lung ([place]).   PREVIOUS INVESTIGATIONS:  PET/CT scans reviewed Clinical notes reviewed Biopsy pending this Friday  HPI: patient is a 67 year old female who presented with a persistent cough over 3 months which was thought to be associated with a mild upper respiratory infection. She did go on to present with a right lower neck mass consistent with supra clavicular adenopathy.CT scan was performed showing a necrotic right supraclavicular lymph node measuring 2.2 cm in diameter. There is also massive necrotic lymphadenopathy in the right hilum and mediastinum with adenopathy also in the left mediastinum. She's been seen by surgeon and biopsy of the right supraclavicular lymph node as well as port placement scheduled for this Friday. She's been seen by medical oncology with anticipation to go ahead with concurrent chemoradiation therapy as a stage IIIB disease. She is seen today continues to be wheezing. She's having no hemoptysis. She does have a slight productive cough. She's has no bone pain.  PLANNED TREATMENT REGIMEN: concurrent chemoradiation with radiation and a split course fashion  PAST MEDICAL HISTORY:  has a past medical history of CAD (coronary artery disease), Carotid arterial disease ([place]), Diabetes mellitus without complication ([place]), History of vertigo, Hyperlipidemia, Hypertension, and Kidney stone.    PAST SURGICAL HISTORY:  Past Surgical History:  Procedure  Laterality Date  . BREAST CYST ASPIRATION Left 1980s  . CESAREAN SECTION     x 2  . CHOLECYSTECTOMY    . CORONARY ARTERY BYPASS GRAFT  03/2005   LIMA to the LAD, vein graft to diagonal with a negative stress test in 06/2006  . TONSILLECTOMY      FAMILY HISTORY: family history includes Breast cancer in her maternal grandmother; Breast cancer (age of onset: 41) in her paternal grandmother; Coronary artery disease in her other; Diabetes in her brother; Heart disease in her maternal grandfather, maternal grandmother, mother, paternal grandfather, paternal grandmother, and sister; Heart disease (age of onset: 46) in her father; Stroke (age of onset: 84) in her sister.  SOCIAL HISTORY:  reports that she has never smoked. She has never used smokeless tobacco. She reports that she does not drink alcohol or use drugs.  ALLERGIES: Codeine; Lisinopril; and Penicillins  MEDICATIONS:  Current Outpatient Medications  Medication Sig Dispense Refill  . acetaminophen (TYLENOL) 650 MG CR tablet Take 650 mg by mouth every 8 (eight) hours as needed for pain.    Marland Kitchen albuterol (PROVENTIL HFA;VENTOLIN HFA) 108 (90 Base) MCG/ACT inhaler Inhale 2 puffs into the lungs every 6 (six) hours as needed for wheezing or shortness of breath. 1 Inhaler 2  . aspirin (ASPIR-LOW) 81 MG EC tablet Take 162 mg by mouth daily.      . benzonatate (TESSALON) 200 MG capsule Take 1 capsule (200 mg total) by mouth 3 (three) times daily as needed for cough. 60 capsule 1  . Cholecalciferol (VITAMIN D3) 1000 units CAPS Take 1 capsule by mouth daily.    . cyanocobalamin 1000 MCG  tablet Take 1,000 mcg by mouth daily.    . cyclobenzaprine (FLEXERIL) 10 MG tablet Take 1 tablet (10 mg total) by mouth 3 (three) times daily as needed for muscle spasms. 30 tablet 0  . glipiZIDE (GLUCOTROL XL) 2.5 MG 24 hr tablet TAKE ONE TABLET BY MOUTH EVERY MORNING WITH BREAKFAST 90 tablet 1  . JANUVIA 100 MG tablet TAKE ONE TABLET BY MOUTH DAILY 90 tablet 1  .  metFORMIN (GLUCOPHAGE) 1000 MG tablet TAKE ONE TABLET BY MOUTH DAILY 90 tablet 1  . metoprolol succinate (TOPROL-XL) 25 MG 24 hr tablet TAKE 1 TABLET DAILY 90 tablet 3  . omeprazole (PRILOSEC OTC) 20 MG tablet Take 20 mg by mouth daily.    . rosuvastatin (CRESTOR) 20 MG tablet TAKE ONE TABLET BY MOUTH DAILY 90 tablet 1  . traMADol (ULTRAM) 50 MG tablet Take 1 tablet (50 mg total) by mouth every 6 (six) hours as needed. (Patient taking differently: Take 50 mg by mouth every 6 (six) hours as needed for moderate pain. ) 30 tablet 0   No current facility-administered medications for this encounter.     ECOG PERFORMANCE STATUS:  1 - Symptomatic but completely ambulatory  REVIEW OF SYSTEMS: xcept for the cough fatigue mild weight loss Patient denies any weight loss, fatigue, weakness, fever, chills or night sweats. Patient denies any loss of vision, blurred vision. Patient denies any ringing  of the ears or hearing loss. No irregular heartbeat. Patient denies heart murmur or history of fainting. Patient denies any chest pain or pain radiating to her upper extremities. Patient denies any shortness of breath, difficulty breathing at night, cough or hemoptysis. Patient denies any swelling in the lower legs. Patient denies any nausea vomiting, vomiting of blood, or coffee ground material in the vomitus. Patient denies any stomach pain. Patient states has had normal bowel movements no significant constipation or diarrhea. Patient denies any dysuria, hematuria or significant nocturia. Patient denies any problems walking, swelling in the joints or loss of balance. Patient denies any skin changes, loss of hair or loss of weight. Patient denies any excessive worrying or anxiety or significant depression. Patient denies any problems with insomnia. Patient denies excessive thirst, polyuria, polydipsia. Patient denies any swollen glands, patient denies easy bruising or easy bleeding. Patient denies any recent infections,  allergies or URI. Patient "s visual fields have not changed significantly in recent time.   PHYSICAL EXAM: BP 116/74   Pulse 90   Temp (!) 97.3 F (36.3 C)   Resp 20   Wt 150 lb 9.2 oz (68.3 kg)   BMI 26.67 kg/m  Patient does have some bilateral expiratory wheezing.Well-developed well-nourished patient in NAD. HEENT reveals PERLA, EOMI, discs not visualized.  Oral cavity is clear. No oral mucosal lesions are identified. Neck is clear without evidence of cervical or supraclavicular adenopathy. Lungs are clear to A&P. Cardiac examination is essentially unremarkable with regular rate and rhythm without murmur rub or thrill. Abdomen is benign with no organomegaly or masses noted. Motor sensory and DTR levels are equal and symmetric in the upper and lower extremities. Cranial nerves II through XII are grossly intact. Proprioception is intact. No peripheral adenopathy or edema is identified. No motor or sensory levels are noted. Crude visual fields are within normal range.  LABORATORY DATA: pathology will reviewed when available after biopsy this Friday    RADIOLOGY RESULTS:CT scan PET CT scan reviewed MRI scan of the brain is been scheduled   IMPRESSION: probable stage IIIB non-small cell  lung cancer of the right lung in 67 year old female  PLAN: at this time I have gone ahead and scheduled CT simulation for this patient pending her biopsy results. I would plan on treating this a split course fashion up to 4000 cGy over 4 weeks and reevaluatingfor possible small field boost. Risks and benefits of treatment including radiation esophagitis fatigue alteration of blood counts persistence of cough skin reaction all were described in detail to the patient and her family. I've personally set up and ordered CT simulation. Patient also will have port placed this week for this patient of chemotherapy. Should anything change with herpathology may make treatment modifications. Patient and her family all seem to  comprehend my treatment plan well. Nurse navigator was present during our session.  I would like to take this opportunity to thank you for allowing me to participate in the care of your patient.Noreene Filbert, MD

## 2017-12-30 MED ORDER — CEFAZOLIN SODIUM-DEXTROSE 2-4 GM/100ML-% IV SOLN
2.0000 g | INTRAVENOUS | Status: AC
  Administered 2017-12-31: 2 g via INTRAVENOUS

## 2017-12-31 ENCOUNTER — Ambulatory Visit
Admission: RE | Admit: 2017-12-31 | Discharge: 2017-12-31 | Disposition: A | Discharge status: Home / Self Care | Payer: Medicare Other | Source: Ambulatory Visit | Attending: General Surgery | Admitting: General Surgery

## 2017-12-31 ENCOUNTER — Other Ambulatory Visit: Payer: Self-pay

## 2017-12-31 ENCOUNTER — Ambulatory Visit: Payer: Medicare Other | Admitting: Anesthesiology

## 2017-12-31 ENCOUNTER — Ambulatory Visit: Payer: Medicare Other

## 2017-12-31 ENCOUNTER — Ambulatory Visit
Admission: RE | Admit: 2017-12-31 | Discharge: 2017-12-31 | Disposition: A | Discharge status: Home / Self Care | Payer: Medicare Other | Source: Ambulatory Visit | Attending: Oncology | Admitting: Oncology

## 2017-12-31 ENCOUNTER — Encounter: Payer: Self-pay | Admitting: *Deleted

## 2017-12-31 ENCOUNTER — Encounter
Admission: RE | Disposition: A | Discharge status: Home / Self Care | Payer: Self-pay | Source: Ambulatory Visit | Attending: General Surgery

## 2017-12-31 DIAGNOSIS — C3481 Malignant neoplasm of overlapping sites of right bronchus and lung: Secondary | ICD-10-CM

## 2017-12-31 DIAGNOSIS — Z7984 Long term (current) use of oral hypoglycemic drugs: Secondary | ICD-10-CM | POA: Insufficient documentation

## 2017-12-31 DIAGNOSIS — C7801 Secondary malignant neoplasm of right lung: Secondary | ICD-10-CM | POA: Diagnosis not present

## 2017-12-31 DIAGNOSIS — I10 Essential (primary) hypertension: Secondary | ICD-10-CM | POA: Insufficient documentation

## 2017-12-31 DIAGNOSIS — I2510 Atherosclerotic heart disease of native coronary artery without angina pectoris: Secondary | ICD-10-CM | POA: Insufficient documentation

## 2017-12-31 DIAGNOSIS — C770 Secondary and unspecified malignant neoplasm of lymph nodes of head, face and neck: Secondary | ICD-10-CM | POA: Insufficient documentation

## 2017-12-31 DIAGNOSIS — Z7982 Long term (current) use of aspirin: Secondary | ICD-10-CM | POA: Insufficient documentation

## 2017-12-31 DIAGNOSIS — Z79899 Other long term (current) drug therapy: Secondary | ICD-10-CM | POA: Diagnosis not present

## 2017-12-31 DIAGNOSIS — R591 Generalized enlarged lymph nodes: Secondary | ICD-10-CM

## 2017-12-31 DIAGNOSIS — E785 Hyperlipidemia, unspecified: Secondary | ICD-10-CM | POA: Insufficient documentation

## 2017-12-31 DIAGNOSIS — E119 Type 2 diabetes mellitus without complications: Secondary | ICD-10-CM | POA: Insufficient documentation

## 2017-12-31 DIAGNOSIS — Z95828 Presence of other vascular implants and grafts: Secondary | ICD-10-CM

## 2017-12-31 DIAGNOSIS — R918 Other nonspecific abnormal finding of lung field: Secondary | ICD-10-CM

## 2017-12-31 HISTORY — PX: SENTINEL NODE BIOPSY: SHX6608

## 2017-12-31 HISTORY — PX: PORTACATH PLACEMENT: SHX2246

## 2017-12-31 LAB — GLUCOSE, CAPILLARY
GLUCOSE-CAPILLARY: 157 mg/dL — AB (ref 65–99)
Glucose-Capillary: 148 mg/dL — ABNORMAL HIGH (ref 65–99)

## 2017-12-31 SURGERY — INSERTION, TUNNELED CENTRAL VENOUS DEVICE, WITH PORT
Anesthesia: General | Laterality: Right

## 2017-12-31 MED ORDER — ROCURONIUM BROMIDE 100 MG/10ML IV SOLN
INTRAVENOUS | Status: DC | PRN
  Administered 2017-12-31: 20 mg via INTRAVENOUS

## 2017-12-31 MED ORDER — LIDOCAINE HCL (PF) 1 % IJ SOLN
INTRAMUSCULAR | Status: AC
  Filled 2017-12-31: qty 30

## 2017-12-31 MED ORDER — CEFAZOLIN SODIUM-DEXTROSE 2-4 GM/100ML-% IV SOLN
INTRAVENOUS | Status: AC
  Filled 2017-12-31: qty 100

## 2017-12-31 MED ORDER — DEXAMETHASONE SODIUM PHOSPHATE 10 MG/ML IJ SOLN
INTRAMUSCULAR | Status: DC | PRN
  Administered 2017-12-31: 10 mg via INTRAVENOUS

## 2017-12-31 MED ORDER — SODIUM CHLORIDE 0.9 % IV SOLN
INTRAVENOUS | Status: DC
  Administered 2017-12-31: 14:00:00 via INTRAVENOUS

## 2017-12-31 MED ORDER — SODIUM CHLORIDE 0.9 % IV SOLN
INTRAVENOUS | Status: DC
  Administered 2017-12-31: 13:00:00 via INTRAVENOUS

## 2017-12-31 MED ORDER — MIDAZOLAM HCL 2 MG/2ML IJ SOLN
INTRAMUSCULAR | Status: DC | PRN
  Administered 2017-12-31: 2 mg via INTRAVENOUS

## 2017-12-31 MED ORDER — SUGAMMADEX SODIUM 200 MG/2ML IV SOLN
INTRAVENOUS | Status: DC | PRN
  Administered 2017-12-31: 140 mg via INTRAVENOUS

## 2017-12-31 MED ORDER — PHENYLEPHRINE HCL 10 MG/ML IJ SOLN
INTRAMUSCULAR | Status: DC | PRN
  Administered 2017-12-31 (×5): 100 ug via INTRAVENOUS

## 2017-12-31 MED ORDER — FENTANYL CITRATE (PF) 100 MCG/2ML IJ SOLN
INTRAMUSCULAR | Status: AC
  Filled 2017-12-31: qty 2

## 2017-12-31 MED ORDER — PROPOFOL 10 MG/ML IV BOLUS
INTRAVENOUS | Status: DC | PRN
  Administered 2017-12-31: 80 mg via INTRAVENOUS
  Administered 2017-12-31: 130 mg via INTRAVENOUS
  Administered 2017-12-31: 70 mg via INTRAVENOUS

## 2017-12-31 MED ORDER — FAMOTIDINE 20 MG PO TABS
ORAL_TABLET | ORAL | Status: AC
  Administered 2017-12-31: 20 mg via ORAL
  Filled 2017-12-31: qty 1

## 2017-12-31 MED ORDER — SODIUM CHLORIDE FLUSH 0.9 % IV SOLN
INTRAVENOUS | Status: AC
  Filled 2017-12-31: qty 50

## 2017-12-31 MED ORDER — ONDANSETRON HCL 4 MG/2ML IJ SOLN
INTRAMUSCULAR | Status: DC | PRN
  Administered 2017-12-31 (×2): 4 mg via INTRAVENOUS

## 2017-12-31 MED ORDER — METHYLENE BLUE 0.5 % INJ SOLN
INTRAVENOUS | Status: AC
  Filled 2017-12-31: qty 10

## 2017-12-31 MED ORDER — ACETAMINOPHEN 10 MG/ML IV SOLN
INTRAVENOUS | Status: AC
  Filled 2017-12-31: qty 100

## 2017-12-31 MED ORDER — ROCURONIUM BROMIDE 50 MG/5ML IV SOLN
INTRAVENOUS | Status: AC
  Filled 2017-12-31: qty 1

## 2017-12-31 MED ORDER — BUPIVACAINE-EPINEPHRINE 0.5% -1:200000 IJ SOLN
INTRAMUSCULAR | Status: DC | PRN
  Administered 2017-12-31: 7 mL

## 2017-12-31 MED ORDER — LIDOCAINE HCL (PF) 1 % IJ SOLN
INTRAMUSCULAR | Status: DC | PRN
  Administered 2017-12-31: 10 mL

## 2017-12-31 MED ORDER — FENTANYL CITRATE (PF) 100 MCG/2ML IJ SOLN
INTRAMUSCULAR | Status: DC | PRN
  Administered 2017-12-31 (×2): 50 ug via INTRAVENOUS

## 2017-12-31 MED ORDER — MIDAZOLAM HCL 2 MG/2ML IJ SOLN
INTRAMUSCULAR | Status: AC
  Filled 2017-12-31: qty 2

## 2017-12-31 MED ORDER — SUGAMMADEX SODIUM 200 MG/2ML IV SOLN
INTRAVENOUS | Status: AC
  Filled 2017-12-31: qty 2

## 2017-12-31 MED ORDER — ALBUTEROL SULFATE HFA 108 (90 BASE) MCG/ACT IN AERS
INHALATION_SPRAY | RESPIRATORY_TRACT | Status: DC | PRN
  Administered 2017-12-31: 4 via RESPIRATORY_TRACT

## 2017-12-31 MED ORDER — BUPIVACAINE-EPINEPHRINE (PF) 0.5% -1:200000 IJ SOLN
INTRAMUSCULAR | Status: AC
  Filled 2017-12-31: qty 30

## 2017-12-31 MED ORDER — ALBUTEROL SULFATE HFA 108 (90 BASE) MCG/ACT IN AERS
INHALATION_SPRAY | RESPIRATORY_TRACT | Status: AC
  Filled 2017-12-31: qty 6.7

## 2017-12-31 MED ORDER — GLYCOPYRROLATE 0.2 MG/ML IJ SOLN
INTRAMUSCULAR | Status: DC | PRN
  Administered 2017-12-31: 0.2 mg via INTRAVENOUS

## 2017-12-31 MED ORDER — PROPOFOL 500 MG/50ML IV EMUL
INTRAVENOUS | Status: AC
  Filled 2017-12-31: qty 50

## 2017-12-31 MED ORDER — ACETAMINOPHEN 10 MG/ML IV SOLN
INTRAVENOUS | Status: DC | PRN
  Administered 2017-12-31: 1000 mg via INTRAVENOUS

## 2017-12-31 MED ORDER — FAMOTIDINE 20 MG PO TABS
20.0000 mg | ORAL_TABLET | Freq: Once | ORAL | Status: AC
  Administered 2017-12-31: 20 mg via ORAL

## 2017-12-31 MED ORDER — GADOBENATE DIMEGLUMINE 529 MG/ML IV SOLN
15.0000 mL | Freq: Once | INTRAVENOUS | Status: AC | PRN
  Administered 2017-12-31: 14 mL via INTRAVENOUS

## 2017-12-31 MED ORDER — SUCCINYLCHOLINE CHLORIDE 20 MG/ML IJ SOLN
INTRAMUSCULAR | Status: DC | PRN
  Administered 2017-12-31: 100 mg via INTRAVENOUS

## 2017-12-31 SURGICAL SUPPLY — 68 items
BINDER BREAST LRG (GAUZE/BANDAGES/DRESSINGS) IMPLANT
BINDER BREAST MEDIUM (GAUZE/BANDAGES/DRESSINGS) IMPLANT
BINDER BREAST XLRG (GAUZE/BANDAGES/DRESSINGS) IMPLANT
BINDER BREAST XXLRG (GAUZE/BANDAGES/DRESSINGS) IMPLANT
BLADE SURG 15 STRL SS SAFETY (BLADE) ×8 IMPLANT
BULB RESERV EVAC DRAIN JP 100C (MISCELLANEOUS) IMPLANT
CANISTER SUCT 1200ML W/VALVE (MISCELLANEOUS) ×4 IMPLANT
CHLORAPREP W/TINT 26ML (MISCELLANEOUS) ×4 IMPLANT
CLOSURE WOUND 1/2 X4 (GAUZE/BANDAGES/DRESSINGS) ×1
CNTNR SPEC 2.5X3XGRAD LEK (MISCELLANEOUS)
CONT SPEC 4OZ STER OR WHT (MISCELLANEOUS)
CONT SPEC 4OZ STRL OR WHT (MISCELLANEOUS)
CONTAINER SPEC 2.5X3XGRAD LEK (MISCELLANEOUS) IMPLANT
COVER LIGHT HANDLE STERIS (MISCELLANEOUS) ×8 IMPLANT
COVER PROBE FLX POLY STRL (MISCELLANEOUS) ×4 IMPLANT
DECANTER SPIKE VIAL GLASS SM (MISCELLANEOUS) ×8 IMPLANT
DEVICE DUBIN SPECIMEN MAMMOGRA (MISCELLANEOUS) ×4 IMPLANT
DRAIN CHANNEL JP 15F RND 16 (MISCELLANEOUS) IMPLANT
DRAPE C-ARM XRAY 36X54 (DRAPES) ×4 IMPLANT
DRAPE LAPAROTOMY TRNSV 106X77 (MISCELLANEOUS) ×4 IMPLANT
DRSG GAUZE FLUFF 36X18 (GAUZE/BANDAGES/DRESSINGS) ×8 IMPLANT
DRSG TEGADERM 2-3/8X2-3/4 SM (GAUZE/BANDAGES/DRESSINGS) ×4 IMPLANT
DRSG TEGADERM 4X4.75 (GAUZE/BANDAGES/DRESSINGS) ×4 IMPLANT
DRSG TELFA 3X8 NADH (GAUZE/BANDAGES/DRESSINGS) ×4 IMPLANT
DRSG TELFA 4X3 1S NADH ST (GAUZE/BANDAGES/DRESSINGS) ×4 IMPLANT
ELECT CAUTERY BLADE TIP 2.5 (TIP) ×4
ELECT REM PT RETURN 9FT ADLT (ELECTROSURGICAL) ×4
ELECTRODE CAUTERY BLDE TIP 2.5 (TIP) ×2 IMPLANT
ELECTRODE REM PT RTRN 9FT ADLT (ELECTROSURGICAL) ×2 IMPLANT
GAUZE SPONGE 4X4 12PLY STRL (GAUZE/BANDAGES/DRESSINGS) ×4 IMPLANT
GLOVE BIO SURGEON STRL SZ7.5 (GLOVE) ×4 IMPLANT
GLOVE INDICATOR 8.0 STRL GRN (GLOVE) ×4 IMPLANT
GOWN STRL REUS W/ TWL LRG LVL3 (GOWN DISPOSABLE) ×4 IMPLANT
GOWN STRL REUS W/TWL LRG LVL3 (GOWN DISPOSABLE) ×8
KIT PORT POWER 8FR ISP CVUE (Port) ×4 IMPLANT
KIT TURNOVER KIT A (KITS) ×4 IMPLANT
LABEL OR SOLS (LABEL) ×4 IMPLANT
MARGIN MAP 10MM (MISCELLANEOUS) ×4 IMPLANT
NDL HYPO 25X1 1.5 SAFETY (NEEDLE) ×4 IMPLANT
NEEDLE HYPO 22GX1.5 SAFETY (NEEDLE) ×4 IMPLANT
NEEDLE HYPO 25X1 1.5 SAFETY (NEEDLE) ×8 IMPLANT
NS IRRIG 500ML POUR BTL (IV SOLUTION) ×4 IMPLANT
PACK BASIN MINOR ARMC (MISCELLANEOUS) ×4 IMPLANT
PACK PORT-A-CATH (MISCELLANEOUS) ×4 IMPLANT
PAD DRESSING TELFA 3X8 NADH (GAUZE/BANDAGES/DRESSINGS) ×2 IMPLANT
RETRACTOR RING XSMALL (MISCELLANEOUS) ×2 IMPLANT
RTRCTR WOUND ALEXIS 13CM XS SH (MISCELLANEOUS) ×4
SHEARS FOC LG CVD HARMONIC 17C (MISCELLANEOUS) IMPLANT
SHEARS HARMONIC 9CM CVD (BLADE) ×4 IMPLANT
SLEVE PROBE SENORX GAMMA FIND (MISCELLANEOUS) ×4 IMPLANT
STRIP CLOSURE SKIN 1/2X4 (GAUZE/BANDAGES/DRESSINGS) ×3 IMPLANT
SUT ETHILON 3-0 FS-10 30 BLK (SUTURE) ×4
SUT PROLENE 3 0 SH DA (SUTURE) ×4 IMPLANT
SUT SILK 2 0 (SUTURE) ×4
SUT SILK 2-0 18XBRD TIE 12 (SUTURE) ×2 IMPLANT
SUT VIC AB 2-0 CT1 27 (SUTURE) ×12
SUT VIC AB 2-0 CT1 TAPERPNT 27 (SUTURE) ×6 IMPLANT
SUT VIC AB 3-0 SH 27 (SUTURE) ×8
SUT VIC AB 3-0 SH 27X BRD (SUTURE) ×4 IMPLANT
SUT VIC AB 4-0 FS2 27 (SUTURE) ×8 IMPLANT
SUT VICRYL+ 3-0 144IN (SUTURE) ×4 IMPLANT
SUTURE EHLN 3-0 FS-10 30 BLK (SUTURE) ×2 IMPLANT
SWABSTK COMLB BENZOIN TINCTURE (MISCELLANEOUS) ×4 IMPLANT
SYR 10ML LL (SYRINGE) ×4 IMPLANT
SYR 10ML SLIP (SYRINGE) ×4 IMPLANT
SYR BULB IRRIG 60ML STRL (SYRINGE) ×4 IMPLANT
TAPE TRANSPORE STRL 2 31045 (GAUZE/BANDAGES/DRESSINGS) ×4 IMPLANT
WATER STERILE IRR 1000ML POUR (IV SOLUTION) ×4 IMPLANT

## 2017-12-31 NOTE — Transfer of Care (Signed)
Immediate Anesthesia Transfer of Care Note  Patient: Dana Ramirez  Procedure(s) Performed: INSERTION PORT-A-CATH (Left ) SUPRACLAVICULAR NODE BIOPSY (Right )  Patient Location: PACU  Anesthesia Type:General  Level of Consciousness: drowsy and patient cooperative  Airway & Oxygen Therapy: Patient Spontanous Breathing and Patient connected to face mask oxygen  Post-op Assessment: Report given to RN and Post -op Vital signs reviewed and stable  Post vital signs: Reviewed and stable  Last Vitals:  Vitals Value Taken Time  BP 143/72 12/31/2017  4:17 PM  Temp 36.3 C 12/31/2017  4:16 PM  Pulse 90 12/31/2017  4:22 PM  Resp 14 12/31/2017  4:22 PM  SpO2 100 % 12/31/2017  4:22 PM  Vitals shown include unvalidated device data.  Last Pain:  Vitals:   12/31/17 1616  TempSrc:   PainSc: Asleep         Complications: No apparent anesthesia complications

## 2017-12-31 NOTE — Discharge Instructions (Signed)
AMBULATORY SURGERY  °DISCHARGE INSTRUCTIONS ° ° °1) The drugs that you were given will stay in your system until tomorrow so for the next 24 hours you should not: ° °A) Drive an automobile °B) Make any legal decisions °C) Drink any alcoholic beverage ° ° °2) You may resume regular meals tomorrow.  Today it is better to start with liquids and gradually work up to solid foods. ° °You may eat anything you prefer, but it is better to start with liquids, then soup and crackers, and gradually work up to solid foods. ° ° °3) Please notify your doctor immediately if you have any unusual bleeding, trouble breathing, redness and pain at the surgery site, drainage, fever, or pain not relieved by medication. ° ° ° °4) Additional Instructions: ° ° ° ° ° ° ° °Please contact your physician with any problems or Same Day Surgery at [phone], Monday through Friday 6 am to 4 pm, or [place] at [place] Main number at [phone]. °

## 2017-12-31 NOTE — H&P (Signed)
No change in clinical history or exam. For right supraclavicular node biopsy and left power port.

## 2017-12-31 NOTE — Anesthesia Procedure Notes (Signed)
Procedure Name: Intubation Date/Time: 12/31/2017 2:51 PM Performed by: Jonna Clark, CRNA Pre-anesthesia Checklist: Patient identified, Patient being monitored, Timeout performed, Emergency Drugs available and Suction available Patient Re-evaluated:Patient Re-evaluated prior to induction Oxygen Delivery Method: Circle system utilized Preoxygenation: Pre-oxygenation with 100% oxygen Induction Type: IV induction Ventilation: Mask ventilation without difficulty Laryngoscope Size: 3 and McGraph Grade View: Grade II Tube type: Oral Tube size: 7.0 mm Number of attempts: 1 Airway Equipment and Method: Stylet Placement Confirmation: ETT inserted through vocal cords under direct vision,  positive ETCO2 and breath sounds checked- equal and bilateral Secured at: 21 cm Tube secured with: Tape Dental Injury: Teeth and Oropharynx as per pre-operative assessment  Difficulty Due To: Difficulty was anticipated and Difficult Airway- due to anterior larynx Future Recommendations: Recommend- induction with short-acting agent, and alternative techniques readily available Comments: Pt initially with LMA.Very reactive airway with copious saliva and coughing. Pt started to obstruct. Removed LMA and intubated

## 2017-12-31 NOTE — Anesthesia Post-op Follow-up Note (Signed)
Anesthesia QCDR form completed.        

## 2017-12-31 NOTE — Op Note (Signed)
Preoperative diagnosis: Suspected metastatic lung cancer.  Postoperative diagnosis: Same.  Operative procedure: 1) open biopsy right supraclavicular node.  2) left subclavian PowerPort placement.  Operating Surgeon: Hervey Ard, MD.  Anesthesia: General endotracheal, Marcaine 0.5% with 1 to 200,000 units of epinephrine, 7 cc; Xylocaine, 1% plain, 10 cc local.  Estimated blood loss: 10 cc.  Clinical note this 67 year old woman was recently noted with a lung mass and PET scan showed the palpable right supraclavicular lymph node to be FDG avid.  Biopsy is been requested for pathologic diagnosis.  Anticipating chemotherapy PowerPort placement has been requested.  Operative note: The patient received Kefzol 2 g intravenously during the procedure, prior to PowerPort placement.  The neck and chest was prepped with ChloraPrep and draped.  The right supraclavicular node was approached first through a transverse incision about 1 fingerbreadth above the clavicle.  The skin was incised sharply and the remaining dissection completed with electrocautery.  The deep cervical fascia was opened.  The clavicular head of the sternocleidomastoid was retracted medially.  The enlarged node was  modest inflammatory changes.  This was gently dissected from adjacent tissue.  About 90% of the node was removed.  There appeared to be a significant component of necrosis as when it was grasped of the node shattered.  The base of the node was oversewn with a 3-0 Vicryl figure-of-eight suture for hemostasis.  The node was sent fresh to pathology.  The deep cervical fascia was closed with a running 3-0 Vicryl.  The platysmal layer was approximated in similar fashion.  The skin was closed with a running 4-0 Vicryl subarticular suture.  Benzoin, Steri-Strips followed by Telfa and Tegaderm dressing applied.  Attention was turned towards placement of the PowerPort.  Ultrasound was used to confirm patency of the left subclavian  vein.  Under ultrasound guidance the vein was cannulated followed by guidewire passage.  The dilator was passed and the catheter tip  was positioned at the junction of the SVC and right atrium.  It was tunneled to a pocket on the left anterior chest wall.  The port was anchored to the deep tissue with 3-0 Prolene sutures x2.  The subcutaneous layer was approximated with a running 3-0 Vicryl suture and the skin closed with a running 4-0 Vicryl subcuticular suture.  Benzoin, Steri-Strips, Telfa and Tegaderm dressings were applied.  The patient was taken to the recovery room in stable condition.  Erect portable chest x-ray obtained in the recovery room showed the catheter tip as described above and no evidence of pneumothorax.

## 2018-01-03 ENCOUNTER — Encounter: Payer: Self-pay | Admitting: General Surgery

## 2018-01-04 ENCOUNTER — Ambulatory Visit
Admission: RE | Admit: 2018-01-04 | Discharge: 2018-01-04 | Disposition: A | Discharge status: Home / Self Care | Payer: Medicare Other | Source: Ambulatory Visit | Attending: Radiation Oncology | Admitting: Radiation Oncology

## 2018-01-04 DIAGNOSIS — C3491 Malignant neoplasm of unspecified part of right bronchus or lung: Secondary | ICD-10-CM | POA: Insufficient documentation

## 2018-01-04 DIAGNOSIS — I2510 Atherosclerotic heart disease of native coronary artery without angina pectoris: Secondary | ICD-10-CM | POA: Insufficient documentation

## 2018-01-04 DIAGNOSIS — I10 Essential (primary) hypertension: Secondary | ICD-10-CM | POA: Diagnosis not present

## 2018-01-04 DIAGNOSIS — Z7982 Long term (current) use of aspirin: Secondary | ICD-10-CM | POA: Insufficient documentation

## 2018-01-04 DIAGNOSIS — Z7984 Long term (current) use of oral hypoglycemic drugs: Secondary | ICD-10-CM | POA: Diagnosis not present

## 2018-01-04 DIAGNOSIS — Z7952 Long term (current) use of systemic steroids: Secondary | ICD-10-CM | POA: Diagnosis not present

## 2018-01-04 DIAGNOSIS — E785 Hyperlipidemia, unspecified: Secondary | ICD-10-CM | POA: Insufficient documentation

## 2018-01-04 DIAGNOSIS — Z823 Family history of stroke: Secondary | ICD-10-CM | POA: Insufficient documentation

## 2018-01-04 DIAGNOSIS — I779 Disorder of arteries and arterioles, unspecified: Secondary | ICD-10-CM | POA: Insufficient documentation

## 2018-01-04 DIAGNOSIS — Z510 Encounter for antineoplastic radiation therapy: Secondary | ICD-10-CM | POA: Insufficient documentation

## 2018-01-04 DIAGNOSIS — Z8249 Family history of ischemic heart disease and other diseases of the circulatory system: Secondary | ICD-10-CM | POA: Insufficient documentation

## 2018-01-04 DIAGNOSIS — Z833 Family history of diabetes mellitus: Secondary | ICD-10-CM | POA: Diagnosis not present

## 2018-01-04 DIAGNOSIS — Z79899 Other long term (current) drug therapy: Secondary | ICD-10-CM | POA: Insufficient documentation

## 2018-01-04 DIAGNOSIS — Z803 Family history of malignant neoplasm of breast: Secondary | ICD-10-CM | POA: Diagnosis not present

## 2018-01-04 DIAGNOSIS — E119 Type 2 diabetes mellitus without complications: Secondary | ICD-10-CM | POA: Diagnosis not present

## 2018-01-04 DIAGNOSIS — Z951 Presence of aortocoronary bypass graft: Secondary | ICD-10-CM | POA: Diagnosis not present

## 2018-01-05 ENCOUNTER — Ambulatory Visit (INDEPENDENT_AMBULATORY_CARE_PROVIDER_SITE_OTHER): Payer: Medicare Other

## 2018-01-05 DIAGNOSIS — C3481 Malignant neoplasm of overlapping sites of right bronchus and lung: Secondary | ICD-10-CM

## 2018-01-05 NOTE — Progress Notes (Signed)
Patient ID: Dana Ramirez, female   DOB: 1951/03/09, 67 y.o.   MRN: [id number] Patient came in today for a wound check. Post op supraclavicular node biopsy and port placement. Tegaderm dressings removed, steri strips in place. The wound is clean, with no signs of infection noted. Follow up as needed.

## 2018-01-06 ENCOUNTER — Inpatient Hospital Stay: Payer: Medicare Other | Admitting: Oncology

## 2018-01-06 ENCOUNTER — Other Ambulatory Visit: Payer: Self-pay | Admitting: Oncology

## 2018-01-06 ENCOUNTER — Other Ambulatory Visit: Payer: Self-pay | Admitting: *Deleted

## 2018-01-06 DIAGNOSIS — R110 Nausea: Secondary | ICD-10-CM

## 2018-01-06 DIAGNOSIS — C3491 Malignant neoplasm of unspecified part of right bronchus or lung: Secondary | ICD-10-CM

## 2018-01-06 DIAGNOSIS — Z510 Encounter for antineoplastic radiation therapy: Secondary | ICD-10-CM | POA: Diagnosis not present

## 2018-01-06 DIAGNOSIS — R112 Nausea with vomiting, unspecified: Secondary | ICD-10-CM

## 2018-01-06 DIAGNOSIS — C3490 Malignant neoplasm of unspecified part of unspecified bronchus or lung: Secondary | ICD-10-CM | POA: Insufficient documentation

## 2018-01-06 MED ORDER — LIDOCAINE-PRILOCAINE 2.5-2.5 % EX CREA
TOPICAL_CREAM | CUTANEOUS | 3 refills | Status: DC
Start: 2018-01-06 — End: 2018-03-03

## 2018-01-06 MED ORDER — ONDANSETRON HCL 8 MG PO TABS: 8.0000 mg | ORAL_TABLET | Freq: Two times a day (BID) | ORAL | 1 refills | Status: DC | PRN

## 2018-01-06 MED ORDER — PREDNISONE 50 MG PO TABS: 50.0000 mg | ORAL_TABLET | Freq: Every day | ORAL | 0 refills | Status: DC

## 2018-01-06 MED ORDER — ONDANSETRON HCL 8 MG PO TABS: 8.0000 mg | ORAL_TABLET | Freq: Three times a day (TID) | ORAL | 1 refills | Status: DC | PRN

## 2018-01-06 MED ORDER — PROCHLORPERAZINE MALEATE 10 MG PO TABS: 10.0000 mg | ORAL_TABLET | Freq: Four times a day (QID) | ORAL | 1 refills | Status: DC | PRN

## 2018-01-06 NOTE — Patient Instructions (Signed)

## 2018-01-06 NOTE — Anesthesia Postprocedure Evaluation (Signed)
Anesthesia Post Note  Patient: Dana Ramirez  Procedure(s) Performed: INSERTION PORT-A-CATH (Left ) SUPRACLAVICULAR NODE BIOPSY (Right )  Patient location during evaluation: PACU Anesthesia Type: General Level of consciousness: awake and alert Pain management: pain level controlled Vital Signs Assessment: post-procedure vital signs reviewed and stable Respiratory status: spontaneous breathing, nonlabored ventilation, respiratory function stable and patient connected to nasal cannula oxygen Cardiovascular status: blood pressure returned to baseline and stable Postop Assessment: no apparent nausea or vomiting Anesthetic complications: no     Last Vitals:  Vitals:   12/31/17 1700 12/31/17 1723  BP: 122/85 134/65  Pulse: 82 78  Resp: 16 16  Temp: 36.4 C (!) 35.9 C  SpO2: 99% 98%    Last Pain:  Vitals:   01/03/18 0833  TempSrc:   PainSc: 1                  Molli Barrows

## 2018-01-06 NOTE — Progress Notes (Signed)
DISCONTINUE ON PATHWAY REGIMEN - Non-Small Cell Lung     A cycle is every 21 days:     Paclitaxel      Carboplatin   **Always confirm dose/schedule in your pharmacy ordering system**  REASON: Other Reason PRIOR TREATMENT: LOS370: Carboplatin AUC=6 + Paclitaxel 200 mg/m2 q21 Days x 2 Cycles TREATMENT RESPONSE: Unable to Evaluate  START ON PATHWAY REGIMEN - Non-Small Cell Lung     Administer weekly:     Paclitaxel      Carboplatin   **Always confirm dose/schedule in your pharmacy ordering system**  Patient Characteristics: Stage III - Unresectable, PS = 0, 1 AJCC T Category: TX Current Disease Status: No Distant Mets or Local Recurrence AJCC N Category: N3 AJCC M Category: M0 AJCC 8 Stage Grouping: Unknown Performance Status: PS = 0, 1 Intent of Therapy: Curative Intent, Discussed with Patient

## 2018-01-06 NOTE — Anesthesia Preprocedure Evaluation (Signed)
Anesthesia Evaluation  Patient identified by MRN, date of birth, ID band Patient awake    Reviewed: Allergy & Precautions, H&P , NPO status , Patient's Chart, lab work & pertinent test results, reviewed documented beta blocker date and time   Airway Mallampati: II   Neck ROM: full    Dental  (+) Poor Dentition   Pulmonary neg pulmonary ROS,    Pulmonary exam normal        Cardiovascular Exercise Tolerance: Poor hypertension, On Medications + angina with exertion + CAD and + Peripheral Vascular Disease  negative cardio ROS Normal cardiovascular exam Rhythm:regular Rate:Normal     Neuro/Psych  Neuromuscular disease negative neurological ROS  negative psych ROS   GI/Hepatic negative GI ROS, Neg liver ROS, GERD  Medicated,  Endo/Other  negative endocrine ROSdiabetesHypothyroidism   Renal/GU Renal diseasenegative Renal ROS  negative genitourinary   Musculoskeletal   Abdominal   Peds  Hematology negative hematology ROS (+)   Anesthesia Other Findings Past Medical History: No date: CAD (coronary artery disease) No date: Carotid arterial disease ([facility]) No date: Diabetes mellitus without complication ([facility]) No date: History of vertigo No date: Hyperlipidemia No date: Hypertension No date: Kidney stone Past Surgical History: 1980s: BREAST CYST ASPIRATION; Left No date: CESAREAN SECTION     Comment:  x 2 No date: CHOLECYSTECTOMY 03/2005: CORONARY ARTERY BYPASS GRAFT     Comment:  LIMA to the LAD, vein graft to diagonal with a negative               stress test in 06/2006 12/31/2017: PORTACATH PLACEMENT; Left     Comment:  Procedure: INSERTION PORT-A-CATH;  Surgeon: Robert Bellow, MD;  Location: [facility] ORS;  Service: General;                Laterality: Left; 12/31/2017: SENTINEL NODE BIOPSY; Right     Comment:  Procedure: SUPRACLAVICULAR NODE BIOPSY;  Surgeon:               Robert Bellow, MD;   Location: [facility] ORS;  Service:               General;  Laterality: Right; No date: TONSILLECTOMY BMI    Body Mass Index:  26.75 kg/m     Reproductive/Obstetrics negative OB ROS                             Anesthesia Physical Anesthesia Plan  ASA: III  Anesthesia Plan: General   Post-op Pain Management:    Induction:   PONV Risk Score and Plan:   Airway Management Planned:   Additional Equipment:   Intra-op Plan:   Post-operative Plan:   Informed Consent: I have reviewed the patients History and Physical, chart, labs and discussed the procedure including the risks, benefits and alternatives for the proposed anesthesia with the patient or authorized representative who has indicated his/her understanding and acceptance.   Dental Advisory Given  Plan Discussed with: CRNA  Anesthesia Plan Comments:         Anesthesia Quick Evaluation

## 2018-01-06 NOTE — Progress Notes (Signed)
START ON PATHWAY REGIMEN - Non-Small Cell Lung     A cycle is every 21 days:     Paclitaxel      Carboplatin   **Always confirm dose/schedule in your pharmacy ordering system**  Patient Characteristics: Stage III - Unresectable, PS = 0, 1 AJCC T Category: TX Current Disease Status: No Distant Mets or Local Recurrence AJCC N Category: N3 AJCC M Category: M0 AJCC 8 Stage Grouping: Unknown Performance Status: PS = 0, 1 Intent of Therapy: Curative Intent, Discussed with Patient

## 2018-01-07 ENCOUNTER — Other Ambulatory Visit: Payer: Self-pay

## 2018-01-07 ENCOUNTER — Inpatient Hospital Stay: Payer: Medicare Other

## 2018-01-07 ENCOUNTER — Telehealth: Payer: Self-pay | Admitting: General Surgery

## 2018-01-07 ENCOUNTER — Encounter: Payer: Self-pay | Admitting: Oncology

## 2018-01-07 ENCOUNTER — Inpatient Hospital Stay: Payer: Medicare Other | Admitting: Oncology

## 2018-01-07 VITALS — BP 131/75 | HR 94 | Temp 97.2°F | Resp 18 | Wt 150.5 lb

## 2018-01-07 DIAGNOSIS — C3491 Malignant neoplasm of unspecified part of right bronchus or lung: Secondary | ICD-10-CM

## 2018-01-07 DIAGNOSIS — R062 Wheezing: Secondary | ICD-10-CM | POA: Diagnosis not present

## 2018-01-07 DIAGNOSIS — Z7189 Other specified counseling: Secondary | ICD-10-CM

## 2018-01-07 DIAGNOSIS — F419 Anxiety disorder, unspecified: Secondary | ICD-10-CM

## 2018-01-07 DIAGNOSIS — R0602 Shortness of breath: Secondary | ICD-10-CM

## 2018-01-07 DIAGNOSIS — R05 Cough: Secondary | ICD-10-CM

## 2018-01-07 DIAGNOSIS — R590 Localized enlarged lymph nodes: Secondary | ICD-10-CM | POA: Diagnosis not present

## 2018-01-07 DIAGNOSIS — Z5111 Encounter for antineoplastic chemotherapy: Secondary | ICD-10-CM | POA: Diagnosis not present

## 2018-01-07 MED ORDER — LORAZEPAM 0.5 MG PO TABS: 0.5000 mg | ORAL_TABLET | Freq: Three times a day (TID) | ORAL | 0 refills | Status: DC | PRN

## 2018-01-07 NOTE — Telephone Encounter (Signed)
The patient was notified that pathology is still working on the tissue sample and we do not have a definitive diagnosis at this time.  She reports that she is appreciate some swelling at the node biopsy site without associated pain or redness.  She is been encouraged to use local heat for comfort.  She will be notified when pathology results are available.

## 2018-01-07 NOTE — Progress Notes (Signed)
Patient here for follow up. Pt complains that cough is getting worse.

## 2018-01-07 NOTE — Progress Notes (Signed)
Hematology/Oncology Consult note [facility] [phone] [phone]   Patient Care Team: Crecencio Mc, MD as PCP - General (Internal Medicine) Minna Merritts, MD (Cardiology) Telford Nab, RN as Registered Nurse Bary Castilla, Forest Gleason, MD (General Surgery)  REFERRING PROVIDER: Crecencio Mc, MD CHIEF COMPLAINTS/PURPOSE OF CONSULTATION:  Evaluation of Neck mass  HISTORY OF PRESENTING ILLNESS:  Dana Ramirez is a  67 y.o.  female with PMH listed below who was referred to me for evaluation of neck mass. Patient reports having cough for about 2 to 3 months.  Started after a mild Upper respiratory infection, and becomes chronic.  Associate with throat discomfort.   Denies any hemoptysis, yellowish or greenish sputum. Nothing make cough better or worse.  She also noticed right neck lump for couple of weeks, the neck lump is tender.  Associated with right side neck,  and right side headache.  Denies any vision changes.Her appetite has decreased for the past couple of months and lost  a few pounds. Denies any night sweating, fever or chills. She is a never smoker, husband  and used to smoke at home. Denies any known family history of cancer.  Data Reviewed: 12/20/2017 CT neck soft tissue with contrast showed palpable abnormality represent a necrotic right supraclavicular lymph node measuring 2.2 cm in diameter.  This is the tip of the iceberg as there is massive necrotic lymphadenopathy in the right hilum and mediastinum, starting to be malignant.  Presumed lung primary is not demonstrated in the upper half of the chest.  INTERVAL HISTORY Dana Ramirez is a 67 y.o. female who has above history reviewed by me today presents for follow up visit for management of newly discovered lung cancer. Status post excisional biopsy of right supraclavicular lymph node.  #Shortness of breath and wheezing: Started on prednisone yesterday.  Reports  that breathing remains the same.  Not better or worse. Continue to have fatigue, slightly worse. Anxiety, worse.  Review of Systems  Constitutional: Positive for malaise/fatigue and weight loss. Negative for chills and fever.  HENT: Negative for congestion, ear discharge, ear pain, nosebleeds, sinus pain and sore throat.   Eyes: Negative for double vision, photophobia, pain, discharge and redness.  Respiratory: Positive for cough and wheezing. Negative for hemoptysis, sputum production and shortness of breath.   Cardiovascular: Negative for chest pain, palpitations, orthopnea, claudication and leg swelling.  Gastrointestinal: Negative for abdominal pain, blood in stool, constipation, diarrhea, heartburn, melena, nausea and vomiting.  Genitourinary: Negative for dysuria, flank pain, frequency and hematuria.  Musculoskeletal: Negative for back pain, myalgias and neck pain.  Skin: Negative for itching and rash.  Neurological: Negative for dizziness, tingling, tremors, focal weakness, weakness and headaches.  Endo/Heme/Allergies: Negative for environmental allergies. Does not bruise/bleed easily.  Psychiatric/Behavioral: Negative for depression and hallucinations. The patient is not nervous/anxious.     MEDICAL HISTORY:  Past Medical History:  Diagnosis Date  . CAD (coronary artery disease)   . Carotid arterial disease ([place])   . Diabetes mellitus without complication ([place])   . History of vertigo   . Hyperlipidemia   . Hypertension   . Kidney stone     SURGICAL HISTORY: Past Surgical History:  Procedure Laterality Date  . BREAST CYST ASPIRATION Left 1980s  . CESAREAN SECTION     x 2  . CHOLECYSTECTOMY    . CORONARY ARTERY BYPASS GRAFT  03/2005   LIMA to the LAD, vein graft to diagonal with a negative stress test in  06/2006  . PORTACATH PLACEMENT Left 12/31/2017   Procedure: INSERTION PORT-A-CATH;  Surgeon: Robert Bellow, MD;  Location: [facility] ORS;  Service: General;  Laterality:  Left;  . SENTINEL NODE BIOPSY Right 12/31/2017   Procedure: SUPRACLAVICULAR NODE BIOPSY;  Surgeon: Robert Bellow, MD;  Location: [facility] ORS;  Service: General;  Laterality: Right;  . TONSILLECTOMY      SOCIAL HISTORY: Social History   Socioeconomic History  . Marital status: Married    Spouse name: Elenore Rota   . Number of children: 2  . Years of education: Not on file  . Highest education level: Not on file  Occupational History  . Occupation: Retired     Fish farm manager: OTHER    Comment: [employer]  Social Needs  . Financial resource strain: Not on file  . Food insecurity:    Worry: Not on file    Inability: Not on file  . Transportation needs:    Medical: Not on file    Non-medical: Not on file  Tobacco Use  . Smoking status: Never Smoker  . Smokeless tobacco: Never Used  Substance and Sexual Activity  . Alcohol use: No  . Drug use: No  . Sexual activity: Not on file  Lifestyle  . Physical activity:    Days per week: Not on file    Minutes per session: Not on file  . Stress: Not on file  Relationships  . Social connections:    Talks on phone: Not on file    Gets together: Not on file    Attends religious service: Not on file    Active member of club or organization: Not on file    Attends meetings of clubs or organizations: Not on file    Relationship status: Not on file  . Intimate partner violence:    Fear of current or ex partner: Not on file    Emotionally abused: Not on file    Physically abused: Not on file    Forced sexual activity: Not on file  Other Topics Concern  . Not on file  Social History Narrative   Married   Gets regular exercise, once or twice every week    FAMILY HISTORY: Family History  Problem Relation Age of Onset  . Heart disease Mother   . Heart disease Father 61  . Stroke Sister 58  . Heart disease Sister   . Diabetes Brother   . Coronary artery disease Other        Strong family history of CAD and CABG  . Heart disease Maternal  Grandmother   . Breast cancer Maternal Grandmother   . Heart disease Maternal Grandfather   . Heart disease Paternal Grandmother   . Breast cancer Paternal Grandmother 61  . Heart disease Paternal Grandfather     ALLERGIES:  is allergic to codeine; lisinopril; and penicillins.  MEDICATIONS:  Current Outpatient Medications  Medication Sig Dispense Refill  . acetaminophen (TYLENOL) 650 MG CR tablet Take 650 mg by mouth every 8 (eight) hours as needed for pain.    Marland Kitchen albuterol (PROVENTIL HFA;VENTOLIN HFA) 108 (90 Base) MCG/ACT inhaler Inhale 2 puffs into the lungs every 6 (six) hours as needed for wheezing or shortness of breath. 1 Inhaler 2  . aspirin (ASPIR-LOW) 81 MG EC tablet Take 162 mg by mouth daily.      . Cholecalciferol (VITAMIN D3) 1000 units CAPS Take 1 capsule by mouth daily.    . cyanocobalamin 1000 MCG tablet Take 1,000 mcg by mouth daily.    Marland Kitchen  glipiZIDE (GLUCOTROL XL) 2.5 MG 24 hr tablet TAKE ONE TABLET BY MOUTH EVERY MORNING WITH BREAKFAST 90 tablet 1  . JANUVIA 100 MG tablet TAKE ONE TABLET BY MOUTH DAILY 90 tablet 1  . lidocaine-prilocaine (EMLA) cream Apply to affected area once 30 g 3  . metFORMIN (GLUCOPHAGE) 1000 MG tablet TAKE ONE TABLET BY MOUTH DAILY 90 tablet 1  . metoprolol succinate (TOPROL-XL) 25 MG 24 hr tablet TAKE 1 TABLET DAILY 90 tablet 3  . omeprazole (PRILOSEC OTC) 20 MG tablet Take 20 mg by mouth daily.    . ondansetron (ZOFRAN) 8 MG tablet Take 1 tablet (8 mg total) by mouth every 8 (eight) hours as needed for nausea or vomiting. 30 tablet 1  . ondansetron (ZOFRAN) 8 MG tablet Take 1 tablet (8 mg total) by mouth 2 (two) times daily as needed for refractory nausea / vomiting. Start on day 3 after chemo. 30 tablet 1  . predniSONE (DELTASONE) 50 MG tablet Take 1 tablet (50 mg total) by mouth daily. 14 tablet 0  . prochlorperazine (COMPAZINE) 10 MG tablet Take 1 tablet (10 mg total) by mouth every 6 (six) hours as needed (Nausea or vomiting). 30 tablet 1  .  rosuvastatin (CRESTOR) 20 MG tablet TAKE ONE TABLET BY MOUTH DAILY 90 tablet 1  . traMADol (ULTRAM) 50 MG tablet Take 1 tablet (50 mg total) by mouth every 6 (six) hours as needed. (Patient taking differently: Take 50 mg by mouth every 6 (six) hours as needed for moderate pain. ) 30 tablet 0  . benzonatate (TESSALON) 200 MG capsule Take 1 capsule (200 mg total) by mouth 3 (three) times daily as needed for cough. (Patient not taking: Reported on 01/07/2018) 60 capsule 1  . cyclobenzaprine (FLEXERIL) 10 MG tablet Take 1 tablet (10 mg total) by mouth 3 (three) times daily as needed for muscle spasms. (Patient not taking: Reported on 01/07/2018) 30 tablet 0  . LORazepam (ATIVAN) 0.5 MG tablet Take 1 tablet (0.5 mg total) by mouth every 8 (eight) hours as needed for anxiety (nausea). 30 tablet 0   No current facility-administered medications for this visit.      PHYSICAL EXAMINATION: ECOG PERFORMANCE STATUS: 1 - Symptomatic but completely ambulatory Vitals:   01/07/18 1330 01/07/18 1346  BP: 131/75   Pulse: 94   Resp: 18   Temp: (!) 97.2 F (36.2 C)   SpO2:  91%   Filed Weights   01/07/18 1330  Weight: 150 lb 8 oz (68.3 kg)    Physical Exam  Constitutional: She is oriented to person, place, and time. She appears well-developed and well-nourished. No distress.  HENT:  Head: Normocephalic and atraumatic.  Right Ear: External ear normal.  Left Ear: External ear normal.  Mouth/Throat: Oropharynx is clear and moist.  Eyes: Pupils are equal, round, and reactive to light. Conjunctivae and EOM are normal. No scleral icterus.  Neck: Normal range of motion. Neck supple.  Right supra clavicular lymphadenopathy,Status post excisional biopsy.  Cardiovascular: Normal rate, regular rhythm and normal heart sounds.  Pulmonary/Chest: Effort normal. No respiratory distress. She has wheezes. She has no rales. She exhibits no tenderness.  Abdominal: Soft. Bowel sounds are normal. She exhibits no distension  and no mass. There is no tenderness.  Musculoskeletal: Normal range of motion. She exhibits no edema or deformity.  Neurological: She is alert and oriented to person, place, and time. No cranial nerve deficit. Coordination normal.  Skin: Skin is warm and dry. No rash noted.  Psychiatric:  She has a normal mood and affect. Her behavior is normal. Thought content normal.     LABORATORY DATA:  I have reviewed the data as listed Lab Results  Component Value Date   WBC 9.5 12/21/2017   HGB 12.9 12/21/2017   HCT 37.5 12/21/2017   MCV 90.9 12/21/2017   PLT 213 12/21/2017   Recent Labs    09/06/17 0909 11/04/17 1423 12/20/17 1056 12/21/17 0922  NA 139 137  --  139  K 4.4 4.2  --  4.6  CL 104 103  --  102  CO2 28 26  --  26  GLUCOSE 125* 210*  --  144*  BUN 21 20  --  16  CREATININE 0.73 0.70 0.70 0.69  CALCIUM 9.3 9.6  --  10.0  GFRNONAA  --   --   --  >60  GFRAA  --   --   --  >60  PROT 7.4 7.3  --  7.9  ALBUMIN 4.3 4.1  --  3.9  AST 15 15  --  16  ALT 11 12  --  11*  ALKPHOS 75 82  --  93  BILITOT 0.4 0.2  --  0.3       ASSESSMENT & PLAN:  1. Malignant neoplasm of right lung, unspecified part of lung ([place])   2. Supraclavicular lymphadenopathy   3. Mediastinal adenopathy   4. SOB (shortness of breath)   5. Wheezing   6. Anxiety   7. Goals of care, counseling/discussion    #Non-small cell lung cancer, cTx N3M0, Stage III: massive necrotizing neck/mediastinum/hilum lymphadenopathy. Patient's case were discussed on tumor board on 01/06/2018.  Per pathology, this is most consistent with non-small cell lung cancer, poorly differentiated..  I called Dr.Onley today and she confirmed that additional IHC stains all came back negative for lymphoma. Pathology was discussed with patient and her family members.  I discussed the cancer diagnosis, extent of disease, prognosis.  I recommend concurrent chemoradiation followed by consolidation with immunotherapy. Discussed with RadOnc  Dr.Chrystal.  Patient will start simulation next week and radiation on 01/12/2018. Plan concurrent chemoradiation with carbo and taxol.Plan also send tissue of origin testing and NGS.   # chemo discussion: I explained to the patient the risks and benefits of chemotherapy including all but not limited to hair loss, mouth sore, nausea, vomiting, low blood counts, bleeding, and risk of life threatening infection and even death, secondary malignancy etc.  Risk of neuropathy is associated with Taxol Patient voices understanding and willing to proceed chemotherapy.  # Goal of Care was discussed with patient.  Curative intent.   #chemotherapy education; Mediport has been placed by surgery.. Antiemetics-Zofran and Compazine; EMLA cream sent to pharmacy  # Wheezing: Due to lung neoplasm.  Continue prednisone 50 mg daily for now.  Can be tapered down once her respiratory symptoms improved. # Anxiety: Ativan 0.5mg  Q8h as needed for nausea or anxiety. .    .order All questions were answered to her satisfaction.  The patient knows to call the clinic with any problems questions or concerns.  Return of visit: 01/12/2018 for assessment by nurse practitioner prior to day 1 of chemotherapy Total face to face encounter time for this patient visit was 40 min. >50% of the time was  spent in counseling and coordination of care.   Earlie Server, MD, PhD Hematology Oncology [facility] Pager- [phone] 01/07/2018

## 2018-01-10 ENCOUNTER — Other Ambulatory Visit: Payer: Self-pay | Admitting: Oncology

## 2018-01-10 LAB — SURGICAL PATHOLOGY

## 2018-01-10 NOTE — Progress Notes (Signed)
[facility]  [phone] Fax:(336) [phone]  ID: Dana Ramirez OB: 1951-03-22  MR#: [id number]  ZJQ#:[id number]  Patient Care Team: Crecencio Mc, MD as PCP - General (Internal Medicine) Rockey Situ, Kathlene November, MD (Cardiology) Telford Nab, RN as Registered Nurse Bary Castilla, Forest Gleason, MD (General Surgery)  CHIEF COMPLAINT: Right lung cancer  INTERVAL HISTORY: Patient is a 67 year old female who initially presented with a supraclavicular mass that upon biopsy was found to be stage III lung cancer with her index lesion in her right lung.  She is in clinic today to initiate cycle 1 of weekly carboplatin and Taxol along with her daily XRT.  She currently feels well and at her baseline.  She has no neurologic complaints.  She denies any recent fevers or illnesses.  She has a good appetite and denies weight loss.  She has no chest pain or shortness of breath.  She denies any nausea, vomiting, constipation, or diarrhea.  She has no urinary complaints.  Patient offers no specific complaints today.  REVIEW OF SYSTEMS:   Review of Systems  Constitutional: Negative.  Negative for fever, malaise/fatigue and weight loss.  Respiratory: Negative.  Negative for cough and shortness of breath.   Cardiovascular: Negative.  Negative for chest pain and leg swelling.  Gastrointestinal: Negative.  Negative for abdominal pain.  Genitourinary: Negative.  Negative for dysuria.  Musculoskeletal: Negative.  Negative for back pain.  Skin: Negative.  Negative for rash.  Neurological: Negative.  Negative for sensory change, focal weakness and weakness.  Psychiatric/Behavioral: Negative.  The patient is not nervous/anxious.     As per HPI. Otherwise, a complete review of systems is negative.  PAST MEDICAL HISTORY: Past Medical History:  Diagnosis Date  . CAD (coronary artery disease)   . Carotid arterial disease ([place])   . Diabetes mellitus without complication ([place])   . History  of vertigo   . Hyperlipidemia   . Hypertension   . Kidney stone     PAST SURGICAL HISTORY: Past Surgical History:  Procedure Laterality Date  . BREAST CYST ASPIRATION Left 1980s  . CESAREAN SECTION     x 2  . CHOLECYSTECTOMY    . CORONARY ARTERY BYPASS GRAFT  03/2005   LIMA to the LAD, vein graft to diagonal with a negative stress test in 06/2006  . PORTACATH PLACEMENT Left 12/31/2017   Procedure: INSERTION PORT-A-CATH;  Surgeon: Robert Bellow, MD;  Location: [facility] ORS;  Service: General;  Laterality: Left;  . SENTINEL NODE BIOPSY Right 12/31/2017   Procedure: SUPRACLAVICULAR NODE BIOPSY;  Surgeon: Robert Bellow, MD;  Location: [facility] ORS;  Service: General;  Laterality: Right;  . TONSILLECTOMY      FAMILY HISTORY: Family History  Problem Relation Age of Onset  . Heart disease Mother   . Heart disease Father 76  . Stroke Sister 36  . Heart disease Sister   . Diabetes Brother   . Coronary artery disease Other        Strong family history of CAD and CABG  . Heart disease Maternal Grandmother   . Breast cancer Maternal Grandmother   . Heart disease Maternal Grandfather   . Heart disease Paternal Grandmother   . Breast cancer Paternal Grandmother 78  . Heart disease Paternal Grandfather     ADVANCED DIRECTIVES (Y/N):  N  HEALTH MAINTENANCE: Social History   Tobacco Use  . Smoking status: Never Smoker  . Smokeless tobacco: Never Used  Substance Use Topics  . Alcohol use: No  .  Drug use: No     Colonoscopy:  PAP:  Bone density:  Lipid panel:  Allergies  Allergen Reactions  . Codeine Nausea And Vomiting  . Lisinopril Cough  . Penicillins Itching    Has patient had a PCN reaction causing immediate rash, facial/tongue/throat swelling, SOB or lightheadedness with hypotension: no Has patient had a PCN reaction causing severe rash involving mucus membranes or skin necrosis: no Has patient had a PCN reaction that required hospitalization: no Has patient had a  PCN reaction occurring within the last 10 years: no If all of the above answers are "NO", then may proceed with Cephalosporin use.     Current Outpatient Medications  Medication Sig Dispense Refill  . acetaminophen (TYLENOL) 650 MG CR tablet Take 650 mg by mouth every 8 (eight) hours as needed for pain.    Marland Kitchen albuterol (PROVENTIL HFA;VENTOLIN HFA) 108 (90 Base) MCG/ACT inhaler Inhale 2 puffs into the lungs every 6 (six) hours as needed for wheezing or shortness of breath. 1 Inhaler 2  . aspirin (ASPIR-LOW) 81 MG EC tablet Take 162 mg by mouth daily.      . Cholecalciferol (VITAMIN D3) 1000 units CAPS Take 1 capsule by mouth daily.    . cyanocobalamin 1000 MCG tablet Take 1,000 mcg by mouth daily.    . cyclobenzaprine (FLEXERIL) 10 MG tablet Take 1 tablet (10 mg total) by mouth 3 (three) times daily as needed for muscle spasms. 30 tablet 0  . glipiZIDE (GLUCOTROL XL) 2.5 MG 24 hr tablet TAKE ONE TABLET BY MOUTH EVERY MORNING WITH BREAKFAST 90 tablet 1  . JANUVIA 100 MG tablet TAKE ONE TABLET BY MOUTH DAILY 90 tablet 1  . lidocaine-prilocaine (EMLA) cream Apply to affected area once 30 g 3  . LORazepam (ATIVAN) 0.5 MG tablet Take 1 tablet (0.5 mg total) by mouth every 8 (eight) hours as needed for anxiety (nausea). 30 tablet 0  . metFORMIN (GLUCOPHAGE) 1000 MG tablet TAKE ONE TABLET BY MOUTH DAILY 90 tablet 1  . metoprolol succinate (TOPROL-XL) 25 MG 24 hr tablet TAKE 1 TABLET DAILY 90 tablet 3  . omeprazole (PRILOSEC OTC) 20 MG tablet Take 20 mg by mouth daily.    . ondansetron (ZOFRAN) 8 MG tablet Take 1 tablet (8 mg total) by mouth every 8 (eight) hours as needed for nausea or vomiting. 30 tablet 1  . ondansetron (ZOFRAN) 8 MG tablet Take 1 tablet (8 mg total) by mouth 2 (two) times daily as needed for refractory nausea / vomiting. Start on day 3 after chemo. 30 tablet 1  . predniSONE (DELTASONE) 50 MG tablet Take 1 tablet (50 mg total) by mouth daily. 14 tablet 0  . prochlorperazine  (COMPAZINE) 10 MG tablet Take 1 tablet (10 mg total) by mouth every 6 (six) hours as needed (Nausea or vomiting). 30 tablet 1  . rosuvastatin (CRESTOR) 20 MG tablet TAKE ONE TABLET BY MOUTH DAILY 90 tablet 1  . traMADol (ULTRAM) 50 MG tablet Take 1 tablet (50 mg total) by mouth every 6 (six) hours as needed. (Patient taking differently: Take 50 mg by mouth every 6 (six) hours as needed for moderate pain. ) 30 tablet 0   No current facility-administered medications for this visit.     OBJECTIVE: Vitals:   01/12/18 0943  BP: 132/84  Pulse: 78  Resp: 18  Temp: (!) 97 F (36.1 C)     Body mass index is 26.55 kg/m.    ECOG FS:0 - Asymptomatic  General: Well-developed,  well-nourished, no acute distress. Eyes: Pink conjunctiva, anicteric sclera. Lungs: Clear to auscultation bilaterally. Heart: Regular rate and rhythm. No rubs, murmurs, or gallops. Abdomen: Soft, nontender, nondistended. No organomegaly noted, normoactive bowel sounds. Musculoskeletal: No edema, cyanosis, or clubbing. Neuro: Alert, answering all questions appropriately. Cranial nerves grossly intact. Skin: No rashes or petechiae noted. Psych: Normal affect.  LAB RESULTS:  Lab Results  Component Value Date   NA 137 01/12/2018   K 4.0 01/12/2018   CL 99 01/12/2018   CO2 27 01/12/2018   GLUCOSE 127 (H) 01/12/2018   BUN 26 (H) 01/12/2018   CREATININE 0.68 01/12/2018   CALCIUM 9.6 01/12/2018   PROT 7.1 01/12/2018   ALBUMIN 3.3 (L) 01/12/2018   AST 14 (L) 01/12/2018   ALT 11 01/12/2018   ALKPHOS 85 01/12/2018   BILITOT 0.4 01/12/2018   GFRNONAA >60 01/12/2018   GFRAA >60 01/12/2018    Lab Results  Component Value Date   WBC 19.0 (H) 01/12/2018   NEUTROABS 16.0 (H) 01/12/2018   HGB 11.5 (L) 01/12/2018   HCT 33.2 (L) 01/12/2018   MCV 89.6 01/12/2018   PLT 319 01/12/2018     STUDIES: Ct Soft Tissue Neck W Contrast  Result Date: 12/20/2017 CLINICAL DATA:  Chronic cough. Mass on the right side of the neck  noted over the last 3 weeks. EXAM: CT NECK WITH CONTRAST TECHNIQUE: Multidetector CT imaging of the neck was performed using the standard protocol following the bolus administration of intravenous contrast. CONTRAST:  33mL ISOVUE-300 IOPAMIDOL (ISOVUE-300) INJECTION 61% COMPARISON:  Cervical radiographs 11/04/2017 2 FINDINGS: Pharynx and larynx: No mucosal or submucosal lesion is seen. Salivary glands: Parotid and submandibular glands are normal. Thyroid: Normal Lymph nodes: No abnormal nodes in the left neck. There is a right supraclavicular necrotic lymph node measuring 2.2 cm in diameter. See below regarding chest findings. This node relates to the palpable abnormality. Vascular: Patent and normal. Limited intracranial: Normal Visualized orbits: Normal Mastoids and visualized paranasal sinuses: Clear Skeleton: Normal Upper chest: Massive necrotic lymphadenopathy in the right hilum, peri carinal region, anterior mediastinum and right paratracheal region, certain to represent malignant lymphadenopathy. Primary lesion is not identified in the region examined. The left upper lung appears clear. There is scarring on the right with a few scattered areas of hazy alveolar opacity, not suspicious for primary mass. No visible effusion. Other: None IMPRESSION: The palpable abnormality represents a necrotic right supraclavicular lymph node measuring 2.2 cm in diameter. This is the "tip of the iceberg" as there is massive necrotic lymphadenopathy in the right hilum and mediastinum, certain to be malignant. Presumed lung primary is not demonstrated in the upper half of the chest. See above discussion. These results will be called to the ordering clinician or representative by the Radiologist Assistant, and communication documented in the PACS or zVision Dashboard. Electronically Signed   By: Nelson Chimes M.D.   On: 12/20/2017 11:41   Mr Jeri Cos KZ Contrast  Result Date: 01/01/2018 CLINICAL DATA:  Lung mass.  Staging for  metastatic disease EXAM: MRI HEAD WITHOUT AND WITH CONTRAST TECHNIQUE: Multiplanar, multiecho pulse sequences of the brain and surrounding structures were obtained without and with intravenous contrast. CONTRAST:  72mL MULTIHANCE GADOBENATE DIMEGLUMINE 529 MG/ML IV SOLN COMPARISON:  None. FINDINGS: Brain: Negative for metastatic disease to the brain. Negative for mass lesion or edema. Mild chronic microvascular ischemic change in the white matter. Negative for acute infarct. Negative for hemorrhage. Vascular: Normal arterial flow voids Skull and upper cervical spine:  Negative Sinuses/Orbits: Negative Other: None IMPRESSION: Negative for metastatic disease to brain.  No acute abnormality. Electronically Signed   By: Franchot Gallo M.D.   On: 01/01/2018 06:59   Nm Pet Image Initial (pi) Skull Base To Thigh  Result Date: 12/24/2017 CLINICAL DATA:  Initial treatment strategy for lymphadenopathy. EXAM: NUCLEAR MEDICINE PET SKULL BASE TO THIGH TECHNIQUE: 8.0 mCi F-18 FDG was injected intravenously. Full-ring PET imaging was performed from the skull base to thigh after the radiotracer. CT data was obtained and used for attenuation correction and anatomic localization. Fasting blood glucose: 103 mg/dl COMPARISON:  Neck CT 12/20/2017 FINDINGS: Mediastinal blood pool activity: SUV max 2.6 NECK: 1.5 cm right supraclavicular lymph node is hypermetabolic with SUV max = 82.9. Incidental CT findings: none CHEST: 5.1 x 4.9 cm abnormal soft tissue mass in the right upper mediastinum is markedly hypermetabolic with SUV max = 93.7. Central right lung mass tracking into the inferior right hilum measures 4.3 x 4.3 cm with SUV max = 22.2. Hypermetabolic metastatic disease is seen in the left hilum and anterior mediastinum. Incidental CT findings: Pleural thickening with patchy and nodular airspace disease is seen in the periphery of the right lower lobe showing low level FDG uptake. ABDOMEN/PELVIS: No abnormal hypermetabolic activity  within the liver, pancreas, adrenal glands, or spleen. No hypermetabolic lymph nodes in the abdomen or pelvis. Incidental CT findings: Gallbladder surgically absent. There is abdominal aortic atherosclerosis without aneurysm. SKELETON: No focal hypermetabolic activity to suggest skeletal metastasis. Incidental CT findings: none IMPRESSION: 1. Central right lower lobe/hilar lesion encases and markedly attenuates the bronchus intermedius, possibly primary neoplasm. There is bulky hypermetabolic metastatic disease in the right hilum, mediastinum, left hilum, and right supraclavicular space. 2. Areas of peripheral calcified patchy and nodular opacity in the right lower lobe, indeterminate. This was present on an abdomen and pelvis CT from 12/29/2014. Low level FDG uptake evident and this may represent chronic process such as scar. 3.  Aortic Atherosclerois (ICD10-170.0) Electronically Signed   By: Misty Stanley M.D.   On: 12/24/2017 11:01   Dg Chest Port 1 View  Result Date: 12/31/2017 CLINICAL DATA:  Port-A-Cath placement. EXAM: PORTABLE CHEST 1 VIEW COMPARISON:  11/04/2017 FINDINGS: Stable asymmetric elevation right hemidiaphragm. Right base atelectasis or infiltrate again noted. Left lung clear. The cardio pericardial silhouette is enlarged. Patient is rotated to the right, displacing cardiomediastinal anatomy into the medial right hemithorax. Fullness in the right mediastinum is compatible with the known nodal disease seen on CT scan from 1 week ago. Left Port-A-Cath is new in the interval with the tip positioned at the SVC/RA junction. No evidence for left pneumothorax. IMPRESSION: Left Port-A-Cath tip projects at the SVC/RA junction. No evidence for pneumothorax. Electronically Signed   By: Misty Stanley M.D.   On: 12/31/2017 16:36   Dg C-arm 1-60 Min-no Report  Result Date: 12/31/2017 Fluoroscopy was utilized by the requesting physician.  No radiographic interpretation.    ASSESSMENT: Right lung  cancer  PLAN:   1. Right lung cancer: Patient appears to have stage IIIb disease given her right supraclavicular lymphadenopathy.  PET scan and MRI of the brain results reviewed independently and reported as above confirming stage of disease. Will proceed with treatment as planned with cycle 1 of weekly carboplatin and Taxol today.  Patient also initiated daily XRT today.  Return to clinic as scheduled for her radiation treatments and then in 1 week for further evaluation and consideration of cycle 2. 2.  Wheezing: Continue prednisone taper  as prescribed. 3.  Anxiety: Continue Ativan as needed. 4.  Leukocytosis: Likely reactive, monitor.  Patient expressed understanding and was in agreement with this plan. She also understands that She can call clinic at any time with any questions, concerns, or complaints.   Cancer Staging No matching staging information was found for the patient.  Lloyd Huger, MD   01/15/2018 5:10 PM

## 2018-01-11 ENCOUNTER — Inpatient Hospital Stay: Payer: Medicare Other

## 2018-01-11 ENCOUNTER — Ambulatory Visit
Admission: RE | Admit: 2018-01-11 | Discharge: 2018-01-11 | Disposition: A | Discharge status: Home / Self Care | Payer: Medicare Other | Source: Ambulatory Visit | Attending: Radiation Oncology | Admitting: Radiation Oncology

## 2018-01-11 ENCOUNTER — Other Ambulatory Visit: Payer: Self-pay | Admitting: Oncology

## 2018-01-11 DIAGNOSIS — Z510 Encounter for antineoplastic radiation therapy: Secondary | ICD-10-CM | POA: Diagnosis not present

## 2018-01-12 ENCOUNTER — Ambulatory Visit
Admission: RE | Admit: 2018-01-12 | Discharge: 2018-01-12 | Disposition: A | Discharge status: Home / Self Care | Payer: Medicare Other | Source: Ambulatory Visit | Attending: Radiation Oncology | Admitting: Radiation Oncology

## 2018-01-12 ENCOUNTER — Encounter: Payer: Self-pay | Admitting: Oncology

## 2018-01-12 ENCOUNTER — Inpatient Hospital Stay: Payer: Medicare Other

## 2018-01-12 ENCOUNTER — Encounter: Payer: Self-pay | Admitting: *Deleted

## 2018-01-12 ENCOUNTER — Inpatient Hospital Stay (HOSPITAL_BASED_OUTPATIENT_CLINIC_OR_DEPARTMENT_OTHER): Payer: Medicare Other | Admitting: Oncology

## 2018-01-12 VITALS — BP 129/85 | HR 79 | Resp 16

## 2018-01-12 VITALS — BP 132/84 | HR 78 | Temp 97.0°F | Resp 18 | Wt 149.9 lb

## 2018-01-12 DIAGNOSIS — R062 Wheezing: Secondary | ICD-10-CM

## 2018-01-12 DIAGNOSIS — C3481 Malignant neoplasm of overlapping sites of right bronchus and lung: Secondary | ICD-10-CM

## 2018-01-12 DIAGNOSIS — C3491 Malignant neoplasm of unspecified part of right bronchus or lung: Secondary | ICD-10-CM

## 2018-01-12 DIAGNOSIS — F419 Anxiety disorder, unspecified: Secondary | ICD-10-CM

## 2018-01-12 DIAGNOSIS — Z5111 Encounter for antineoplastic chemotherapy: Secondary | ICD-10-CM | POA: Diagnosis not present

## 2018-01-12 DIAGNOSIS — Z510 Encounter for antineoplastic radiation therapy: Secondary | ICD-10-CM | POA: Diagnosis not present

## 2018-01-12 LAB — CBC WITH DIFFERENTIAL/PLATELET
BASOS ABS: 0 10*3/uL (ref 0–0.1)
BASOS PCT: 0 %
Eosinophils Absolute: 0.1 10*3/uL (ref 0–0.7)
Eosinophils Relative: 0 %
HEMATOCRIT: 33.2 % — AB (ref 35.0–47.0)
HEMOGLOBIN: 11.5 g/dL — AB (ref 12.0–16.0)
Lymphocytes Relative: 8 %
Lymphs Abs: 1.6 10*3/uL (ref 1.0–3.6)
MCH: 31.1 pg (ref 26.0–34.0)
MCHC: 34.7 g/dL (ref 32.0–36.0)
MCV: 89.6 fL (ref 80.0–100.0)
Monocytes Absolute: 1.4 10*3/uL — ABNORMAL HIGH (ref 0.2–0.9)
Monocytes Relative: 7 %
NEUTROS ABS: 16 10*3/uL — AB (ref 1.4–6.5)
NEUTROS PCT: 85 %
Platelets: 319 10*3/uL (ref 150–440)
RBC: 3.7 MIL/uL — ABNORMAL LOW (ref 3.80–5.20)
RDW: 14.1 % (ref 11.5–14.5)
WBC: 19 10*3/uL — AB (ref 3.6–11.0)

## 2018-01-12 LAB — COMPREHENSIVE METABOLIC PANEL
ALK PHOS: 85 U/L (ref 38–126)
ALT: 11 U/L (ref 0–44)
ANION GAP: 11 (ref 5–15)
AST: 14 U/L — ABNORMAL LOW (ref 15–41)
Albumin: 3.3 g/dL — ABNORMAL LOW (ref 3.5–5.0)
BILIRUBIN TOTAL: 0.4 mg/dL (ref 0.3–1.2)
BUN: 26 mg/dL — ABNORMAL HIGH (ref 8–23)
CALCIUM: 9.6 mg/dL (ref 8.9–10.3)
CO2: 27 mmol/L (ref 22–32)
Chloride: 99 mmol/L (ref 98–111)
Creatinine, Ser: 0.68 mg/dL (ref 0.44–1.00)
GFR calc non Af Amer: >60 mL/min (ref >60)
Glucose, Bld: 127 mg/dL — ABNORMAL HIGH (ref 70–99)
Potassium: 4 mmol/L (ref 3.5–5.1)
Sodium: 137 mmol/L (ref 135–145)
TOTAL PROTEIN: 7.1 g/dL (ref 6.5–8.1)

## 2018-01-12 MED ORDER — SODIUM CHLORIDE 0.9 % IV SOLN
45.0000 mg/m2 | Freq: Once | INTRAVENOUS | Status: AC
  Administered 2018-01-12: 78 mg via INTRAVENOUS
  Filled 2018-01-12: qty 13

## 2018-01-12 MED ORDER — SODIUM CHLORIDE 0.9 % IV SOLN
170.0000 mg | Freq: Once | INTRAVENOUS | Status: AC
  Administered 2018-01-12: 170 mg via INTRAVENOUS
  Filled 2018-01-12: qty 17

## 2018-01-12 MED ORDER — HEPARIN SOD (PORK) LOCK FLUSH 100 UNIT/ML IV SOLN
500.0000 [IU] | Freq: Once | INTRAVENOUS | Status: AC
  Administered 2018-01-12: 500 [IU] via INTRAVENOUS
  Filled 2018-01-12: qty 5

## 2018-01-12 MED ORDER — SODIUM CHLORIDE 0.9% FLUSH
10.0000 mL | INTRAVENOUS | Status: DC | PRN
  Administered 2018-01-12: 10 mL via INTRAVENOUS
  Filled 2018-01-12: qty 10

## 2018-01-12 MED ORDER — SODIUM CHLORIDE 0.9 % IV SOLN
Freq: Once | INTRAVENOUS | Status: AC
Start: 2018-01-12 — End: 2018-01-12
  Administered 2018-01-12: 10:00:00 via INTRAVENOUS
  Filled 2018-01-12: qty 1000

## 2018-01-12 MED ORDER — FAMOTIDINE IN NACL 20-0.9 MG/50ML-% IV SOLN
20.0000 mg | Freq: Once | INTRAVENOUS | Status: AC
  Administered 2018-01-12: 20 mg via INTRAVENOUS
  Filled 2018-01-12: qty 50

## 2018-01-12 MED ORDER — PALONOSETRON HCL INJECTION 0.25 MG/5ML
0.2500 mg | Freq: Once | INTRAVENOUS | Status: AC
  Administered 2018-01-12: 0.25 mg via INTRAVENOUS
  Filled 2018-01-12: qty 5

## 2018-01-12 MED ORDER — SODIUM CHLORIDE 0.9 % IV SOLN
20.0000 mg | Freq: Once | INTRAVENOUS | Status: AC
  Administered 2018-01-12: 20 mg via INTRAVENOUS
  Filled 2018-01-12: qty 2

## 2018-01-12 MED ORDER — DIPHENHYDRAMINE HCL 50 MG/ML IJ SOLN
50.0000 mg | Freq: Once | INTRAMUSCULAR | Status: AC
  Administered 2018-01-12: 50 mg via INTRAVENOUS
  Filled 2018-01-12: qty 1

## 2018-01-12 NOTE — Progress Notes (Signed)
Patient here today for follow up and treatment consideration regarding lung cancer.

## 2018-01-12 NOTE — Progress Notes (Signed)
  Oncology Nurse Navigator Documentation  Navigator Location: [facility] (01/12/18 1100)   )Navigator Encounter Type: Treatment (01/12/18 1100)     Confirmed Diagnosis Date: 01/06/18 (01/12/18 1100)             Treatment Initiated Date: 01/12/18 (01/12/18 1100) Patient Visit Type: MedOnc;RadOnc (01/12/18 1100) Treatment Phase: First Chemo Tx;First Radiation Tx (01/12/18 1100) Barriers/Navigation Needs: No barriers at this time (01/12/18 1100)   Interventions: None required (01/12/18 1100)           met with patient and family prior to first chemo treatment. All questions answered at the time of visit. Reviewed upcoming appts with patient and family. Nothing further needed at this time. Informed pt to call if has any further questions or needs. Pt verbalized understanding.  Omniseq and CancerTypeID have been ordered per Dr. Tasia Catchings request.            Time Spent with Patient: 60 (01/12/18 1100)

## 2018-01-13 ENCOUNTER — Ambulatory Visit
Admission: RE | Admit: 2018-01-13 | Discharge: 2018-01-13 | Disposition: A | Discharge status: Home / Self Care | Payer: Medicare Other | Source: Ambulatory Visit | Attending: Radiation Oncology | Admitting: Radiation Oncology

## 2018-01-13 DIAGNOSIS — Z510 Encounter for antineoplastic radiation therapy: Secondary | ICD-10-CM | POA: Diagnosis not present

## 2018-01-14 ENCOUNTER — Ambulatory Visit: Payer: Medicare Other | Admitting: Gastroenterology

## 2018-01-14 ENCOUNTER — Ambulatory Visit
Admission: RE | Admit: 2018-01-14 | Discharge: 2018-01-14 | Disposition: A | Discharge status: Home / Self Care | Payer: Medicare Other | Source: Ambulatory Visit | Attending: Radiation Oncology | Admitting: Radiation Oncology

## 2018-01-14 DIAGNOSIS — Z510 Encounter for antineoplastic radiation therapy: Secondary | ICD-10-CM | POA: Diagnosis not present

## 2018-01-17 ENCOUNTER — Ambulatory Visit
Admission: RE | Admit: 2018-01-17 | Discharge: 2018-01-17 | Disposition: A | Discharge status: Home / Self Care | Payer: Medicare Other | Source: Ambulatory Visit | Attending: Radiation Oncology | Admitting: Radiation Oncology

## 2018-01-17 DIAGNOSIS — Z510 Encounter for antineoplastic radiation therapy: Secondary | ICD-10-CM | POA: Diagnosis not present

## 2018-01-17 DIAGNOSIS — C3491 Malignant neoplasm of unspecified part of right bronchus or lung: Secondary | ICD-10-CM | POA: Diagnosis present

## 2018-01-18 ENCOUNTER — Ambulatory Visit
Admission: RE | Admit: 2018-01-18 | Discharge: 2018-01-18 | Disposition: A | Discharge status: Home / Self Care | Payer: Medicare Other | Source: Ambulatory Visit | Attending: Radiation Oncology | Admitting: Radiation Oncology

## 2018-01-18 DIAGNOSIS — C3491 Malignant neoplasm of unspecified part of right bronchus or lung: Secondary | ICD-10-CM | POA: Diagnosis not present

## 2018-01-19 ENCOUNTER — Inpatient Hospital Stay: Payer: Medicare Other | Attending: Oncology

## 2018-01-19 ENCOUNTER — Inpatient Hospital Stay: Payer: Medicare Other

## 2018-01-19 ENCOUNTER — Other Ambulatory Visit: Payer: Self-pay

## 2018-01-19 ENCOUNTER — Ambulatory Visit
Admission: RE | Admit: 2018-01-19 | Discharge: 2018-01-19 | Disposition: A | Discharge status: Home / Self Care | Payer: Medicare Other | Source: Ambulatory Visit | Attending: Radiation Oncology | Admitting: Radiation Oncology

## 2018-01-19 ENCOUNTER — Inpatient Hospital Stay (HOSPITAL_BASED_OUTPATIENT_CLINIC_OR_DEPARTMENT_OTHER): Payer: Medicare Other | Admitting: Oncology

## 2018-01-19 ENCOUNTER — Encounter: Payer: Self-pay | Admitting: Oncology

## 2018-01-19 VITALS — BP 123/77 | HR 93 | Temp 96.2°F | Resp 18 | Wt 147.5 lb

## 2018-01-19 VITALS — BP 136/83 | HR 88 | Resp 20

## 2018-01-19 DIAGNOSIS — C3491 Malignant neoplasm of unspecified part of right bronchus or lung: Secondary | ICD-10-CM | POA: Diagnosis not present

## 2018-01-19 DIAGNOSIS — R590 Localized enlarged lymph nodes: Secondary | ICD-10-CM

## 2018-01-19 DIAGNOSIS — R0602 Shortness of breath: Secondary | ICD-10-CM | POA: Diagnosis not present

## 2018-01-19 DIAGNOSIS — C3481 Malignant neoplasm of overlapping sites of right bronchus and lung: Secondary | ICD-10-CM

## 2018-01-19 DIAGNOSIS — F419 Anxiety disorder, unspecified: Secondary | ICD-10-CM | POA: Diagnosis not present

## 2018-01-19 DIAGNOSIS — R221 Localized swelling, mass and lump, neck: Secondary | ICD-10-CM | POA: Insufficient documentation

## 2018-01-19 DIAGNOSIS — D72829 Elevated white blood cell count, unspecified: Secondary | ICD-10-CM | POA: Diagnosis not present

## 2018-01-19 DIAGNOSIS — Z452 Encounter for adjustment and management of vascular access device: Secondary | ICD-10-CM | POA: Diagnosis not present

## 2018-01-19 DIAGNOSIS — D649 Anemia, unspecified: Secondary | ICD-10-CM

## 2018-01-19 DIAGNOSIS — R51 Headache: Secondary | ICD-10-CM | POA: Insufficient documentation

## 2018-01-19 DIAGNOSIS — R062 Wheezing: Secondary | ICD-10-CM | POA: Insufficient documentation

## 2018-01-19 DIAGNOSIS — Z5111 Encounter for antineoplastic chemotherapy: Secondary | ICD-10-CM | POA: Diagnosis not present

## 2018-01-19 DIAGNOSIS — D6481 Anemia due to antineoplastic chemotherapy: Secondary | ICD-10-CM | POA: Diagnosis not present

## 2018-01-19 LAB — CBC WITH DIFFERENTIAL/PLATELET
Basophils Absolute: 0 10*3/uL (ref 0–0.1)
Basophils Relative: 0 %
Eosinophils Absolute: 0.1 10*3/uL (ref 0–0.7)
Eosinophils Relative: 1 %
HEMATOCRIT: 35 % (ref 35.0–47.0)
HEMOGLOBIN: 11.9 g/dL — AB (ref 12.0–16.0)
LYMPHS ABS: 1.3 10*3/uL (ref 1.0–3.6)
Lymphocytes Relative: 12 %
MCH: 31.2 pg (ref 26.0–34.0)
MCHC: 34.1 g/dL (ref 32.0–36.0)
MCV: 91.6 fL (ref 80.0–100.0)
MONO ABS: 0.8 10*3/uL (ref 0.2–0.9)
MONOS PCT: 7 %
NEUTROS ABS: 8.9 10*3/uL — AB (ref 1.4–6.5)
NEUTROS PCT: 80 %
Platelets: 269 10*3/uL (ref 150–440)
RBC: 3.82 MIL/uL (ref 3.80–5.20)
RDW: 14.2 % (ref 11.5–14.5)
WBC: 11.3 10*3/uL — ABNORMAL HIGH (ref 3.6–11.0)

## 2018-01-19 LAB — COMPREHENSIVE METABOLIC PANEL
ALK PHOS: 84 U/L (ref 38–126)
ALT: 14 U/L (ref 0–44)
ANION GAP: 10 (ref 5–15)
AST: 24 U/L (ref 15–41)
Albumin: 3.3 g/dL — ABNORMAL LOW (ref 3.5–5.0)
BUN: 25 mg/dL — ABNORMAL HIGH (ref 8–23)
CALCIUM: 9.6 mg/dL (ref 8.9–10.3)
CHLORIDE: 103 mmol/L (ref 98–111)
CO2: 24 mmol/L (ref 22–32)
Creatinine, Ser: 0.85 mg/dL (ref 0.44–1.00)
GFR calc Af Amer: >60 mL/min (ref >60)
GFR calc non Af Amer: >60 mL/min (ref >60)
GLUCOSE: 196 mg/dL — AB (ref 70–99)
Potassium: 3.7 mmol/L (ref 3.5–5.1)
SODIUM: 137 mmol/L (ref 135–145)
Total Bilirubin: 0.5 mg/dL (ref 0.3–1.2)
Total Protein: 7 g/dL (ref 6.5–8.1)

## 2018-01-19 MED ORDER — FAMOTIDINE IN NACL 20-0.9 MG/50ML-% IV SOLN
20.0000 mg | Freq: Once | INTRAVENOUS | Status: AC
  Administered 2018-01-19: 20 mg via INTRAVENOUS
  Filled 2018-01-19: qty 50

## 2018-01-19 MED ORDER — SODIUM CHLORIDE 0.9 % IV SOLN
45.0000 mg/m2 | Freq: Once | INTRAVENOUS | Status: AC
  Administered 2018-01-19: 78 mg via INTRAVENOUS
  Filled 2018-01-19: qty 13

## 2018-01-19 MED ORDER — SODIUM CHLORIDE 0.9 % IV SOLN
Freq: Once | INTRAVENOUS | Status: AC
  Administered 2018-01-19: 10:00:00 via INTRAVENOUS
  Filled 2018-01-19: qty 1000

## 2018-01-19 MED ORDER — DIPHENHYDRAMINE HCL 50 MG/ML IJ SOLN
50.0000 mg | Freq: Once | INTRAMUSCULAR | Status: AC
  Administered 2018-01-19: 50 mg via INTRAVENOUS
  Filled 2018-01-19: qty 1

## 2018-01-19 MED ORDER — PREDNISONE 20 MG PO TABS: 20.0000 mg | ORAL_TABLET | Freq: Every day | ORAL | 0 refills | Status: DC

## 2018-01-19 MED ORDER — HEPARIN SOD (PORK) LOCK FLUSH 100 UNIT/ML IV SOLN
500.0000 [IU] | Freq: Once | INTRAVENOUS | Status: AC
  Administered 2018-01-19: 500 [IU] via INTRAVENOUS
  Filled 2018-01-19: qty 5

## 2018-01-19 MED ORDER — PALONOSETRON HCL INJECTION 0.25 MG/5ML
0.2500 mg | Freq: Once | INTRAVENOUS | Status: AC
  Administered 2018-01-19: 0.25 mg via INTRAVENOUS
  Filled 2018-01-19: qty 5

## 2018-01-19 MED ORDER — SODIUM CHLORIDE 0.9% FLUSH
10.0000 mL | Freq: Once | INTRAVENOUS | Status: AC
  Administered 2018-01-19: 10 mL via INTRAVENOUS
  Filled 2018-01-19: qty 10

## 2018-01-19 MED ORDER — DEXAMETHASONE SODIUM PHOSPHATE 100 MG/10ML IJ SOLN
20.0000 mg | Freq: Once | INTRAMUSCULAR | Status: AC
  Administered 2018-01-19: 20 mg via INTRAVENOUS
  Filled 2018-01-19: qty 2

## 2018-01-19 MED ORDER — SODIUM CHLORIDE 0.9 % IV SOLN
171.4000 mg | Freq: Once | INTRAVENOUS | Status: AC
  Administered 2018-01-19: 170 mg via INTRAVENOUS
  Filled 2018-01-19: qty 17

## 2018-01-19 NOTE — Progress Notes (Signed)
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  female with PMH listed below who was referred to me for evaluation of neck mass. Patient reports having cough for about 2 to 3 months.  Started after a mild Upper respiratory infection, and becomes chronic.  Associate with throat discomfort.   Denies any hemoptysis, yellowish or greenish sputum. Nothing make cough better or worse.  She also noticed right neck lump for couple of weeks, the neck lump is tender.  Associated with right side neck,  and right side headache.  Denies any vision changes.Her appetite has decreased for the past couple of months and lost  a few pounds. Denies any night sweating, fever or chills. She is a never smoker, husband  and used to smoke at home. Denies any known family history of cancer.  Data Reviewed: 12/20/2017 CT neck soft tissue with contrast showed palpable abnormality represent a necrotic right supraclavicular lymph node measuring 2.2 cm in diameter.  This is the tip of the iceberg as there is massive necrotic lymphadenopathy in the right hilum and mediastinum, starting to be malignant.  Presumed lung primary is not demonstrated in the upper half of the chest.  # Patient's case were discussed on tumor board on 01/06/2018.  Per pathology, this is most consistent with non-small cell lung cancer, poorly differentiated..  I called Dr.Onley today and she confirmed that additional IHC stains all came back negative for lymphoma.   INTERVAL HISTORY Dana Ramirez 67 y.o.  female with above history who presents for evaluation prior to cycle 2 concurrent chemotherapy treatment with [place] and Taxol For stage III non-small cell lung cancer. Problems and complaints are listed below: Chemotherapy induced nausea: Tolerates cycle 1 carbo and Taxol well.  Denies nausea vomiting. Neoplasm related fatigue: Mild fatigue, chronic stable. Neoplasm related pain scale: Denies any pain Opioid associated constipation: Not on any narcotics Shortness of breath with wheezing, on prednisone 50 mg daily.  Wheezing slightly better.  Review of Systems  Constitutional: Positive for malaise/fatigue. Negative for chills, fever and weight loss.  HENT: Negative for congestion, ear discharge, ear pain, nosebleeds, sinus pain and sore throat.   Eyes: Negative for double vision, photophobia, pain, discharge and redness.  Respiratory: Positive for cough and wheezing. Negative for hemoptysis, sputum production and shortness of breath.   Cardiovascular: Negative for chest pain, palpitations, orthopnea, claudication and leg swelling.  Gastrointestinal: Negative for abdominal pain, blood in stool, constipation, diarrhea, heartburn, melena, nausea and vomiting.  Genitourinary: Negative for dysuria, flank pain, frequency and hematuria.  Musculoskeletal: Negative for back pain, myalgias and neck pain.  Skin: Negative for itching and rash.  Neurological: Negative for dizziness, tingling, tremors, focal weakness, weakness and headaches.  Endo/Heme/Allergies: Negative for environmental allergies. Does not bruise/bleed easily.  Psychiatric/Behavioral: Negative for depression and hallucinations. The patient is not nervous/anxious.     MEDICAL HISTORY:  Past Medical History:  Diagnosis Date  . CAD (coronary artery disease)   . Carotid arterial disease ([place])   . Diabetes mellitus without complication ([place])   . History of vertigo   .  Hyperlipidemia   . Hypertension   . Kidney  stone     SURGICAL HISTORY: Past Surgical History:  Procedure Laterality Date  . BREAST CYST ASPIRATION Left 1980s  . CESAREAN SECTION     x 2  . CHOLECYSTECTOMY    . CORONARY ARTERY BYPASS GRAFT  03/2005   LIMA to the LAD, vein graft to diagonal with a negative stress test in 06/2006  . PORTACATH PLACEMENT Left 12/31/2017   Procedure: INSERTION PORT-A-CATH;  Surgeon: Robert Bellow, MD;  Location: [facility] ORS;  Service: General;  Laterality: Left;  . SENTINEL NODE BIOPSY Right 12/31/2017   Procedure: SUPRACLAVICULAR NODE BIOPSY;  Surgeon: Robert Bellow, MD;  Location: [facility] ORS;  Service: General;  Laterality: Right;  . TONSILLECTOMY      SOCIAL HISTORY: Social History   Socioeconomic History  . Marital status: Married    Spouse name: Elenore Rota   . Number of children: 2  . Years of education: Not on file  . Highest education level: Not on file  Occupational History  . Occupation: Retired     Fish farm manager: OTHER    Comment: [employer]  Social Needs  . Financial resource strain: Not on file  . Food insecurity:    Worry: Not on file    Inability: Not on file  . Transportation needs:    Medical: Not on file    Non-medical: Not on file  Tobacco Use  . Smoking status: Never Smoker  . Smokeless tobacco: Never Used  Substance and Sexual Activity  . Alcohol use: No  . Drug use: No  . Sexual activity: Not on file  Lifestyle  . Physical activity:    Days per week: Not on file    Minutes per session: Not on file  . Stress: Not on file  Relationships  . Social connections:    Talks on phone: Not on file    Gets together: Not on file    Attends religious service: Not on file    Active member of club or organization: Not on file    Attends meetings of clubs or organizations: Not on file    Relationship status: Not on file  . Intimate partner violence:    Fear of current or ex partner: Not on file    Emotionally abused: Not on file    Physically abused: Not on file    Forced  sexual activity: Not on file  Other Topics Concern  . Not on file  Social History Narrative   Married   Gets regular exercise, once or twice every week    FAMILY HISTORY: Family History  Problem Relation Age of Onset  . Heart disease Mother   . Heart disease Father 33  . Stroke Sister 7  . Heart disease Sister   . Diabetes Brother   . Coronary artery disease Other        Strong family history of CAD and CABG  . Heart disease Maternal Grandmother   . Breast cancer Maternal Grandmother   . Heart disease Maternal Grandfather   . Heart disease Paternal Grandmother   . Breast cancer Paternal Grandmother 30  . Heart disease Paternal Grandfather     ALLERGIES:  is allergic to codeine; lisinopril; and penicillins.  MEDICATIONS:  Current Outpatient Medications  Medication Sig Dispense Refill  . acetaminophen (TYLENOL) 650 MG CR tablet Take 650 mg by mouth every 8 (eight) hours as needed for pain.    Marland Kitchen albuterol (PROVENTIL HFA;VENTOLIN HFA) 108 (90 Base)  MCG/ACT inhaler Inhale 2 puffs into the lungs every 6 (six) hours as needed for wheezing or shortness of breath. 1 Inhaler 2  . aspirin (ASPIR-LOW) 81 MG EC tablet Take 81 mg by mouth 2 (two) times daily.     . Cholecalciferol (VITAMIN D3) 1000 units CAPS Take 1 capsule by mouth daily.    . cyanocobalamin 1000 MCG tablet Take 1,000 mcg by mouth daily.    . cyclobenzaprine (FLEXERIL) 10 MG tablet Take 1 tablet (10 mg total) by mouth 3 (three) times daily as needed for muscle spasms. 30 tablet 0  . glipiZIDE (GLUCOTROL XL) 2.5 MG 24 hr tablet TAKE ONE TABLET BY MOUTH EVERY MORNING WITH BREAKFAST 90 tablet 1  . JANUVIA 100 MG tablet TAKE ONE TABLET BY MOUTH DAILY 90 tablet 1  . lidocaine-prilocaine (EMLA) cream Apply to affected area once 30 g 3  . LORazepam (ATIVAN) 0.5 MG tablet Take 1 tablet (0.5 mg total) by mouth every 8 (eight) hours as needed for anxiety (nausea). 30 tablet 0  . metFORMIN (GLUCOPHAGE) 1000 MG tablet TAKE ONE  TABLET BY MOUTH DAILY 90 tablet 1  . metoprolol succinate (TOPROL-XL) 25 MG 24 hr tablet TAKE 1 TABLET DAILY 90 tablet 3  . omeprazole (PRILOSEC OTC) 20 MG tablet Take 20 mg by mouth daily.    . ondansetron (ZOFRAN) 8 MG tablet Take 1 tablet (8 mg total) by mouth every 8 (eight) hours as needed for nausea or vomiting. 30 tablet 1  . ondansetron (ZOFRAN) 8 MG tablet Take 1 tablet (8 mg total) by mouth 2 (two) times daily as needed for refractory nausea / vomiting. Start on day 3 after chemo. 30 tablet 1  . prochlorperazine (COMPAZINE) 10 MG tablet Take 1 tablet (10 mg total) by mouth every 6 (six) hours as needed (Nausea or vomiting). 30 tablet 1  . rosuvastatin (CRESTOR) 20 MG tablet TAKE ONE TABLET BY MOUTH DAILY 90 tablet 1  . predniSONE (DELTASONE) 20 MG tablet Take 1 tablet (20 mg total) by mouth daily with breakfast. 7 tablet 0  . traMADol (ULTRAM) 50 MG tablet Take 1 tablet (50 mg total) by mouth every 6 (six) hours as needed. (Patient not taking: Reported on 01/19/2018) 30 tablet 0   No current facility-administered medications for this visit.    Facility-Administered Medications Ordered in Other Visits  Medication Dose Route Frequency Provider Last Rate Last Dose  . CARBOplatin (PARAPLATIN) 170 mg in sodium chloride 0.9 % 250 mL chemo infusion  170 mg Intravenous Once Earlie Server, MD      . dexamethasone (DECADRON) 20 mg in sodium chloride 0.9 % 50 mL IVPB  20 mg Intravenous Once Earlie Server, MD      . famotidine (PEPCID) IVPB 20 mg premix  20 mg Intravenous Once Earlie Server, MD   Stopped at 01/19/18 1053  . heparin lock flush 100 unit/mL  500 Units Intravenous Once Earlie Server, MD      . PACLitaxel (TAXOL) 78 mg in sodium chloride 0.9 % 250 mL chemo infusion (</= 80mg /m2)  45 mg/m2 (Treatment Plan Recorded) Intravenous Once Earlie Server, MD         PHYSICAL EXAMINATION: ECOG PERFORMANCE STATUS: 1 - Symptomatic but completely ambulatory Vitals:   01/19/18 0907  BP: 123/77  Pulse: 93  Resp: 18    Temp: (!) 96.2 F (35.7 C)  SpO2: 93%   Filed Weights   01/19/18 0907  Weight: 147 lb 8 oz (66.9 kg)    Physical Exam  Constitutional: She is oriented to person, place, and time. She appears well-developed and well-nourished. No distress.  HENT:  Head: Normocephalic and atraumatic.  Right Ear: External ear normal.  Left Ear: External ear normal.  Mouth/Throat: Oropharynx is clear and moist.  Eyes: Pupils are equal, round, and reactive to light. Conjunctivae and EOM are normal. No scleral icterus.  Neck: Normal range of motion. Neck supple.  Right supra clavicular lymphadenopathy,Status post excisional biopsy.  Cardiovascular: Normal rate, regular rhythm and normal heart sounds.  Pulmonary/Chest: Effort normal. No respiratory distress. She has wheezes. She has no rales. She exhibits no tenderness.  Abdominal: Soft. Bowel sounds are normal. She exhibits no distension and no mass. There is no tenderness.  Musculoskeletal: Normal range of motion. She exhibits no edema or deformity.  Neurological: She is alert and oriented to person, place, and time. No cranial nerve deficit. Coordination normal.  Skin: Skin is warm and dry. No rash noted.  Psychiatric: She has a normal mood and affect. Her behavior is normal. Thought content normal.     LABORATORY DATA:  I have reviewed the data as listed Lab Results  Component Value Date   WBC 11.3 (H) 01/19/2018   HGB 11.9 (L) 01/19/2018   HCT 35.0 01/19/2018   MCV 91.6 01/19/2018   PLT 269 01/19/2018   Recent Labs    12/21/17 0922 01/12/18 0820 01/19/18 0819  NA 139 137 137  K 4.6 4.0 3.7  CL 102 99 103  CO2 26 27 24   GLUCOSE 144* 127* 196*  BUN 16 26* 25*  CREATININE 0.69 0.68 0.85  CALCIUM 10.0 9.6 9.6  GFRNONAA >60 >60 >60  GFRAA >60 >60 >60  PROT 7.9 7.1 7.0  ALBUMIN 3.9 3.3* 3.3*  AST 16 14* 24  ALT 11* 11 14  ALKPHOS 93 85 84  BILITOT 0.3 0.4 0.5       ASSESSMENT & PLAN:  1. Malignant neoplasm of overlapping  sites of right lung ([place])   2. Supraclavicular lymphadenopathy   3. SOB (shortness of breath)   4. Anemia, unspecified type   5. Encounter for antineoplastic chemotherapy    #Non-small cell lung cancer, cTx N3M0, Stage III: massive necrotizing neck/mediastinum/hilum lymphadenopathy. Currently on concurrent chemoRT Status post 1 cycles of carbotaxol last week.   tolerates well. Labs reviewed and discussed with patient and her daughter. Counts are acceptable to proceed with today's carbo/taxol. Continue follow-up with radiation oncology for daily radiation.  # Due to the aggressivity of her disease, we have sent cancer type and omniseq testing for molecular study # Wheezing: Due to lung neoplasm.  Wheezing slightly better since the start of treatment.  Will decrease to 20 mg daily, and slowly taper down if her respiratory symptoms continue to improve.    # Anxiety: Manageable.  She has Ativan 0.5mg  Q8h as needed for nausea or anxiety. .   # Anemia; explained lab work with patient and her daughter. She has mild anemia, most likely due to chemo/RT. Continue to monitor.  All questions were answered to her satisfaction.  The patient knows to call the clinic with any problems questions or concerns.  Return of visit: 1 week for assessment prior to next cycle of chemotherapy. Total face to face encounter time for this patient visit was 25 min. >50% of the time was  spent in counseling and coordination of care.   Earlie Server, MD, PhD Hematology Oncology [facility] Pager- [phone] 01/19/2018

## 2018-01-19 NOTE — Progress Notes (Signed)
Patient here for follow up. No concerns voiced.  °

## 2018-01-21 ENCOUNTER — Ambulatory Visit
Admission: RE | Admit: 2018-01-21 | Discharge: 2018-01-21 | Disposition: A | Discharge status: Home / Self Care | Payer: Medicare Other | Source: Ambulatory Visit | Attending: Radiation Oncology | Admitting: Radiation Oncology

## 2018-01-21 DIAGNOSIS — C3481 Malignant neoplasm of overlapping sites of right bronchus and lung: Secondary | ICD-10-CM

## 2018-01-21 DIAGNOSIS — C3491 Malignant neoplasm of unspecified part of right bronchus or lung: Secondary | ICD-10-CM | POA: Diagnosis not present

## 2018-01-24 ENCOUNTER — Ambulatory Visit
Admission: RE | Admit: 2018-01-24 | Discharge: 2018-01-24 | Disposition: A | Discharge status: Home / Self Care | Payer: Medicare Other | Source: Ambulatory Visit | Attending: Radiation Oncology | Admitting: Radiation Oncology

## 2018-01-24 ENCOUNTER — Encounter: Payer: Self-pay | Admitting: Internal Medicine

## 2018-01-24 ENCOUNTER — Ambulatory Visit: Payer: Medicare Other | Admitting: Internal Medicine

## 2018-01-24 DIAGNOSIS — E538 Deficiency of other specified B group vitamins: Secondary | ICD-10-CM | POA: Diagnosis not present

## 2018-01-24 DIAGNOSIS — R05 Cough: Secondary | ICD-10-CM

## 2018-01-24 DIAGNOSIS — E119 Type 2 diabetes mellitus without complications: Secondary | ICD-10-CM | POA: Diagnosis not present

## 2018-01-24 DIAGNOSIS — C3481 Malignant neoplasm of overlapping sites of right bronchus and lung: Secondary | ICD-10-CM

## 2018-01-24 DIAGNOSIS — R059 Cough, unspecified: Secondary | ICD-10-CM

## 2018-01-24 DIAGNOSIS — C3491 Malignant neoplasm of unspecified part of right bronchus or lung: Secondary | ICD-10-CM | POA: Diagnosis not present

## 2018-01-24 NOTE — Patient Instructions (Addendum)
You will need  a fingerstick A1c after July 19th to monitor your diabetes   If your blood pressure starts  to stay below  407 systolic ,  Reduce the metoprolol to 1/2 tablet daily  Ok to add daily colace to your miralax for constipation   You can cut your metformin in half and take 1/2 twice daily .  If the cut tablet is too jagged,  Just stop taking it

## 2018-01-24 NOTE — Progress Notes (Signed)
Subjective:  Patient ID: Dana Ramirez, female    DOB: 12-25-1950  Age: 67 y.o. MRN: [id number]  CC: Diagnoses of Cough in adult patient, Diabetes mellitus without complication ([place]), A63 deficiency, and Malignant neoplasm of overlapping sites of right lung [facility]) were pertinent to this visit.  HPI Dana Ramirez presents for follow up on type 2 DM, hyperlipidemia.   Since her last visit in May,  Workup of persistent cough and new onset neck swelling resulted in diagnosis of  poorly differentiated  stage III  non small cell  Lung cancer  After a large necrotic cervical lymph node was biopsied. .  Currently undergoing XRT. Finished  1 st cycle of carbo and Taxol without nausea and vomiting Persistent wheezing, treated with prednisone currently on 20 mg daily reduce from 50 mg     Most  Recent CBG was 127 after eating  Using lorazepam at night, per daughter's request.  No nausea .  Has lost 7 lb s. energy level good.   3 month follow up on diabetes.  Patient has no complaints today.  Patient is following a low glycemic index diet and taking all prescribed medications regularly but having trouble swallowing the metformin due to size.   Fasting sugars have been under less than 140 most of the time and post prandials have been under 160 except on rare occasions. Patient is exercising about 3 times per week and intentionally trying to lose weight .  Patient has had an eye exam in the last 12 months and checks feet regularly for signs of infection.  Patient does not walk barefoot outside,  And denies an numbness tingling or burning in feet. Patient is up to date on all recommended vaccinations.         Outpatient Medications Prior to Visit  Medication Sig Dispense Refill  . acetaminophen (TYLENOL) 650 MG CR tablet Take 650 mg by mouth every 8 (eight) hours as needed for pain.    Marland Kitchen albuterol (PROVENTIL HFA;VENTOLIN HFA) 108 (90 Base) MCG/ACT inhaler Inhale 2 puffs into the lungs  every 6 (six) hours as needed for wheezing or shortness of breath. 1 Inhaler 2  . aspirin (ASPIR-LOW) 81 MG EC tablet Take 81 mg by mouth 2 (two) times daily.     . Cholecalciferol (VITAMIN D3) 1000 units CAPS Take 1 capsule by mouth daily.    . cyanocobalamin 1000 MCG tablet Take 1,000 mcg by mouth daily.    . cyclobenzaprine (FLEXERIL) 10 MG tablet Take 1 tablet (10 mg total) by mouth 3 (three) times daily as needed for muscle spasms. 30 tablet 0  . glipiZIDE (GLUCOTROL XL) 2.5 MG 24 hr tablet TAKE ONE TABLET BY MOUTH EVERY MORNING WITH BREAKFAST 90 tablet 1  . JANUVIA 100 MG tablet TAKE ONE TABLET BY MOUTH DAILY 90 tablet 1  . lidocaine-prilocaine (EMLA) cream Apply to affected area once 30 g 3  . LORazepam (ATIVAN) 0.5 MG tablet Take 1 tablet (0.5 mg total) by mouth every 8 (eight) hours as needed for anxiety (nausea). 30 tablet 0  . metFORMIN (GLUCOPHAGE) 1000 MG tablet TAKE ONE TABLET BY MOUTH DAILY 90 tablet 1  . metoprolol succinate (TOPROL-XL) 25 MG 24 hr tablet TAKE 1 TABLET DAILY 90 tablet 3  . omeprazole (PRILOSEC OTC) 20 MG tablet Take 20 mg by mouth daily.    . ondansetron (ZOFRAN) 8 MG tablet Take 1 tablet (8 mg total) by mouth every 8 (eight) hours as needed for nausea or vomiting.  30 tablet 1  . ondansetron (ZOFRAN) 8 MG tablet Take 1 tablet (8 mg total) by mouth 2 (two) times daily as needed for refractory nausea / vomiting. Start on day 3 after chemo. 30 tablet 1  . predniSONE (DELTASONE) 20 MG tablet Take 1 tablet (20 mg total) by mouth daily with breakfast. 7 tablet 0  . prochlorperazine (COMPAZINE) 10 MG tablet Take 1 tablet (10 mg total) by mouth every 6 (six) hours as needed (Nausea or vomiting). 30 tablet 1  . rosuvastatin (CRESTOR) 20 MG tablet TAKE ONE TABLET BY MOUTH DAILY 90 tablet 1  . traMADol (ULTRAM) 50 MG tablet Take 1 tablet (50 mg total) by mouth every 6 (six) hours as needed. (Patient not taking: Reported on 01/19/2018) 30 tablet 0   No facility-administered  medications prior to visit.     Review of Systems;  Patient denies headache, fevers, malaise, unintentional weight loss, skin rash, eye pain, sinus congestion and sinus pain, sore throat, dysphagia,  hemoptysis , cough, dyspnea, wheezing, chest pain, palpitations, orthopnea, edema, abdominal pain, nausea, melena, diarrhea, constipation, flank pain, dysuria, hematuria, urinary  Frequency, nocturia, numbness, tingling, seizures,  Focal weakness, Loss of consciousness,  Tremor, insomnia, depression, anxiety, and suicidal ideation.      Objective:  BP 108/68 (BP Location: Left Arm, Patient Position: Sitting, Cuff Size: Normal)   Pulse 95   Temp 97.8 F (36.6 C) (Oral)   Resp 16   Wt 147 lb (66.7 kg)   SpO2 98%   BMI 26.04 kg/m   BP Readings from Last 3 Encounters:  01/24/18 108/68  01/19/18 136/83  01/19/18 123/77    Wt Readings from Last 3 Encounters:  01/24/18 147 lb (66.7 kg)  01/19/18 147 lb 8 oz (66.9 kg)  01/12/18 149 lb 14.4 oz (68 kg)    General appearance: alert, cooperative and appears stated age Ears: normal TM's and external ear canals both ears Throat: lips, mucosa, and tongue normal; teeth and gums normal Neck: no adenopathy, no carotid bruit, supple, symmetrical, trachea midline and thyroid not enlarged, symmetric, no tenderness/mass/nodules Back: symmetric, no curvature. ROM normal. No CVA tenderness. Lungs: clear to auscultation bilaterally Heart: regular rate and rhythm, S1, S2 normal, no murmur, click, rub or gallop Abdomen: soft, non-tender; bowel sounds normal; no masses,  no organomegaly Pulses: 2+ and symmetric Skin: Skin color, texture, turgor normal. No rashes or lesions Lymph nodes: Cervical, supraclavicular, and axillary nodes normal.  Lab Results  Component Value Date   HGBA1C 7.3 (H) 11/04/2017   HGBA1C 7.1 (H) 07/05/2017   HGBA1C 7.2 (H) 04/05/2017    Lab Results  Component Value Date   CREATININE 0.85 01/19/2018   CREATININE 0.68  01/12/2018   CREATININE 0.69 12/21/2017    Lab Results  Component Value Date   WBC 11.3 (H) 01/19/2018   HGB 11.9 (L) 01/19/2018   HCT 35.0 01/19/2018   PLT 269 01/19/2018   GLUCOSE 196 (H) 01/19/2018   CHOL 126 04/05/2017   TRIG 111.0 04/05/2017   HDL 67.00 04/05/2017   LDLDIRECT 47.0 04/05/2017   LDLCALC 37 04/05/2017   ALT 14 01/19/2018   AST 24 01/19/2018   NA 137 01/19/2018   K 3.7 01/19/2018   CL 103 01/19/2018   CREATININE 0.85 01/19/2018   BUN 25 (H) 01/19/2018   CO2 24 01/19/2018   TSH 2.35 11/04/2017   HGBA1C 7.3 (H) 11/04/2017   MICROALBUR 0.9 04/05/2017    No results found.  Assessment & Plan:   Problem  List Items Addressed This Visit    B12 deficiency    Diagnosed and initially  treated with  I M injections  Now managed with  Oral tablets    Lab Results  Component Value Date   VITAMINB12 1,215 (H) 07/05/2017         Cough in adult patient    Secondary to large llung tumor.  Prednisone dose reduced.      Diabetes mellitus without complication ([place])    Currently well-controlled on current medications .  hemoglobin A1c is at goal . Patient is reminded to schedule an annual eye exam and foot exam is normal today. Patient has no microalbuminuria. Patient is tolerating statin therapy for CAD risk reduction and  NORMOTENSIVE ON METOPROLOL   Lab Results  Component Value Date   HGBA1C 7.3 (H) 11/04/2017   Lab Results  Component Value Date   MICROALBUR 0.9 04/05/2017         Malignant neoplasm of overlapping sites of right lung ([place])    Poorly differentiated stage III non small cell lung CA.  undergoig Chemo /XRT.  Tolerating treatment well.         A total of 25 minutes of face to face time was spent with patient more than half of which was spent in counselling about the above mentioned conditions  and coordination of care  I am having Flossie Dibble "Cathy" maintain her aspirin, cyanocobalamin, metFORMIN, omeprazole, Vitamin D3, glipiZIDE,  JANUVIA, metoprolol succinate, cyclobenzaprine, rosuvastatin, traMADol, albuterol, acetaminophen, ondansetron, lidocaine-prilocaine, ondansetron, prochlorperazine, LORazepam, and predniSONE.  No orders of the defined types were placed in this encounter.   There are no discontinued medications.  Follow-up: Return in about 6 months (around 07/27/2018) for rn visit for a1c fingerstick after July 19 , follow up diabetes.   Crecencio Mc, MD

## 2018-01-25 ENCOUNTER — Ambulatory Visit
Admission: RE | Admit: 2018-01-25 | Discharge: 2018-01-25 | Disposition: A | Discharge status: Home / Self Care | Payer: Medicare Other | Source: Ambulatory Visit | Attending: Radiation Oncology | Admitting: Radiation Oncology

## 2018-01-25 DIAGNOSIS — C3491 Malignant neoplasm of unspecified part of right bronchus or lung: Secondary | ICD-10-CM | POA: Diagnosis not present

## 2018-01-25 NOTE — Assessment & Plan Note (Signed)
Diagnosed and initially  treated with  I M injections  Now managed with  Oral tablets    Lab Results  Component Value Date   VITAMINB12 1,215 (H) 07/05/2017

## 2018-01-25 NOTE — Assessment & Plan Note (Signed)
Secondary to large llung tumor.  Prednisone dose reduced.

## 2018-01-25 NOTE — Assessment & Plan Note (Signed)
Poorly differentiated stage III non small cell lung CA.  undergoig Chemo /XRT.  Tolerating treatment well.

## 2018-01-25 NOTE — Assessment & Plan Note (Signed)
Currently well-controlled on current medications .  hemoglobin A1c is at goal . Patient is reminded to schedule an annual eye exam and foot exam is normal today. Patient has no microalbuminuria. Patient is tolerating statin therapy for CAD risk reduction and  NORMOTENSIVE ON METOPROLOL   Lab Results  Component Value Date   HGBA1C 7.3 (H) 11/04/2017   Lab Results  Component Value Date   MICROALBUR 0.9 04/05/2017

## 2018-01-26 ENCOUNTER — Inpatient Hospital Stay (HOSPITAL_BASED_OUTPATIENT_CLINIC_OR_DEPARTMENT_OTHER): Payer: Medicare Other | Admitting: Oncology

## 2018-01-26 ENCOUNTER — Telehealth: Payer: Self-pay | Admitting: *Deleted

## 2018-01-26 ENCOUNTER — Ambulatory Visit
Admission: RE | Admit: 2018-01-26 | Discharge: 2018-01-26 | Disposition: A | Discharge status: Home / Self Care | Payer: Medicare Other | Source: Ambulatory Visit | Attending: Radiation Oncology | Admitting: Radiation Oncology

## 2018-01-26 ENCOUNTER — Inpatient Hospital Stay: Payer: Medicare Other

## 2018-01-26 ENCOUNTER — Encounter: Payer: Self-pay | Admitting: Oncology

## 2018-01-26 ENCOUNTER — Other Ambulatory Visit: Payer: Self-pay

## 2018-01-26 VITALS — BP 97/62 | HR 93 | Temp 96.5°F | Resp 18 | Wt 147.6 lb

## 2018-01-26 DIAGNOSIS — R590 Localized enlarged lymph nodes: Secondary | ICD-10-CM | POA: Diagnosis not present

## 2018-01-26 DIAGNOSIS — D72829 Elevated white blood cell count, unspecified: Secondary | ICD-10-CM

## 2018-01-26 DIAGNOSIS — R0602 Shortness of breath: Secondary | ICD-10-CM | POA: Diagnosis not present

## 2018-01-26 DIAGNOSIS — Z5111 Encounter for antineoplastic chemotherapy: Secondary | ICD-10-CM | POA: Diagnosis not present

## 2018-01-26 DIAGNOSIS — C3481 Malignant neoplasm of overlapping sites of right bronchus and lung: Secondary | ICD-10-CM | POA: Diagnosis not present

## 2018-01-26 DIAGNOSIS — C3491 Malignant neoplasm of unspecified part of right bronchus or lung: Secondary | ICD-10-CM | POA: Diagnosis not present

## 2018-01-26 DIAGNOSIS — R062 Wheezing: Secondary | ICD-10-CM

## 2018-01-26 LAB — CBC WITH DIFFERENTIAL/PLATELET
BASOS ABS: 0.1 10*3/uL (ref 0–0.1)
BASOS PCT: 1 %
Eosinophils Absolute: 0.2 10*3/uL (ref 0–0.7)
Eosinophils Relative: 3 %
HCT: 34 % — ABNORMAL LOW (ref 35.0–47.0)
Hemoglobin: 11.5 g/dL — ABNORMAL LOW (ref 12.0–16.0)
Lymphocytes Relative: 7 %
Lymphs Abs: 0.6 10*3/uL — ABNORMAL LOW (ref 1.0–3.6)
MCH: 31.2 pg (ref 26.0–34.0)
MCHC: 33.7 g/dL (ref 32.0–36.0)
MCV: 92.5 fL (ref 80.0–100.0)
MONO ABS: 0.5 10*3/uL (ref 0.2–0.9)
MONOS PCT: 7 %
Neutro Abs: 6.9 10*3/uL — ABNORMAL HIGH (ref 1.4–6.5)
Neutrophils Relative %: 84 %
PLATELETS: 161 10*3/uL (ref 150–440)
RBC: 3.67 MIL/uL — ABNORMAL LOW (ref 3.80–5.20)
RDW: 14.7 % — AB (ref 11.5–14.5)
WBC: 8.3 10*3/uL (ref 3.6–11.0)

## 2018-01-26 LAB — COMPREHENSIVE METABOLIC PANEL
ALBUMIN: 3.2 g/dL — AB (ref 3.5–5.0)
ALK PHOS: 79 U/L (ref 38–126)
ALT: 16 U/L (ref 0–44)
ANION GAP: 9 (ref 5–15)
AST: 19 U/L (ref 15–41)
BILIRUBIN TOTAL: 0.6 mg/dL (ref 0.3–1.2)
BUN: 17 mg/dL (ref 8–23)
CALCIUM: 9.2 mg/dL (ref 8.9–10.3)
CO2: 25 mmol/L (ref 22–32)
Chloride: 100 mmol/L (ref 98–111)
Creatinine, Ser: 0.73 mg/dL (ref 0.44–1.00)
GFR calc Af Amer: >60 mL/min (ref >60)
GFR calc non Af Amer: >60 mL/min (ref >60)
GLUCOSE: 257 mg/dL — AB (ref 70–99)
Potassium: 3.9 mmol/L (ref 3.5–5.1)
Sodium: 134 mmol/L — ABNORMAL LOW (ref 135–145)
TOTAL PROTEIN: 6.4 g/dL — AB (ref 6.5–8.1)

## 2018-01-26 MED ORDER — PREDNISONE 20 MG PO TABS: 20.0000 mg | ORAL_TABLET | Freq: Every day | ORAL | 0 refills | Status: DC

## 2018-01-26 MED ORDER — SODIUM CHLORIDE 0.9% FLUSH
10.0000 mL | Freq: Once | INTRAVENOUS | Status: AC
  Administered 2018-01-26: 10 mL via INTRAVENOUS
  Filled 2018-01-26: qty 10

## 2018-01-26 MED ORDER — HEPARIN SOD (PORK) LOCK FLUSH 100 UNIT/ML IV SOLN
500.0000 [IU] | Freq: Once | INTRAVENOUS | Status: AC
  Administered 2018-01-26: 500 [IU] via INTRAVENOUS
  Filled 2018-01-26: qty 5

## 2018-01-26 NOTE — Progress Notes (Signed)
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  female with PMH listed below who was referred to me for evaluation of neck mass. Patient reports having cough for about 2 to 3 months.  Started after a mild Upper respiratory infection, and becomes chronic.  Associate with throat discomfort.   Denies any hemoptysis, yellowish or greenish sputum. Nothing make cough better or worse.  She also noticed right neck lump for couple of weeks, the neck lump is tender.  Associated with right side neck,  and right side headache.  Denies any vision changes.Her appetite has decreased for the past couple of months and lost  a few pounds. Denies any night sweating, fever or chills. She is a never smoker, husband  and used to smoke at home. Denies any known family history of cancer.  Data Reviewed: 12/20/2017 CT neck soft tissue with contrast showed palpable abnormality represent a necrotic right supraclavicular lymph node measuring 2.2 cm in diameter.  This is the tip of the iceberg as there is massive necrotic lymphadenopathy in the right hilum and mediastinum, starting to be malignant.  Presumed lung primary is not demonstrated in the upper half of the chest.  # Patient's case were discussed on tumor board on 01/06/2018.  Per pathology, this is most consistent with non-small cell lung cancer, poorly differentiated..  I called Dr.Onley today and she confirmed that additional IHC stains all came back negative for lymphoma.   INTERVAL HISTORY Dana Ramirez 67 y.o.  female with above history who presents for evaluation prior to cycle 3 concurrent chemotherapy treatment with carbotaxol for stage III non-small cell lung cancer.  Problems and complaints are listed below: Chemotherapy induced nausea: Tolerate cycle 2 carbo and Taxol well.  Denies nausea vomiting.   Neoplasm related fatigue: Mild fatigue, chronic.  Stable. Neoplasm related pain scale: Denies any pain. Opioid associated constipation: Not on any narcotics.   Shortness of breath with wheezing, slightly better.  Still wheezing.  On prednisone 20 mg daily.  Review of Systems  Constitutional: Positive for malaise/fatigue. Negative for chills, fever and weight loss.  HENT: Negative for congestion, ear discharge, ear pain, nosebleeds, sinus pain and sore throat.   Eyes: Negative for double vision, photophobia, pain, discharge and redness.  Respiratory: Positive for cough and wheezing. Negative for hemoptysis, sputum production and shortness of breath.   Cardiovascular: Negative for chest pain, palpitations, orthopnea, claudication and leg swelling.  Gastrointestinal: Negative for abdominal pain, blood in stool, constipation, diarrhea, heartburn, melena, nausea and vomiting.  Genitourinary: Negative for dysuria, flank pain, frequency and hematuria.  Musculoskeletal: Negative for back pain, myalgias and neck pain.  Skin: Negative for itching and rash.  Neurological: Negative for dizziness, tingling, tremors, focal weakness, weakness and headaches.  Endo/Heme/Allergies: Negative for environmental allergies. Does not bruise/bleed easily.  Psychiatric/Behavioral: Negative for depression and hallucinations. The patient is not nervous/anxious.     MEDICAL HISTORY:  Past Medical History:  Diagnosis Date  . CAD (coronary artery disease)   . Carotid arterial disease ([place])   . Diabetes mellitus without complication ([place])   .  History of vertigo   . Hyperlipidemia   . Hypertension   .  Kidney stone     SURGICAL HISTORY: Past Surgical History:  Procedure Laterality Date  . BREAST CYST ASPIRATION Left 1980s  . CESAREAN SECTION     x 2  . CHOLECYSTECTOMY    . CORONARY ARTERY BYPASS GRAFT  03/2005   LIMA to the LAD, vein graft to diagonal with a negative stress test in 06/2006  . PORTACATH PLACEMENT Left 12/31/2017   Procedure: INSERTION PORT-A-CATH;  Surgeon: Robert Bellow, MD;  Location: [facility] ORS;  Service: General;  Laterality: Left;  . SENTINEL NODE BIOPSY Right 12/31/2017   Procedure: SUPRACLAVICULAR NODE BIOPSY;  Surgeon: Robert Bellow, MD;  Location: [facility] ORS;  Service: General;  Laterality: Right;  . TONSILLECTOMY      SOCIAL HISTORY: Social History   Socioeconomic History  . Marital status: Married    Spouse name: Elenore Rota   . Number of children: 2  . Years of education: Not on file  . Highest education level: Not on file  Occupational History  . Occupation: Retired     Fish farm manager: OTHER    Comment: [employer]  Social Needs  . Financial resource strain: Not on file  . Food insecurity:    Worry: Not on file    Inability: Not on file  . Transportation needs:    Medical: Not on file    Non-medical: Not on file  Tobacco Use  . Smoking status: Never Smoker  . Smokeless tobacco: Never Used  Substance and Sexual Activity  . Alcohol use: No  . Drug use: No  . Sexual activity: Not on file  Lifestyle  . Physical activity:    Days per week: Not on file    Minutes per session: Not on file  . Stress: Not on file  Relationships  . Social connections:    Talks on phone: Not on file    Gets together: Not on file    Attends religious service: Not on file    Active member of club or organization: Not on file    Attends meetings of clubs or organizations: Not on file    Relationship status: Not on file  . Intimate partner violence:    Fear of current or ex partner: Not on file    Emotionally abused: Not on file    Physically abused: Not on file     Forced sexual activity: Not on file  Other Topics Concern  . Not on file  Social History Narrative   Married   Gets regular exercise, once or twice every week    FAMILY HISTORY: Family History  Problem Relation Age of Onset  . Heart disease Mother   . Heart disease Father 78  . Stroke Sister 80  . Heart disease Sister   . Diabetes Brother   . Coronary artery disease Other        Strong family history of CAD and CABG  . Heart disease Maternal Grandmother   . Breast cancer Maternal Grandmother   . Heart disease Maternal Grandfather   . Heart disease Paternal Grandmother   . Breast cancer Paternal Grandmother 30  . Heart disease Paternal Grandfather     ALLERGIES:  is allergic to codeine; lisinopril; and penicillins.  MEDICATIONS:  Current Outpatient Medications  Medication Sig Dispense Refill  . acetaminophen (TYLENOL) 650 MG CR tablet Take 650 mg by mouth every 8 (eight) hours as needed for pain.    Marland Kitchen albuterol (  PROVENTIL HFA;VENTOLIN HFA) 108 (90 Base) MCG/ACT inhaler Inhale 2 puffs into the lungs every 6 (six) hours as needed for wheezing or shortness of breath. 1 Inhaler 2  . aspirin (ASPIR-LOW) 81 MG EC tablet Take 81 mg by mouth 2 (two) times daily.     . Cholecalciferol (VITAMIN D3) 1000 units CAPS Take 1 capsule by mouth daily.    . cyanocobalamin 1000 MCG tablet Take 1,000 mcg by mouth daily.    . cyclobenzaprine (FLEXERIL) 10 MG tablet Take 1 tablet (10 mg total) by mouth 3 (three) times daily as needed for muscle spasms. 30 tablet 0  . glipiZIDE (GLUCOTROL XL) 2.5 MG 24 hr tablet TAKE ONE TABLET BY MOUTH EVERY MORNING WITH BREAKFAST 90 tablet 1  . JANUVIA 100 MG tablet TAKE ONE TABLET BY MOUTH DAILY 90 tablet 1  . lidocaine-prilocaine (EMLA) cream Apply to affected area once 30 g 3  . LORazepam (ATIVAN) 0.5 MG tablet Take 1 tablet (0.5 mg total) by mouth every 8 (eight) hours as needed for anxiety (nausea). 30 tablet 0  . metFORMIN (GLUCOPHAGE) 1000 MG tablet TAKE  ONE TABLET BY MOUTH DAILY 90 tablet 1  . metoprolol succinate (TOPROL-XL) 25 MG 24 hr tablet TAKE 1 TABLET DAILY 90 tablet 3  . omeprazole (PRILOSEC OTC) 20 MG tablet Take 20 mg by mouth daily.    . ondansetron (ZOFRAN) 8 MG tablet Take 1 tablet (8 mg total) by mouth every 8 (eight) hours as needed for nausea or vomiting. 30 tablet 1  . ondansetron (ZOFRAN) 8 MG tablet Take 1 tablet (8 mg total) by mouth 2 (two) times daily as needed for refractory nausea / vomiting. Start on day 3 after chemo. 30 tablet 1  . predniSONE (DELTASONE) 20 MG tablet Take 1 tablet (20 mg total) by mouth daily with breakfast. 30 tablet 0  . prochlorperazine (COMPAZINE) 10 MG tablet Take 1 tablet (10 mg total) by mouth every 6 (six) hours as needed (Nausea or vomiting). 30 tablet 1  . rosuvastatin (CRESTOR) 20 MG tablet TAKE ONE TABLET BY MOUTH DAILY 90 tablet 1  . traMADol (ULTRAM) 50 MG tablet Take 1 tablet (50 mg total) by mouth every 6 (six) hours as needed. (Patient not taking: Reported on 01/19/2018) 30 tablet 0   No current facility-administered medications for this visit.      PHYSICAL EXAMINATION: ECOG PERFORMANCE STATUS: 1 - Symptomatic but completely ambulatory Vitals:   01/26/18 1110  BP: 97/62  Pulse: 93  Resp: 18  Temp: (!) 96.5 F (35.8 C)  SpO2: 98%   Filed Weights   01/26/18 1110  Weight: 147 lb 9.6 oz (67 kg)    Physical Exam  Constitutional: She is oriented to person, place, and time. She appears well-developed and well-nourished. No distress.  HENT:  Head: Normocephalic and atraumatic.  Right Ear: External ear normal.  Left Ear: External ear normal.  Mouth/Throat: Oropharynx is clear and moist.  Eyes: Pupils are equal, round, and reactive to light. Conjunctivae and EOM are normal. No scleral icterus.  Neck: Normal range of motion. Neck supple.  Right supra clavicular lymphadenopathy,Status post excisional biopsy.  Cardiovascular: Normal rate, regular rhythm and normal heart sounds.    Pulmonary/Chest: Effort normal. No respiratory distress. She has wheezes. She has no rales. She exhibits no tenderness.  Abdominal: Soft. Bowel sounds are normal. She exhibits no distension and no mass. There is no tenderness.  Musculoskeletal: Normal range of motion. She exhibits no edema or deformity.  Lymphadenopathy:  She has no cervical adenopathy.  Neurological: She is alert and oriented to person, place, and time. No cranial nerve deficit. Coordination normal.  Skin: Skin is warm and dry. No rash noted.  Psychiatric: She has a normal mood and affect. Her behavior is normal. Thought content normal.     LABORATORY DATA:  I have reviewed the data as listed Lab Results  Component Value Date   WBC 8.3 01/26/2018   HGB 11.5 (L) 01/26/2018   HCT 34.0 (L) 01/26/2018   MCV 92.5 01/26/2018   PLT 161 01/26/2018   Recent Labs    01/12/18 0820 01/19/18 0819 01/26/18 1018  NA 137 137 134*  K 4.0 3.7 3.9  CL 99 103 100  CO2 27 24 25   GLUCOSE 127* 196* 257*  BUN 26* 25* 17  CREATININE 0.68 0.85 0.73  CALCIUM 9.6 9.6 9.2  GFRNONAA >60 >60 >60  GFRAA >60 >60 >60  PROT 7.1 7.0 6.4*  ALBUMIN 3.3* 3.3* 3.2*  AST 14* 24 19  ALT 11 14 16   ALKPHOS 85 84 79  BILITOT 0.4 0.5 0.6       ASSESSMENT & PLAN:  1. Malignant neoplasm of overlapping sites of right lung ([place])   2. Supraclavicular lymphadenopathy   3. Encounter for antineoplastic chemotherapy   4. SOB (shortness of breath)   5. Wheezing   6. Leukocytosis, unspecified type    #Non-small cell lung cancer, cTx N3M0, Stage III: massive necrotizing neck/mediastinum/hilum lymphadenopathy. Currently on concurrent chemoradiation.  Tolerates chemoradiation well.  Manageable nausea and vomiting. Labs are reviewed and discussed with patient and her daughter.  Acceptable to proceed with today's carbotaxol. Continue follow-up with radiation oncology for daily radiation.  # Due to the aggressivity of her disease, we have sent  cancer type and omniseq testing for molecular study # Wheezing: Due to lung neoplasm.  Continue prednisone 20 mg daily with GI prophylaxis.  Advised patient that she may skip prednisone dose when she is here for chemotherapy as dexamethasone is being given as premeds prior to the chemotherapy.  Voices understanding.  # Anxiety: Manageable.  She has Ativan 0.5mg  Q8h as needed for nausea or anxiety. .   # Anemia; due to chemotherapy and radiation treatment.  Stable.  Monitor. #Leukocytosis, predominantly neutrophilia, secondary to prednisone use. All questions were answered to her satisfaction.  The patient knows to call the clinic with any problems questions or concerns.  Return of visit: 1 week for assessment prior to next cycle of chemotherapy. Total face to face encounter time for this patient visit was 25 min. >50% of the time was  spent in counseling and coordination of care.   Earlie Server, MD, PhD Hematology Oncology [facility] Pager- [phone] 01/26/2018

## 2018-01-26 NOTE — Progress Notes (Signed)
Patient here for follow up. BP 97/62, patient denies dizziness or lightheadedness.

## 2018-01-26 NOTE — Telephone Encounter (Signed)
Per [facility] - CancerType ID will be reported by 7/12. Per [facility], Alfonzo Beers will be reported around 7/22.

## 2018-01-27 ENCOUNTER — Inpatient Hospital Stay: Payer: Medicare Other

## 2018-01-27 ENCOUNTER — Encounter: Payer: Self-pay | Admitting: Oncology

## 2018-01-27 ENCOUNTER — Ambulatory Visit
Admission: RE | Admit: 2018-01-27 | Discharge: 2018-01-27 | Disposition: A | Discharge status: Home / Self Care | Payer: Medicare Other | Source: Ambulatory Visit | Attending: Radiation Oncology | Admitting: Radiation Oncology

## 2018-01-27 DIAGNOSIS — C3491 Malignant neoplasm of unspecified part of right bronchus or lung: Secondary | ICD-10-CM | POA: Diagnosis not present

## 2018-01-27 DIAGNOSIS — Z5111 Encounter for antineoplastic chemotherapy: Secondary | ICD-10-CM | POA: Diagnosis not present

## 2018-01-27 MED ORDER — DIPHENHYDRAMINE HCL 50 MG/ML IJ SOLN
50.0000 mg | Freq: Once | INTRAMUSCULAR | Status: AC
  Administered 2018-01-27: 50 mg via INTRAVENOUS
  Filled 2018-01-27: qty 1

## 2018-01-27 MED ORDER — HEPARIN SOD (PORK) LOCK FLUSH 100 UNIT/ML IV SOLN
500.0000 [IU] | Freq: Once | INTRAVENOUS | Status: AC | PRN
  Administered 2018-01-27: 500 [IU]
  Filled 2018-01-27: qty 5

## 2018-01-27 MED ORDER — SODIUM CHLORIDE 0.9 % IV SOLN
Freq: Once | INTRAVENOUS | Status: AC
  Administered 2018-01-27: 09:00:00 via INTRAVENOUS
  Filled 2018-01-27: qty 1000

## 2018-01-27 MED ORDER — SODIUM CHLORIDE 0.9 % IV SOLN
20.0000 mg | Freq: Once | INTRAVENOUS | Status: AC
  Administered 2018-01-27: 20 mg via INTRAVENOUS
  Filled 2018-01-27: qty 2

## 2018-01-27 MED ORDER — FAMOTIDINE IN NACL 20-0.9 MG/50ML-% IV SOLN
20.0000 mg | Freq: Once | INTRAVENOUS | Status: AC
  Administered 2018-01-27: 20 mg via INTRAVENOUS
  Filled 2018-01-27: qty 50

## 2018-01-27 MED ORDER — SODIUM CHLORIDE 0.9 % IV SOLN
171.4000 mg | Freq: Once | INTRAVENOUS | Status: AC
  Administered 2018-01-27: 170 mg via INTRAVENOUS
  Filled 2018-01-27: qty 17

## 2018-01-27 MED ORDER — SODIUM CHLORIDE 0.9 % IV SOLN
45.0000 mg/m2 | Freq: Once | INTRAVENOUS | Status: AC
  Administered 2018-01-27: 78 mg via INTRAVENOUS
  Filled 2018-01-27: qty 13

## 2018-01-27 MED ORDER — PALONOSETRON HCL INJECTION 0.25 MG/5ML
0.2500 mg | Freq: Once | INTRAVENOUS | Status: AC
  Administered 2018-01-27: 0.25 mg via INTRAVENOUS
  Filled 2018-01-27: qty 5

## 2018-01-27 MED ORDER — SODIUM CHLORIDE 0.9% FLUSH
10.0000 mL | INTRAVENOUS | Status: DC | PRN
  Administered 2018-01-27: 10 mL
  Filled 2018-01-27: qty 10

## 2018-01-28 ENCOUNTER — Inpatient Hospital Stay (HOSPITAL_BASED_OUTPATIENT_CLINIC_OR_DEPARTMENT_OTHER): Payer: Medicare Other | Admitting: Oncology

## 2018-01-28 ENCOUNTER — Ambulatory Visit
Admission: RE | Admit: 2018-01-28 | Discharge: 2018-01-28 | Disposition: A | Discharge status: Home / Self Care | Payer: Medicare Other | Source: Ambulatory Visit | Attending: Radiation Oncology | Admitting: Radiation Oncology

## 2018-01-28 ENCOUNTER — Encounter: Payer: Self-pay | Admitting: General Surgery

## 2018-01-28 VITALS — BP 113/73 | HR 89 | Temp 97.9°F | Resp 18

## 2018-01-28 DIAGNOSIS — R062 Wheezing: Secondary | ICD-10-CM

## 2018-01-28 DIAGNOSIS — D72829 Elevated white blood cell count, unspecified: Secondary | ICD-10-CM

## 2018-01-28 DIAGNOSIS — D6481 Anemia due to antineoplastic chemotherapy: Secondary | ICD-10-CM | POA: Diagnosis not present

## 2018-01-28 DIAGNOSIS — R0602 Shortness of breath: Secondary | ICD-10-CM

## 2018-01-28 DIAGNOSIS — R51 Headache: Secondary | ICD-10-CM

## 2018-01-28 DIAGNOSIS — R590 Localized enlarged lymph nodes: Secondary | ICD-10-CM

## 2018-01-28 DIAGNOSIS — F419 Anxiety disorder, unspecified: Secondary | ICD-10-CM

## 2018-01-28 DIAGNOSIS — R221 Localized swelling, mass and lump, neck: Secondary | ICD-10-CM

## 2018-01-28 DIAGNOSIS — C3481 Malignant neoplasm of overlapping sites of right bronchus and lung: Secondary | ICD-10-CM | POA: Diagnosis not present

## 2018-01-28 DIAGNOSIS — C3491 Malignant neoplasm of unspecified part of right bronchus or lung: Secondary | ICD-10-CM | POA: Diagnosis not present

## 2018-01-29 ENCOUNTER — Encounter: Payer: Self-pay | Admitting: Oncology

## 2018-01-29 NOTE — Progress Notes (Signed)
Hematology/Oncology Consult note [facility] [phone] [phone]   Patient Care Team: Crecencio Mc, MD as PCP - General (Internal Medicine) Minna Merritts, MD (Cardiology) Telford Nab, RN as Registered Nurse Byrnett, Forest Gleason, MD (General Surgery)  REFERRING PROVIDER: Crecencio Mc, MD REASON FOR VISIT Follow up for treatment of Non-small cell lung cancer, cTx N3M0, Stage III: To discuss new results and management plan. HISTORY OF PRESENTING ILLNESS:  Dana Ramirez is a  66 y.o.  female with PMH listed below who was referred to me for evaluation of neck mass. Patient reports having cough for about 2 to 3 months.  Started after a mild Upper respiratory infection, and becomes chronic.  Associate with throat discomfort.   Denies any hemoptysis, yellowish or greenish sputum. Nothing make cough better or worse.  She also noticed right neck lump for couple of weeks, the neck lump is tender.  Associated with right side neck,  and right side headache.  Denies any vision changes.Her appetite has decreased for the past couple of months and lost  a few pounds. Denies any night sweating, fever or chills. She is a never smoker, husband  and used to smoke at home. Denies any known family history of cancer.  Data Reviewed: 12/20/2017 CT neck soft tissue with contrast showed palpable abnormality represent a necrotic right supraclavicular lymph node measuring 2.2 cm in diameter.  This is the tip of the iceberg as there is massive necrotic lymphadenopathy in the right hilum and mediastinum, starting to be malignant.  Presumed lung primary is not demonstrated in the upper half of the chest.  # Patient's case were discussed on tumor board on 01/06/2018.  Per pathology, this is most consistent with non-small cell lung cancer, poorly differentiated..  I called Dr.Onley today and she confirmed that additional IHC stains all came back negative for  lymphoma.   INTERVAL HISTORY Dana Ramirez 67 y.o.  female with above history who presents for discussion of cancer type ID results. Patient currently is on concurrent chemoradiation with [place] and Taxol weekly.  Tolerates well. 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  Patient present to discuss about result and management plan.  Review of Systems  Constitutional: Positive for malaise/fatigue. Negative for chills, fever and weight loss.  HENT: Negative for congestion, ear discharge, ear pain, nosebleeds, sinus pain and sore throat.   Eyes: Negative for double vision, photophobia, pain, discharge and redness.  Respiratory: Positive for cough and wheezing. Negative for hemoptysis, sputum production and shortness of breath.   Cardiovascular: Negative for chest pain, palpitations, orthopnea, claudication and leg swelling.  Gastrointestinal: Negative for abdominal pain, blood in stool, constipation, diarrhea, heartburn, melena, nausea and vomiting.  Genitourinary: Negative for dysuria, flank pain, frequency and hematuria.  Musculoskeletal: Negative for back pain, myalgias and neck pain.  Skin: Negative for itching and rash.  Neurological: Negative for dizziness, tingling, tremors, focal weakness, weakness and headaches.  Endo/Heme/Allergies: Negative for environmental allergies. Does not bruise/bleed easily.  Psychiatric/Behavioral: Negative for depression and hallucinations. The patient is not nervous/anxious.     MEDICAL HISTORY:  Past Medical History:  Diagnosis Date  . CAD (coronary artery disease)   . Carotid arterial disease ([place])   . Diabetes mellitus without complication ([place])   . History of vertigo   . Hyperlipidemia   . Hypertension   . Kidney stone     SURGICAL HISTORY: Past Surgical History:  Procedure Laterality Date  . BREAST CYST  ASPIRATION Left 1980s  . CESAREAN SECTION     x 2  . CHOLECYSTECTOMY    . CORONARY ARTERY BYPASS GRAFT  03/2005    LIMA to the LAD, vein graft to diagonal with a negative stress test in 06/2006  . PORTACATH PLACEMENT Left 12/31/2017   Procedure: INSERTION PORT-A-CATH;  Surgeon: Robert Bellow, MD;  Location: [facility] ORS;  Service: General;  Laterality: Left;  . SENTINEL NODE BIOPSY Right 12/31/2017   Procedure: SUPRACLAVICULAR NODE BIOPSY;  Surgeon: Robert Bellow, MD;  Location: [facility] ORS;  Service: General;  Laterality: Right;  . TONSILLECTOMY      SOCIAL HISTORY: Social History   Socioeconomic History  . Marital status: Married    Spouse name: Elenore Rota   . Number of children: 2  . Years of education: Not on file  . Highest education level: Not on file  Occupational History  . Occupation: Retired     Fish farm manager: OTHER    Comment: [employer]  Social Needs  . Financial resource strain: Not on file  . Food insecurity:    Worry: Not on file    Inability: Not on file  . Transportation needs:    Medical: Not on file    Non-medical: Not on file  Tobacco Use  . Smoking status: Never Smoker  . Smokeless tobacco: Never Used  Substance and Sexual Activity  . Alcohol use: No  . Drug use: No  . Sexual activity: Not on file  Lifestyle  . Physical activity:    Days per week: Not on file    Minutes per session: Not on file  . Stress: Not on file  Relationships  . Social connections:    Talks on phone: Not on file    Gets together: Not on file    Attends religious service: Not on file    Active member of club or organization: Not on file    Attends meetings of clubs or organizations: Not on file    Relationship status: Not on file  . Intimate partner violence:    Fear of current or ex partner: Not on file    Emotionally abused: Not on file    Physically abused: Not on file    Forced sexual activity: Not on file  Other Topics Concern  . Not on file  Social History Narrative   Married   Gets regular exercise, once or twice every week    FAMILY HISTORY: Family History  Problem Relation Age of  Onset  . Heart disease Mother   . Heart disease Father 74  . Stroke Sister 33  . Heart disease Sister   . Diabetes Brother   . Coronary artery disease Other        Strong family history of CAD and CABG  . Heart disease Maternal Grandmother   . Breast cancer Maternal Grandmother   . Heart disease Maternal Grandfather   . Heart disease Paternal Grandmother   . Breast cancer Paternal Grandmother 77  . Heart disease Paternal Grandfather     ALLERGIES:  is allergic to codeine; lisinopril; and penicillins.  MEDICATIONS:  Current Outpatient Medications  Medication Sig Dispense Refill  . acetaminophen (TYLENOL) 650 MG CR tablet Take 650 mg by mouth every 8 (eight) hours as needed for pain.    Marland Kitchen albuterol (PROVENTIL HFA;VENTOLIN HFA) 108 (90 Base) MCG/ACT inhaler Inhale 2 puffs into the lungs every 6 (six) hours as needed for wheezing or shortness of breath. 1 Inhaler 2  . aspirin (ASPIR-LOW)  81 MG EC tablet Take 81 mg by mouth 2 (two) times daily.     . Cholecalciferol (VITAMIN D3) 1000 units CAPS Take 1 capsule by mouth daily.    . cyanocobalamin 1000 MCG tablet Take 1,000 mcg by mouth daily.    . cyclobenzaprine (FLEXERIL) 10 MG tablet Take 1 tablet (10 mg total) by mouth 3 (three) times daily as needed for muscle spasms. 30 tablet 0  . glipiZIDE (GLUCOTROL XL) 2.5 MG 24 hr tablet TAKE ONE TABLET BY MOUTH EVERY MORNING WITH BREAKFAST 90 tablet 1  . JANUVIA 100 MG tablet TAKE ONE TABLET BY MOUTH DAILY 90 tablet 1  . lidocaine-prilocaine (EMLA) cream Apply to affected area once 30 g 3  . LORazepam (ATIVAN) 0.5 MG tablet Take 1 tablet (0.5 mg total) by mouth every 8 (eight) hours as needed for anxiety (nausea). 30 tablet 0  . metFORMIN (GLUCOPHAGE) 1000 MG tablet TAKE ONE TABLET BY MOUTH DAILY 90 tablet 1  . metoprolol succinate (TOPROL-XL) 25 MG 24 hr tablet TAKE 1 TABLET DAILY 90 tablet 3  . omeprazole (PRILOSEC OTC) 20 MG tablet Take 20 mg by mouth daily.    . ondansetron (ZOFRAN) 8 MG  tablet Take 1 tablet (8 mg total) by mouth every 8 (eight) hours as needed for nausea or vomiting. 30 tablet 1  . ondansetron (ZOFRAN) 8 MG tablet Take 1 tablet (8 mg total) by mouth 2 (two) times daily as needed for refractory nausea / vomiting. Start on day 3 after chemo. 30 tablet 1  . predniSONE (DELTASONE) 20 MG tablet Take 1 tablet (20 mg total) by mouth daily with breakfast. 30 tablet 0  . prochlorperazine (COMPAZINE) 10 MG tablet Take 1 tablet (10 mg total) by mouth every 6 (six) hours as needed (Nausea or vomiting). 30 tablet 1  . rosuvastatin (CRESTOR) 20 MG tablet TAKE ONE TABLET BY MOUTH DAILY 90 tablet 1  . traMADol (ULTRAM) 50 MG tablet Take 1 tablet (50 mg total) by mouth every 6 (six) hours as needed. (Patient not taking: Reported on 01/28/2018) 30 tablet 0   No current facility-administered medications for this visit.      PHYSICAL EXAMINATION: ECOG PERFORMANCE STATUS: 1 - Symptomatic but completely ambulatory Vitals:   01/28/18 1137  BP: 113/73  Pulse: 89  Resp: 18  Temp: 97.9 F (36.6 C)  SpO2: 98%   There were no vitals filed for this visit.  Physical Exam  Constitutional: She is oriented to person, place, and time. She appears well-developed and well-nourished. No distress.  HENT:  Head: Normocephalic and atraumatic.  Right Ear: External ear normal.  Left Ear: External ear normal.  Mouth/Throat: Oropharynx is clear and moist.  Eyes: Pupils are equal, round, and reactive to light. Conjunctivae and EOM are normal. No scleral icterus.  Neck: Normal range of motion. Neck supple.  Right supra clavicular lymphadenopathy,Status post excisional biopsy.  Cardiovascular: Normal rate, regular rhythm and normal heart sounds.  Pulmonary/Chest: Effort normal. No respiratory distress. She has wheezes. She has no rales. She exhibits no tenderness.  Abdominal: Soft. Bowel sounds are normal. She exhibits no distension and no mass. There is no tenderness.  Musculoskeletal:  Normal range of motion. She exhibits no edema or deformity.  Lymphadenopathy:    She has no cervical adenopathy.  Neurological: She is alert and oriented to person, place, and time. No cranial nerve deficit. Coordination normal.  Skin: Skin is warm and dry. No rash noted.  Psychiatric: She has a normal mood and  affect. Her behavior is normal. Thought content normal.      LABORATORY DATA:  I have reviewed the data as listed Lab Results  Component Value Date   WBC 8.3 01/26/2018   HGB 11.5 (L) 01/26/2018   HCT 34.0 (L) 01/26/2018   MCV 92.5 01/26/2018   PLT 161 01/26/2018   Recent Labs    01/12/18 0820 01/19/18 0819 01/26/18 1018  NA 137 137 134*  K 4.0 3.7 3.9  CL 99 103 100  CO2 27 24 25   GLUCOSE 127* 196* 257*  BUN 26* 25* 17  CREATININE 0.68 0.85 0.73  CALCIUM 9.6 9.6 9.2  GFRNONAA >60 >60 >60  GFRAA >60 >60 >60  PROT 7.1 7.0 6.4*  ALBUMIN 3.3* 3.3* 3.2*  AST 14* 24 19  ALT 11 14 16   ALKPHOS 85 84 79  BILITOT 0.4 0.5 0.6       ASSESSMENT & PLAN:  1. Supraclavicular lymphadenopathy   2. Malignant neoplasm of overlapping sites of right lung (HCC)    #Non-small cell lung neoplasm, cTx N3M0, Stage III: massive necrotizing neck/mediastinum/hilum lymphadenopathy.  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 Discussed with patient and family members that clinically patient appears to have a high-grade non-small cell lung cancer.  For now continue concurrent weekly treatment of carbotaxol with radiation. Meanwhile I will refer patient to see [facility] oncology Dr. Durenda Hurt for second opinion. NGS has been sent, results pending.   Patient and family members have questions and I answered to satisfaction. Total face to face encounter time for this patient visit was 15 min. >50% of the time was  spent in counseling and coordination of care.     Earlie Server, MD, PhD Hematology Oncology [facility] at [facility] Pager- [phone] 01/29/2018

## 2018-01-30 ENCOUNTER — Encounter: Payer: Self-pay | Admitting: Oncology

## 2018-01-31 ENCOUNTER — Ambulatory Visit
Admission: RE | Admit: 2018-01-31 | Discharge: 2018-01-31 | Disposition: A | Discharge status: Home / Self Care | Payer: Medicare Other | Source: Ambulatory Visit | Attending: Radiation Oncology | Admitting: Radiation Oncology

## 2018-01-31 ENCOUNTER — Telehealth: Payer: Self-pay | Admitting: *Deleted

## 2018-01-31 ENCOUNTER — Encounter: Payer: Self-pay | Admitting: Oncology

## 2018-01-31 ENCOUNTER — Telehealth: Payer: Self-pay | Admitting: Cardiovascular Disease

## 2018-01-31 ENCOUNTER — Other Ambulatory Visit: Payer: Self-pay | Admitting: Internal Medicine

## 2018-01-31 DIAGNOSIS — C3491 Malignant neoplasm of unspecified part of right bronchus or lung: Secondary | ICD-10-CM | POA: Diagnosis not present

## 2018-01-31 DIAGNOSIS — I10 Essential (primary) hypertension: Secondary | ICD-10-CM

## 2018-01-31 DIAGNOSIS — I2510 Atherosclerotic heart disease of native coronary artery without angina pectoris: Secondary | ICD-10-CM

## 2018-01-31 DIAGNOSIS — Z01818 Encounter for other preprocedural examination: Secondary | ICD-10-CM

## 2018-01-31 NOTE — Telephone Encounter (Signed)
Dana Ramirez, any idea if her appointment is being scheduled at [facility] with [place]. Thanks.

## 2018-01-31 NOTE — Telephone Encounter (Signed)
Referral was sent Friday afternoon... From my experience it usually takes longer than 24 business hours to contact the patient with an appt.

## 2018-01-31 NOTE — Telephone Encounter (Signed)
Patient returning call.

## 2018-01-31 NOTE — Telephone Encounter (Signed)
Pt states she was diagnosed with lung cancer and would like to discuss if treatment will affect anything with her heart. Please call to discuss.

## 2018-01-31 NOTE — Telephone Encounter (Signed)
I called and spoke with the patient. She states that she has been taking radiation 5 days a week and chemo 1 day a week - she just finished up her 3rd week of treatment. 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 She would like Dr. Donivan Scull feedback on what the risk of treatment is to her heart and any conflicts with her current medications.   I advised I will forward to Dr. Rockey Situ for review and we will be back in touch with her. She voices understanding and is agreeable.

## 2018-01-31 NOTE — Telephone Encounter (Signed)
Daughter called wanting to know if her mother's appointment had been scheduled with [facility].

## 2018-01-31 NOTE — Telephone Encounter (Signed)
No answer. Left message to call back.   

## 2018-02-01 ENCOUNTER — Telehealth: Payer: Self-pay | Admitting: *Deleted

## 2018-02-01 ENCOUNTER — Ambulatory Visit
Admission: RE | Admit: 2018-02-01 | Discharge: 2018-02-01 | Disposition: A | Discharge status: Home / Self Care | Payer: Medicare Other | Source: Ambulatory Visit | Attending: Radiation Oncology | Admitting: Radiation Oncology

## 2018-02-01 DIAGNOSIS — C3491 Malignant neoplasm of unspecified part of right bronchus or lung: Secondary | ICD-10-CM | POA: Diagnosis not present

## 2018-02-01 NOTE — Telephone Encounter (Signed)
tumor on the right side and radiation beam will avoid much of the heart, though we will need to monitor closely in the years to come Would recommend baseline echocardiogram as none in our system That way if there is a problem in the future we will be able to compare studies before and after treatments Oncology department can likely guide her whether any symptoms she is feeling is normal for chemotherapy or abnormal

## 2018-02-01 NOTE — Telephone Encounter (Signed)
Patient's daughter called to request that [facility] appointment be moved to a sooner date. Please call her.

## 2018-02-02 ENCOUNTER — Ambulatory Visit
Admission: RE | Admit: 2018-02-02 | Discharge: 2018-02-02 | Disposition: A | Discharge status: Home / Self Care | Payer: Medicare Other | Source: Ambulatory Visit | Attending: Radiation Oncology | Admitting: Radiation Oncology

## 2018-02-02 ENCOUNTER — Encounter: Payer: Self-pay | Admitting: Oncology

## 2018-02-02 ENCOUNTER — Inpatient Hospital Stay: Payer: Medicare Other

## 2018-02-02 ENCOUNTER — Inpatient Hospital Stay (HOSPITAL_BASED_OUTPATIENT_CLINIC_OR_DEPARTMENT_OTHER): Payer: Medicare Other | Admitting: Oncology

## 2018-02-02 ENCOUNTER — Other Ambulatory Visit: Payer: Self-pay

## 2018-02-02 VITALS — BP 105/69 | HR 98 | Temp 98.0°F | Resp 18 | Wt 145.9 lb

## 2018-02-02 DIAGNOSIS — C3481 Malignant neoplasm of overlapping sites of right bronchus and lung: Secondary | ICD-10-CM | POA: Diagnosis not present

## 2018-02-02 DIAGNOSIS — D6481 Anemia due to antineoplastic chemotherapy: Secondary | ICD-10-CM | POA: Diagnosis not present

## 2018-02-02 DIAGNOSIS — R221 Localized swelling, mass and lump, neck: Secondary | ICD-10-CM

## 2018-02-02 DIAGNOSIS — C3491 Malignant neoplasm of unspecified part of right bronchus or lung: Secondary | ICD-10-CM

## 2018-02-02 DIAGNOSIS — R590 Localized enlarged lymph nodes: Secondary | ICD-10-CM

## 2018-02-02 DIAGNOSIS — R0602 Shortness of breath: Secondary | ICD-10-CM | POA: Diagnosis not present

## 2018-02-02 DIAGNOSIS — R062 Wheezing: Secondary | ICD-10-CM | POA: Diagnosis not present

## 2018-02-02 DIAGNOSIS — Z5111 Encounter for antineoplastic chemotherapy: Secondary | ICD-10-CM

## 2018-02-02 DIAGNOSIS — D72829 Elevated white blood cell count, unspecified: Secondary | ICD-10-CM

## 2018-02-02 DIAGNOSIS — F419 Anxiety disorder, unspecified: Secondary | ICD-10-CM

## 2018-02-02 LAB — CBC WITH DIFFERENTIAL/PLATELET
Basophils Absolute: 0 10*3/uL (ref 0–0.1)
Basophils Relative: 1 %
EOS ABS: 0.1 10*3/uL (ref 0–0.7)
EOS PCT: 2 %
HCT: 33.9 % — ABNORMAL LOW (ref 35.0–47.0)
Hemoglobin: 11.6 g/dL — ABNORMAL LOW (ref 12.0–16.0)
Lymphocytes Relative: 9 %
Lymphs Abs: 0.6 10*3/uL — ABNORMAL LOW (ref 1.0–3.6)
MCH: 31.6 pg (ref 26.0–34.0)
MCHC: 34.3 g/dL (ref 32.0–36.0)
MCV: 92.2 fL (ref 80.0–100.0)
MONO ABS: 0.4 10*3/uL (ref 0.2–0.9)
MONOS PCT: 6 %
Neutro Abs: 5.2 10*3/uL (ref 1.4–6.5)
Neutrophils Relative %: 82 %
PLATELETS: 141 10*3/uL — AB (ref 150–440)
RBC: 3.67 MIL/uL — ABNORMAL LOW (ref 3.80–5.20)
RDW: 15.3 % — AB (ref 11.5–14.5)
WBC: 6.3 10*3/uL (ref 3.6–11.0)

## 2018-02-02 LAB — COMPREHENSIVE METABOLIC PANEL
ALBUMIN: 3.3 g/dL — AB (ref 3.5–5.0)
ALT: 19 U/L (ref 0–44)
AST: 21 U/L (ref 15–41)
Alkaline Phosphatase: 83 U/L (ref 38–126)
Anion gap: 13 (ref 5–15)
BUN: 23 mg/dL (ref 8–23)
CO2: 23 mmol/L (ref 22–32)
CREATININE: 0.71 mg/dL (ref 0.44–1.00)
Calcium: 9.3 mg/dL (ref 8.9–10.3)
Chloride: 102 mmol/L (ref 98–111)
GFR calc Af Amer: >60 mL/min (ref >60)
GLUCOSE: 175 mg/dL — AB (ref 70–99)
Potassium: 3.5 mmol/L (ref 3.5–5.1)
Sodium: 138 mmol/L (ref 135–145)
Total Bilirubin: 0.5 mg/dL (ref 0.3–1.2)
Total Protein: 6.6 g/dL (ref 6.5–8.1)

## 2018-02-02 MED ORDER — SODIUM CHLORIDE 0.9 % IV SOLN
171.4000 mg | Freq: Once | INTRAVENOUS | Status: AC
  Administered 2018-02-02: 170 mg via INTRAVENOUS
  Filled 2018-02-02: qty 17

## 2018-02-02 MED ORDER — SODIUM CHLORIDE 0.9 % IV SOLN
Freq: Once | INTRAVENOUS | Status: AC
  Administered 2018-02-02: 10:00:00 via INTRAVENOUS
  Filled 2018-02-02: qty 1000

## 2018-02-02 MED ORDER — PALONOSETRON HCL INJECTION 0.25 MG/5ML
0.2500 mg | Freq: Once | INTRAVENOUS | Status: AC
  Administered 2018-02-02: 0.25 mg via INTRAVENOUS
  Filled 2018-02-02: qty 5

## 2018-02-02 MED ORDER — FAMOTIDINE IN NACL 20-0.9 MG/50ML-% IV SOLN
20.0000 mg | Freq: Once | INTRAVENOUS | Status: AC
  Administered 2018-02-02: 20 mg via INTRAVENOUS
  Filled 2018-02-02: qty 50

## 2018-02-02 MED ORDER — HEPARIN SOD (PORK) LOCK FLUSH 100 UNIT/ML IV SOLN
500.0000 [IU] | Freq: Once | INTRAVENOUS | Status: AC
  Administered 2018-02-02: 500 [IU] via INTRAVENOUS
  Filled 2018-02-02: qty 5

## 2018-02-02 MED ORDER — SODIUM CHLORIDE 0.9 % IV SOLN
45.0000 mg/m2 | Freq: Once | INTRAVENOUS | Status: AC
  Administered 2018-02-02: 78 mg via INTRAVENOUS
  Filled 2018-02-02: qty 13

## 2018-02-02 MED ORDER — SODIUM CHLORIDE 0.9% FLUSH
10.0000 mL | INTRAVENOUS | Status: DC | PRN
  Administered 2018-02-02: 10 mL via INTRAVENOUS
  Filled 2018-02-02: qty 10

## 2018-02-02 MED ORDER — SODIUM CHLORIDE 0.9 % IV SOLN
20.0000 mg | Freq: Once | INTRAVENOUS | Status: AC
  Administered 2018-02-02: 20 mg via INTRAVENOUS
  Filled 2018-02-02: qty 2

## 2018-02-02 MED ORDER — HEPARIN SOD (PORK) LOCK FLUSH 100 UNIT/ML IV SOLN: 500.0000 [IU] | Freq: Once | INTRAVENOUS | Status: DC | PRN

## 2018-02-02 MED ORDER — DIPHENHYDRAMINE HCL 50 MG/ML IJ SOLN
50.0000 mg | Freq: Once | INTRAMUSCULAR | Status: AC
  Administered 2018-02-02: 50 mg via INTRAVENOUS
  Filled 2018-02-02: qty 1

## 2018-02-02 NOTE — Progress Notes (Signed)
Patient here for follow up. No concerns voiced.  °

## 2018-02-02 NOTE — Telephone Encounter (Signed)
Patient verbalized understanding of Dr Donivan Scull recommendations for echo and is agreeable. She is also due for 1 year f/u in September. Transferred to scheduler to schedule echo and appointment.

## 2018-02-02 NOTE — Progress Notes (Signed)
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  female with PMH listed below who was referred to me for evaluation of neck mass. Patient reports having cough for about 2 to 3 months.  Started after a mild Upper respiratory infection, and becomes chronic.  Associate with throat discomfort.   Denies any hemoptysis, yellowish or greenish sputum. Nothing make cough better or worse.  She also noticed right neck lump for couple of weeks, the neck lump is tender.  Associated with right side neck,  and right side headache.  Denies any vision changes.Her appetite has decreased for the past couple of months and lost  a few pounds. Denies any night sweating, fever or chills. She is a never smoker, husband  and used to smoke at home. Denies any known family history of cancer.  Data Reviewed: 12/20/2017 CT neck soft tissue with contrast showed palpable abnormality represent a necrotic right supraclavicular lymph node measuring 2.2 cm in diameter.  This is the tip of the iceberg as there is massive necrotic lymphadenopathy in the right hilum and mediastinum, starting to be malignant.  Presumed lung primary is not demonstrated in the upper half of the chest.  # Patient's case were discussed on tumor board on 01/06/2018.  Per pathology, this is most consistent with non-small cell lung cancer, poorly differentiated carcinoma..  I called Dr.Onley today and she confirmed that additional IHC stains all came back negative for lymphoma.   INTERVAL  HISTORY Dana Ramirez 67 y.o.  female with above history who presents for evaluation prior to cycle 4 concurrent chemotherapy with radiation  for treatment of neoplasm of lung.  Current working diagnosis is stage III non-small cell lung cancer. 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  This has been discussed with patient and her family members.  I have referred patient to see [facility] oncologist Dr. Durenda Hurt.  Patient has an appointment  this Friday. OmniSeq Advance test showed PD-L1 90% TPS.  No mutation identified for ALK, BRAF, EGFR, HER2, KRAS, MET, NTRK, RET fusion, ROS1.   Problems and complaints are listed below: Chemotherapy induced nausea: Denies Neoplasm related fatigue: Mild fatigue.  Chronic.  Stable. Neoplasm related pain scale: Denies any pain.Marland Kitchen Opioid associated constipation: Not taking any narcotics. Shortness of breath with wheezing, she is on prednisone 20 mg daily.  Wheezing is better.  Usually happens at night.  Review of Systems  Constitutional: Positive for malaise/fatigue. Negative for chills, fever and weight loss.  HENT: Negative for congestion, ear discharge, ear pain, nosebleeds, sinus pain and sore throat.   Eyes: Negative for double vision, photophobia, pain, discharge and redness.  Respiratory: Positive for wheezing. Negative for cough, hemoptysis, sputum production and shortness of breath.   Cardiovascular: Negative for chest pain, palpitations, orthopnea, claudication and leg swelling.  Gastrointestinal: Negative for abdominal pain, blood in stool, constipation, diarrhea, heartburn, melena, nausea and vomiting.  Genitourinary: Negative for dysuria, flank pain, frequency and hematuria.  Musculoskeletal: Negative for back pain, myalgias and neck pain.  Skin: Negative for itching and rash.  Neurological: Negative  for dizziness, tingling, tremors, focal weakness, weakness and headaches.  Endo/Heme/Allergies: Negative for  environmental allergies. Does not bruise/bleed easily.  Psychiatric/Behavioral: Negative for depression and hallucinations. The patient is not nervous/anxious.     MEDICAL HISTORY:  Past Medical History:  Diagnosis Date  . CAD (coronary artery disease)   . Carotid arterial disease ([place])   . Diabetes mellitus without complication ([place])   . History of vertigo   . Hyperlipidemia   . Hypertension   . Kidney stone     SURGICAL HISTORY: Past Surgical History:  Procedure Laterality Date  . BREAST CYST ASPIRATION Left 1980s  . CESAREAN SECTION     x 2  . CHOLECYSTECTOMY    . CORONARY ARTERY BYPASS GRAFT  03/2005   LIMA to the LAD, vein graft to diagonal with a negative stress test in 06/2006  . PORTACATH PLACEMENT Left 12/31/2017   Procedure: INSERTION PORT-A-CATH;  Surgeon: Robert Bellow, MD;  Location: [facility] ORS;  Service: General;  Laterality: Left;  . SENTINEL NODE BIOPSY Right 12/31/2017   Procedure: SUPRACLAVICULAR NODE BIOPSY;  Surgeon: Robert Bellow, MD;  Location: [facility] ORS;  Service: General;  Laterality: Right;  . TONSILLECTOMY      SOCIAL HISTORY: Social History   Socioeconomic History  . Marital status: Married    Spouse name: Dana Ramirez   . Number of children: 2  . Years of education: Not on file  . Highest education level: Not on file  Occupational History  . Occupation: Retired     Fish farm manager: OTHER    Comment: [employer]  Social Needs  . Financial resource strain: Not on file  . Food insecurity:    Worry: Not on file    Inability: Not on file  . Transportation needs:    Medical: Not on file    Non-medical: Not on file  Tobacco Use  . Smoking status: Never Smoker  . Smokeless tobacco: Never Used  Substance and Sexual Activity  . Alcohol use: No  . Drug use: No  . Sexual activity: Not on file  Lifestyle  . Physical activity:    Days per week: Not on file    Minutes per session: Not on file  . Stress: Not on file  Relationships  . Social connections:      Talks on phone: Not on file    Gets together: Not on file    Attends religious service: Not on file    Active member of club or organization: Not on file    Attends meetings of clubs or organizations: Not on file    Relationship status: Not on file  . Intimate partner violence:    Fear of current or ex partner: Not on file    Emotionally abused: Not on file    Physically abused: Not on file    Forced sexual activity: Not on file  Other Topics Concern  . Not on file  Social History Narrative   Married   Gets regular exercise, once or twice every week    FAMILY HISTORY: Family History  Problem Relation Age of Onset  . Heart disease Mother   . Heart disease Father 72  . Stroke Sister 77  . Heart disease Sister   . Diabetes Brother   . Coronary artery disease Other        Strong family history of CAD and CABG  . Heart disease Maternal Grandmother   . Breast cancer Maternal Grandmother   . Heart disease Maternal Grandfather   .  Heart disease Paternal Grandmother   . Breast cancer Paternal Grandmother 32  . Heart disease Paternal Grandfather     ALLERGIES:  is allergic to codeine; lisinopril; and penicillins.  MEDICATIONS:  Current Outpatient Medications  Medication Sig Dispense Refill  . acetaminophen (TYLENOL) 650 MG CR tablet Take 650 mg by mouth every 8 (eight) hours as needed for pain.    Marland Kitchen albuterol (PROVENTIL HFA;VENTOLIN HFA) 108 (90 Base) MCG/ACT inhaler Inhale 2 puffs into the lungs every 6 (six) hours as needed for wheezing or shortness of breath. 1 Inhaler 2  . aspirin (ASPIR-LOW) 81 MG EC tablet Take 81 mg by mouth 2 (two) times daily.     . Cholecalciferol (VITAMIN D3) 1000 units CAPS Take 1 capsule by mouth daily.    . cyanocobalamin 1000 MCG tablet Take 1,000 mcg by mouth daily.    . cyclobenzaprine (FLEXERIL) 10 MG tablet Take 1 tablet (10 mg total) by mouth 3 (three) times daily as needed for muscle spasms. 30 tablet 0  . glipiZIDE (GLUCOTROL XL) 2.5 MG  24 hr tablet TAKE ONE TABLET BY MOUTH EVERY MORNING WITH BREAKFAST 90 tablet 1  . JANUVIA 100 MG tablet TAKE ONE TABLET BY MOUTH DAILY 90 tablet 1  . lidocaine-prilocaine (EMLA) cream Apply to affected area once 30 g 3  . LORazepam (ATIVAN) 0.5 MG tablet Take 1 tablet (0.5 mg total) by mouth every 8 (eight) hours as needed for anxiety (nausea). 30 tablet 0  . metFORMIN (GLUCOPHAGE) 1000 MG tablet TAKE ONE TABLET BY MOUTH DAILY 90 tablet 1  . metoprolol succinate (TOPROL-XL) 25 MG 24 hr tablet TAKE 1 TABLET DAILY 90 tablet 3  . omeprazole (PRILOSEC OTC) 20 MG tablet Take 20 mg by mouth daily.    . ondansetron (ZOFRAN) 8 MG tablet Take 1 tablet (8 mg total) by mouth every 8 (eight) hours as needed for nausea or vomiting. 30 tablet 1  . ondansetron (ZOFRAN) 8 MG tablet Take 1 tablet (8 mg total) by mouth 2 (two) times daily as needed for refractory nausea / vomiting. Start on day 3 after chemo. 30 tablet 1  . predniSONE (DELTASONE) 20 MG tablet Take 1 tablet (20 mg total) by mouth daily with breakfast. 30 tablet 0  . prochlorperazine (COMPAZINE) 10 MG tablet Take 1 tablet (10 mg total) by mouth every 6 (six) hours as needed (Nausea or vomiting). 30 tablet 1  . rosuvastatin (CRESTOR) 20 MG tablet TAKE ONE TABLET BY MOUTH DAILY 90 tablet 1  . traMADol (ULTRAM) 50 MG tablet Take 1 tablet (50 mg total) by mouth every 6 (six) hours as needed. 30 tablet 0   No current facility-administered medications for this visit.    Facility-Administered Medications Ordered in Other Visits  Medication Dose Route Frequency Provider Last Rate Last Dose  . CARBOplatin (PARAPLATIN) 170 mg in sodium chloride 0.9 % 250 mL chemo infusion  170 mg Intravenous Once Earlie Server, MD      . dexamethasone (DECADRON) 20 mg in sodium chloride 0.9 % 50 mL IVPB  20 mg Intravenous Once Earlie Server, MD 208 mL/hr at 02/02/18 1044 20 mg at 02/02/18 1044  . heparin lock flush 100 unit/mL  500 Units Intravenous Once Earlie Server, MD      . heparin  lock flush 100 unit/mL  500 Units Intracatheter Once PRN Earlie Server, MD      . PACLitaxel (TAXOL) 78 mg in sodium chloride 0.9 % 250 mL chemo infusion (</= '80mg'$ /m2)  45 mg/m2 (Treatment  Plan Recorded) Intravenous Once Earlie Server, MD      . sodium chloride flush (NS) 0.9 % injection 10 mL  10 mL Intravenous PRN Earlie Server, MD   10 mL at 02/02/18 0840     PHYSICAL EXAMINATION: ECOG PERFORMANCE STATUS: 1 - Symptomatic but completely ambulatory Vitals:   02/02/18 0919 02/02/18 0925  BP: 105/69   Pulse: 98   Resp: 18   Temp: 98 F (36.7 C)   SpO2:  99%   Filed Weights   02/02/18 0919  Weight: 145 lb 14.4 oz (66.2 kg)    Physical Exam  Constitutional: She is oriented to person, place, and time. She appears well-developed and well-nourished. No distress.  HENT:  Head: Normocephalic and atraumatic.  Right Ear: External ear normal.  Left Ear: External ear normal.  Mouth/Throat: Oropharynx is clear and moist.  Eyes: Pupils are equal, round, and reactive to light. Conjunctivae and EOM are normal. No scleral icterus.  Neck: Normal range of motion. Neck supple.  Right supra clavicular lymphadenopathy,Status post excisional biopsy.  Cardiovascular: Normal rate, regular rhythm and normal heart sounds.  Pulmonary/Chest: Effort normal. No respiratory distress. She has no wheezes. She has no rales. She exhibits no tenderness.  I do not appreciate any wheezing today.  Abdominal: Soft. Bowel sounds are normal. She exhibits no distension and no mass. There is no tenderness.  Musculoskeletal: Normal range of motion. She exhibits no edema or deformity.  Lymphadenopathy:    She has no cervical adenopathy.  Neurological: She is alert and oriented to person, place, and time. No cranial nerve deficit. Coordination normal.  Skin: Skin is warm and dry. No rash noted.  Psychiatric: She has a normal mood and affect. Her behavior is normal. Thought content normal.     LABORATORY DATA:  I have reviewed the  data as listed Lab Results  Component Value Date   WBC 6.3 02/02/2018   HGB 11.6 (L) 02/02/2018   HCT 33.9 (L) 02/02/2018   MCV 92.2 02/02/2018   PLT 141 (L) 02/02/2018   Recent Labs    01/19/18 0819 01/26/18 1018 02/02/18 0839  NA 137 134* 138  K 3.7 3.9 3.5  CL 103 100 102  CO2 _0 GLUCOSE 196* 257* 175*  BUN 25* 17 23  CREATININE 0.85 0.73 0.71  CALCIUM 9.6 9.2 9.3  GFRNONAA >60 >60 >60  GFRAA >60 >60 >60  PROT 7.0 6.4* 6.6  ALBUMIN 3.3* 3.2* 3.3*  AST _1 ALT _2 ALKPHOS 84 79 83  BILITOT 0.5 0.6 0.5       ASSESSMENT & PLAN:  1. Supraclavicular lymphadenopathy   2. Malignant neoplasm of right lung, unspecified part of lung ([place])   3. Encounter for antineoplastic chemotherapy   4. Wheezing    #working diagnosis currently on concurrent chemoradiation.  Is Non-small cell lung cancer, cTx N3M0, Stage III: massive necrotizing neck/mediastinum/hilum lymphadenopathy. On concurrent chemoradiation with carbotaxol. I have discussed with patient and her family members about the cancer type ID test result and OmniSeq Advance test result. Patient has been referred to [facility] oncology Dr. Durenda Hurt for second opinion. For now we will continue treating with radiation in combination with concurrent chemo therapy Labs reviewed and discussed with patient and her daughter.  Acceptable proceeding today's [place] and Taxol.  # Wheezing: Has significantly improved.  Currently on 20 mg of prednisone.  If she continues to improve, will taper it down.  # Anxiety: Continue Ativan 0.44m  Q8h as needed for anxiety. .   # Anemia; due to chemotherapy and radiation treatment.  Stable.  Continue to monitor.   #Leukocytosis, predominantly neutrophilia, secondary to prednisone use. Grade 1 thrombocytopenia secondary to#chemotherapy and radiation.  Stable.  Continue to monitor. All questions were answered to her satisfaction.  The patient knows to call the clinic with any problems  questions or concerns.  Return of visit: 1 week  Total face to face encounter time for this patient visit was 25 min. >50% of the time was  spent in counseling and coordination of care.   Earlie Server, MD, PhD Hematology Oncology [facility] Pager- [phone] 02/02/2018

## 2018-02-03 ENCOUNTER — Ambulatory Visit
Admission: RE | Admit: 2018-02-03 | Discharge: 2018-02-03 | Disposition: A | Discharge status: Home / Self Care | Payer: Medicare Other | Source: Ambulatory Visit | Attending: Radiation Oncology | Admitting: Radiation Oncology

## 2018-02-03 DIAGNOSIS — C3491 Malignant neoplasm of unspecified part of right bronchus or lung: Secondary | ICD-10-CM | POA: Diagnosis not present

## 2018-02-04 ENCOUNTER — Ambulatory Visit
Admission: RE | Admit: 2018-02-04 | Discharge: 2018-02-04 | Disposition: A | Discharge status: Home / Self Care | Payer: Medicare Other | Source: Ambulatory Visit | Attending: Radiation Oncology | Admitting: Radiation Oncology

## 2018-02-04 ENCOUNTER — Other Ambulatory Visit: Payer: Self-pay | Admitting: Cardiovascular Disease

## 2018-02-04 DIAGNOSIS — C3491 Malignant neoplasm of unspecified part of right bronchus or lung: Secondary | ICD-10-CM | POA: Diagnosis not present

## 2018-02-04 DIAGNOSIS — Z01818 Encounter for other preprocedural examination: Secondary | ICD-10-CM

## 2018-02-04 DIAGNOSIS — I2510 Atherosclerotic heart disease of native coronary artery without angina pectoris: Secondary | ICD-10-CM

## 2018-02-07 ENCOUNTER — Ambulatory Visit
Admission: RE | Admit: 2018-02-07 | Discharge: 2018-02-07 | Disposition: A | Discharge status: Home / Self Care | Payer: Medicare Other | Source: Ambulatory Visit | Attending: Radiation Oncology | Admitting: Radiation Oncology

## 2018-02-07 DIAGNOSIS — C3491 Malignant neoplasm of unspecified part of right bronchus or lung: Secondary | ICD-10-CM | POA: Diagnosis not present

## 2018-02-08 ENCOUNTER — Ambulatory Visit: Payer: Medicare Other

## 2018-02-08 ENCOUNTER — Ambulatory Visit
Admission: RE | Admit: 2018-02-08 | Discharge: 2018-02-08 | Disposition: A | Discharge status: Home / Self Care | Payer: Medicare Other | Source: Ambulatory Visit | Attending: Radiation Oncology | Admitting: Radiation Oncology

## 2018-02-08 DIAGNOSIS — C3491 Malignant neoplasm of unspecified part of right bronchus or lung: Secondary | ICD-10-CM | POA: Diagnosis not present

## 2018-02-09 ENCOUNTER — Inpatient Hospital Stay (HOSPITAL_BASED_OUTPATIENT_CLINIC_OR_DEPARTMENT_OTHER): Payer: Medicare Other | Admitting: Oncology

## 2018-02-09 ENCOUNTER — Ambulatory Visit
Admission: RE | Admit: 2018-02-09 | Discharge: 2018-02-09 | Disposition: A | Discharge status: Home / Self Care | Payer: Medicare Other | Source: Ambulatory Visit | Attending: Radiation Oncology | Admitting: Radiation Oncology

## 2018-02-09 ENCOUNTER — Inpatient Hospital Stay: Payer: Medicare Other

## 2018-02-09 ENCOUNTER — Encounter: Payer: Self-pay | Admitting: Oncology

## 2018-02-09 VITALS — BP 125/82 | HR 100 | Temp 96.8°F | Resp 18 | Wt 147.1 lb

## 2018-02-09 DIAGNOSIS — R0602 Shortness of breath: Secondary | ICD-10-CM | POA: Diagnosis not present

## 2018-02-09 DIAGNOSIS — C3491 Malignant neoplasm of unspecified part of right bronchus or lung: Secondary | ICD-10-CM

## 2018-02-09 DIAGNOSIS — C3481 Malignant neoplasm of overlapping sites of right bronchus and lung: Secondary | ICD-10-CM | POA: Diagnosis not present

## 2018-02-09 DIAGNOSIS — D6481 Anemia due to antineoplastic chemotherapy: Secondary | ICD-10-CM | POA: Diagnosis not present

## 2018-02-09 DIAGNOSIS — F419 Anxiety disorder, unspecified: Secondary | ICD-10-CM | POA: Diagnosis not present

## 2018-02-09 DIAGNOSIS — D72829 Elevated white blood cell count, unspecified: Secondary | ICD-10-CM

## 2018-02-09 DIAGNOSIS — Z5111 Encounter for antineoplastic chemotherapy: Secondary | ICD-10-CM

## 2018-02-09 DIAGNOSIS — R221 Localized swelling, mass and lump, neck: Secondary | ICD-10-CM

## 2018-02-09 DIAGNOSIS — R062 Wheezing: Secondary | ICD-10-CM

## 2018-02-09 DIAGNOSIS — D649 Anemia, unspecified: Secondary | ICD-10-CM

## 2018-02-09 LAB — CBC WITH DIFFERENTIAL/PLATELET
BASOS ABS: 0 10*3/uL (ref 0–0.1)
BASOS PCT: 1 %
Eosinophils Absolute: 0.1 10*3/uL (ref 0–0.7)
Eosinophils Relative: 1 %
HEMATOCRIT: 32.5 % — AB (ref 35.0–47.0)
HEMOGLOBIN: 11.1 g/dL — AB (ref 12.0–16.0)
LYMPHS PCT: 10 %
Lymphs Abs: 0.6 10*3/uL — ABNORMAL LOW (ref 1.0–3.6)
MCH: 31.7 pg (ref 26.0–34.0)
MCHC: 34 g/dL (ref 32.0–36.0)
MCV: 93.3 fL (ref 80.0–100.0)
MONOS PCT: 6 %
Monocytes Absolute: 0.4 10*3/uL (ref 0.2–0.9)
NEUTROS ABS: 5.2 10*3/uL (ref 1.4–6.5)
NEUTROS PCT: 82 %
Platelets: 157 10*3/uL (ref 150–440)
RBC: 3.48 MIL/uL — ABNORMAL LOW (ref 3.80–5.20)
RDW: 15.8 % — ABNORMAL HIGH (ref 11.5–14.5)
WBC: 6.3 10*3/uL (ref 3.6–11.0)

## 2018-02-09 LAB — COMPREHENSIVE METABOLIC PANEL
ALBUMIN: 3.2 g/dL — AB (ref 3.5–5.0)
ALK PHOS: 69 U/L (ref 38–126)
ALT: 20 U/L (ref 0–44)
ANION GAP: 11 (ref 5–15)
AST: 21 U/L (ref 15–41)
BUN: 25 mg/dL — ABNORMAL HIGH (ref 8–23)
CO2: 26 mmol/L (ref 22–32)
Calcium: 9.5 mg/dL (ref 8.9–10.3)
Chloride: 101 mmol/L (ref 98–111)
Creatinine, Ser: 0.68 mg/dL (ref 0.44–1.00)
GFR calc non Af Amer: >60 mL/min (ref >60)
Glucose, Bld: 217 mg/dL — ABNORMAL HIGH (ref 70–99)
POTASSIUM: 3.6 mmol/L (ref 3.5–5.1)
SODIUM: 138 mmol/L (ref 135–145)
Total Bilirubin: 0.5 mg/dL (ref 0.3–1.2)
Total Protein: 6.5 g/dL (ref 6.5–8.1)

## 2018-02-09 MED ORDER — HEPARIN SOD (PORK) LOCK FLUSH 100 UNIT/ML IV SOLN: 500.0000 [IU] | Freq: Once | INTRAVENOUS | Status: DC | PRN

## 2018-02-09 MED ORDER — FAMOTIDINE IN NACL 20-0.9 MG/50ML-% IV SOLN
20.0000 mg | Freq: Once | INTRAVENOUS | Status: AC
  Administered 2018-02-09: 20 mg via INTRAVENOUS
  Filled 2018-02-09: qty 50

## 2018-02-09 MED ORDER — SODIUM CHLORIDE 0.9% FLUSH
10.0000 mL | Freq: Once | INTRAVENOUS | Status: AC
  Administered 2018-02-09: 10 mL via INTRAVENOUS
  Filled 2018-02-09: qty 10

## 2018-02-09 MED ORDER — SODIUM CHLORIDE 0.9 % IV SOLN
171.4000 mg | Freq: Once | INTRAVENOUS | Status: AC
  Administered 2018-02-09: 170 mg via INTRAVENOUS
  Filled 2018-02-09: qty 17

## 2018-02-09 MED ORDER — PALONOSETRON HCL INJECTION 0.25 MG/5ML
0.2500 mg | Freq: Once | INTRAVENOUS | Status: AC
  Administered 2018-02-09: 0.25 mg via INTRAVENOUS
  Filled 2018-02-09: qty 5

## 2018-02-09 MED ORDER — PREDNISONE 10 MG PO TABS: 10.0000 mg | ORAL_TABLET | Freq: Every day | ORAL | 0 refills | Status: DC

## 2018-02-09 MED ORDER — HEPARIN SOD (PORK) LOCK FLUSH 100 UNIT/ML IV SOLN
500.0000 [IU] | Freq: Once | INTRAVENOUS | Status: AC
  Administered 2018-02-09: 500 [IU] via INTRAVENOUS
  Filled 2018-02-09: qty 5

## 2018-02-09 MED ORDER — SODIUM CHLORIDE 0.9% FLUSH
10.0000 mL | INTRAVENOUS | Status: DC | PRN
  Filled 2018-02-09: qty 10

## 2018-02-09 MED ORDER — SODIUM CHLORIDE 0.9 % IV SOLN
45.0000 mg/m2 | Freq: Once | INTRAVENOUS | Status: AC
  Administered 2018-02-09: 78 mg via INTRAVENOUS
  Filled 2018-02-09: qty 13

## 2018-02-09 MED ORDER — DIPHENHYDRAMINE HCL 50 MG/ML IJ SOLN
50.0000 mg | Freq: Once | INTRAMUSCULAR | Status: AC
  Administered 2018-02-09: 50 mg via INTRAVENOUS
  Filled 2018-02-09: qty 1

## 2018-02-09 MED ORDER — SODIUM CHLORIDE 0.9 % IV SOLN
20.0000 mg | Freq: Once | INTRAVENOUS | Status: AC
  Administered 2018-02-09: 20 mg via INTRAVENOUS
  Filled 2018-02-09: qty 2

## 2018-02-09 MED ORDER — SODIUM CHLORIDE 0.9 % IV SOLN
Freq: Once | INTRAVENOUS | Status: AC
  Administered 2018-02-09: 10:00:00 via INTRAVENOUS
  Filled 2018-02-09: qty 1000

## 2018-02-09 NOTE — Progress Notes (Signed)
Pt in today with daughter and husband for follow up and chemo.  Reports BP low on Sunday and Monday, and a dry cough has returned.

## 2018-02-09 NOTE — Progress Notes (Signed)
Hematology/Oncology follow up  [facility] Telephone:([phone] Fax:([phone]   Patient Care Team: Crecencio Mc, MD as PCP - General (Internal Medicine) Rockey Situ, Kathlene November, MD (Cardiology) Telford Nab, RN as Registered Nurse Bary Castilla, Forest Gleason, MD (General Surgery)  REFERRING PROVIDER: Crecencio Mc, MD REASON FOR VISIT Follow up for treatment of Non-small cell lung cancer, cTx N3M0, Stage III:  HISTORY OF PRESENTING ILLNESS:  Dana Ramirez is a  67 y.o.  female with PMH listed below who was referred to me for evaluation of neck mass. Patient reports having cough for about 2 to 3 months.  Started after a mild Upper respiratory infection, and becomes chronic.  Associate with throat discomfort.   Denies any hemoptysis, yellowish or greenish sputum. Nothing make cough better or worse.  She also noticed right neck lump for couple of weeks, the neck lump is tender.  Associated with right side neck,  and right side headache.  Denies any vision changes.Her appetite has decreased for the past couple of months and lost  a few pounds. Denies any night sweating, fever or chills. She is a never smoker, husband  and used to smoke at home. Denies any known family history of cancer.  Data Reviewed: 12/20/2017 CT neck soft tissue with contrast showed palpable abnormality represent a necrotic right supraclavicular lymph node measuring 2.2 cm in diameter.  This is the tip of the iceberg as there is massive necrotic lymphadenopathy in the right hilum and mediastinum, starting to be malignant.  Presumed lung primary is not demonstrated in the upper half of the chest.  # Patient's case were discussed on tumor board on 01/06/2018.  Per pathology, this is most consistent with non-small cell lung cancer, poorly differentiated carcinoma..  Discussed with Dr.Onley and  she confirmed that additional IHC stains all came back negative for lymphoma.  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  No mutation identified for ALK, BRAF, EGFR, HER2, KRAS, MET, NTRK, RET fusion, ROS1.   INTERVAL HISTORY Dana Ramirez 67 y.o.  female with above history who presents for evaluation prior to Cycle 5 concurrent chemotherapywith radiation for treatment of non-small cell lung cancer. During the interval patient was seen for an second opinion by Dr. Aniceto Boss at [facility]. . Communicated with Dr. Aniceto Boss who thinks patient's presentation is most consistent with non-small cell lung cancer not  Sarcoma.  Currently awaiting pathologist at [facility] to review the slides.  Dr. Durenda Hurt recommend to continue with chemoradiation with carboplatin and Taxol with the plan to initiate immunotherapy maintenance after that. Patient was accompanied by her daughter and husband to clinic today. Problems and complaints are listed below: Chemotherapy induced nausea: Denies F denies any pain atigue: She feels worsening of fatigue on the day of chemotherapy.  Neoplasm related pain scale: Denies any pain.Marland Kitchen Opioid associated constipation: Not taking any narcotics.. Shortness of breath with wheezing, wheezing has improved.  Shortness of breath is at baseline.  Patient reports starting to have nonproductive cough for the past 2 to 3 days.  No hemoptysis. Patient takes prednisone 20 mg daily which was started to alleviate her respiratory symptoms..   Review of Systems  Constitutional: Positive for malaise/fatigue. Negative for chills, fever and weight loss.  HENT: Negative for congestion, ear discharge, ear pain, nosebleeds, sinus pain and sore throat.   Eyes: Negative for double vision, photophobia, pain, discharge and redness.  Respiratory:  Positive for cough. Negative for hemoptysis, sputum production, shortness of breath and wheezing.   Cardiovascular: Negative for chest pain, palpitations, orthopnea, claudication  and leg swelling.  Gastrointestinal: Negative for abdominal pain, blood in stool, constipation, diarrhea, heartburn, melena, nausea and vomiting.  Genitourinary: Negative for dysuria, flank pain, frequency and hematuria.  Musculoskeletal: Negative for back pain, myalgias and neck pain.  Skin: Negative for itching and rash.  Neurological: Negative for dizziness, tingling, tremors, focal weakness, weakness and headaches.  Endo/Heme/Allergies: Negative for environmental allergies. Does not bruise/bleed easily.  Psychiatric/Behavioral: Negative for depression and hallucinations. The patient is not nervous/anxious.     MEDICAL HISTORY:  Past Medical History:  Diagnosis Date  . CAD (coronary artery disease)   . Carotid arterial disease ([place])   . Diabetes mellitus without complication ([place])   . History of vertigo   . Hyperlipidemia   . Hypertension   . Kidney stone     SURGICAL HISTORY: Past Surgical History:  Procedure Laterality Date  . BREAST CYST ASPIRATION Left 1980s  . CESAREAN SECTION     x 2  . CHOLECYSTECTOMY    . CORONARY ARTERY BYPASS GRAFT  03/2005   LIMA to the LAD, vein graft to diagonal with a negative stress test in 06/2006  . PORTACATH PLACEMENT Left 12/31/2017   Procedure: INSERTION PORT-A-CATH;  Surgeon: Robert Bellow, MD;  Location: [facility] ORS;  Service: General;  Laterality: Left;  . SENTINEL NODE BIOPSY Right 12/31/2017   Procedure: SUPRACLAVICULAR NODE BIOPSY;  Surgeon: Robert Bellow, MD;  Location: [facility] ORS;  Service: General;  Laterality: Right;  . TONSILLECTOMY      SOCIAL HISTORY: Social History   Socioeconomic History  . Marital status: Married    Spouse name: Elenore Rota   . Number of children: 2  . Years of education: Not on file  . Highest education level: Not on file  Occupational History  . Occupation: Retired     Fish farm manager: OTHER    Comment: [employer]  Social Needs  . Financial resource strain: Not on file  . Food insecurity:    Worry: Not  on file    Inability: Not on file  . Transportation needs:    Medical: Not on file    Non-medical: Not on file  Tobacco Use  . Smoking status: Never Smoker  . Smokeless tobacco: Never Used  Substance and Sexual Activity  . Alcohol use: No  . Drug use: No  . Sexual activity: Not on file  Lifestyle  . Physical activity:    Days per week: Not on file    Minutes per session: Not on file  . Stress: Not on file  Relationships  . Social connections:    Talks on phone: Not on file    Gets together: Not on file    Attends religious service: Not on file    Active member of club or organization: Not on file    Attends meetings of clubs or organizations: Not on file    Relationship status: Not on file  . Intimate partner violence:    Fear of current or ex partner: Not on file    Emotionally abused: Not on file    Physically abused: Not on file    Forced sexual activity: Not on file  Other Topics Concern  . Not on file  Social History Narrative   Married   Gets regular exercise, once or twice every week    FAMILY HISTORY: Family History  Problem Relation Age of  Onset  . Heart disease Mother   . Heart disease Father 60  . Stroke Sister 83  . Heart disease Sister   . Diabetes Brother   . Coronary artery disease Other        Strong family history of CAD and CABG  . Heart disease Maternal Grandmother   . Breast cancer Maternal Grandmother   . Heart disease Maternal Grandfather   . Heart disease Paternal Grandmother   . Breast cancer Paternal Grandmother 32  . Heart disease Paternal Grandfather     ALLERGIES:  is allergic to codeine; lisinopril; and penicillins.  MEDICATIONS:  Current Outpatient Medications  Medication Sig Dispense Refill  . acetaminophen (TYLENOL) 650 MG CR tablet Take 650 mg by mouth every 8 (eight) hours as needed for pain.    Marland Kitchen albuterol (PROVENTIL HFA;VENTOLIN HFA) 108 (90 Base) MCG/ACT inhaler Inhale 2 puffs into the lungs every 6 (six) hours as  needed for wheezing or shortness of breath. 1 Inhaler 2  . aspirin (ASPIR-LOW) 81 MG EC tablet Take 81 mg by mouth 2 (two) times daily.     . Cholecalciferol (VITAMIN D3) 1000 units CAPS Take 1 capsule by mouth daily.    . cyanocobalamin 1000 MCG tablet Take 1,000 mcg by mouth daily.    Marland Kitchen glipiZIDE (GLUCOTROL XL) 2.5 MG 24 hr tablet TAKE ONE TABLET BY MOUTH EVERY MORNING WITH BREAKFAST 90 tablet 1  . JANUVIA 100 MG tablet TAKE ONE TABLET BY MOUTH DAILY 90 tablet 1  . lidocaine-prilocaine (EMLA) cream Apply to affected area once 30 g 3  . LORazepam (ATIVAN) 0.5 MG tablet Take 1 tablet (0.5 mg total) by mouth every 8 (eight) hours as needed for anxiety (nausea). 30 tablet 0  . metFORMIN (GLUCOPHAGE) 1000 MG tablet TAKE ONE TABLET BY MOUTH DAILY 90 tablet 1  . metoprolol succinate (TOPROL-XL) 25 MG 24 hr tablet TAKE 1 TABLET DAILY 90 tablet 3  . omeprazole (PRILOSEC OTC) 20 MG tablet Take 20 mg by mouth daily.    . predniSONE (DELTASONE) 20 MG tablet Take 1 tablet (20 mg total) by mouth daily with breakfast. 30 tablet 0  . cyclobenzaprine (FLEXERIL) 10 MG tablet Take 1 tablet (10 mg total) by mouth 3 (three) times daily as needed for muscle spasms. (Patient not taking: Reported on 02/09/2018) 30 tablet 0  . ondansetron (ZOFRAN) 8 MG tablet Take 1 tablet (8 mg total) by mouth 2 (two) times daily as needed for refractory nausea / vomiting. Start on day 3 after chemo. (Patient not taking: Reported on 02/09/2018) 30 tablet 1  . prochlorperazine (COMPAZINE) 10 MG tablet Take 1 tablet (10 mg total) by mouth every 6 (six) hours as needed (Nausea or vomiting). (Patient not taking: Reported on 02/09/2018) 30 tablet 1  . rosuvastatin (CRESTOR) 20 MG tablet TAKE ONE TABLET BY MOUTH DAILY 90 tablet 1  . traMADol (ULTRAM) 50 MG tablet Take 1 tablet (50 mg total) by mouth every 6 (six) hours as needed. (Patient not taking: Reported on 02/09/2018) 30 tablet 0   No current facility-administered medications for this  visit.    Facility-Administered Medications Ordered in Other Visits  Medication Dose Route Frequency Provider Last Rate Last Dose  . CARBOplatin (PARAPLATIN) 170 mg in sodium chloride 0.9 % 250 mL chemo infusion  170 mg Intravenous Once Earlie Server, MD      . heparin lock flush 100 unit/mL  500 Units Intravenous Once Earlie Server, MD      . heparin lock flush  100 unit/mL  500 Units Intracatheter Once PRN Earlie Server, MD      . PACLitaxel (TAXOL) 78 mg in sodium chloride 0.9 % 250 mL chemo infusion (</= 28m/m2)  45 mg/m2 (Treatment Plan Recorded) Intravenous Once YEarlie Server MD 263 mL/hr at 02/09/18 1016 78 mg at 02/09/18 1016  . sodium chloride flush (NS) 0.9 % injection 10 mL  10 mL Intracatheter PRN YEarlie Server MD         PHYSICAL EXAMINATION: ECOG PERFORMANCE STATUS: 1 - Symptomatic but completely ambulatory Vitals:   02/09/18 0902  BP: 125/82  Pulse: 100  Resp: 18  Temp: (!) 96.8 F (36 C)   Filed Weights   02/09/18 0902  Weight: 147 lb 2 oz (66.7 kg)    Physical Exam  Constitutional: She is oriented to person, place, and time. She appears well-developed and well-nourished. No distress.  HENT:  Head: Normocephalic and atraumatic.  Mouth/Throat: Oropharynx is clear and moist.  Eyes: Pupils are equal, round, and reactive to light. Conjunctivae and EOM are normal. No scleral icterus.  Neck: Normal range of motion. Neck supple.  Right supra clavicular lymphadenopathy,Status post excisional biopsy.  Cardiovascular: Normal rate, regular rhythm and normal heart sounds.  Pulmonary/Chest: Effort normal and breath sounds normal. No respiratory distress. She has no wheezes.  I do not appreciate any wheezing today.  Abdominal: Soft. Bowel sounds are normal. She exhibits no distension and no mass. There is no tenderness.  Musculoskeletal: Normal range of motion. She exhibits no edema or deformity.  Neurological: She is alert and oriented to person, place, and time. No cranial nerve deficit.  Coordination normal.  Skin: Skin is warm and dry. No rash noted.  Psychiatric: She has a normal mood and affect. Her behavior is normal. Thought content normal.     LABORATORY DATA:  I have reviewed the data as listed Lab Results  Component Value Date   WBC 6.3 02/09/2018   HGB 11.1 (L) 02/09/2018   HCT 32.5 (L) 02/09/2018   MCV 93.3 02/09/2018   PLT 157 02/09/2018   Recent Labs    01/26/18 1018 02/02/18 0839 02/09/18 0804  NA 134* 138 138  K 3.9 3.5 3.6  CL 100 102 101  CO2 25 23 26   GLUCOSE 257* 175* 217*  BUN 17 23 25*  CREATININE 0.73 0.71 0.68  CALCIUM 9.2 9.3 9.5  GFRNONAA >60 >60 >60  GFRAA >60 >60 >60  PROT 6.4* 6.6 6.5  ALBUMIN 3.2* 3.3* 3.2*  AST 19 21 21   ALT 16 19 20   ALKPHOS 79 83 69  BILITOT 0.6 0.5 0.5       ASSESSMENT & PLAN:  1. Malignant neoplasm of right lung, unspecified part of lung ([place]   2. Encounter for antineoplastic chemotherapy   3. SOB (shortness of breath)   4. Anemia, unspecified type   # Lung neoplasm, clinically consistent with NSCLC,  cTx N3M0, Stage III: massive necrotizing neck/mediastinum/hilum lymphadenopathy.  Continue concurrent chemoradiation with carboplatin and Taxol. Labs are reviewed and discussed with patient and her family members. Counts are acceptable to proceed with today's carboplatin and Taxol. appreciate Dr. SVertell Limbersecond opinion.  [facility] patient does not have radiation next week.  He plans to repeat a CT scan and restart booster radiation during the following week. We will hold chemotherapy next week and resume when radiation resumes.  .Marland Kitchen #Shortness of breath, new onset of cough:  Overall her respiratory symptom has  significantly improved.  Wheezing completely resolved.  She has mentioned new onset of cough for the past few days.  Possibly due to side effects of radiation.  Continue to monitor. She has been on prednisone 20 mg to  alleviate her respiratory symptoms. Consider tapering down to 30m daily as she has DM, blood glucose is not well controlled. New Rx sent to her pharmacy.  I attempted to call her but she did not answer. I have asked RN navigator to call her and update her about the plan.   # Anxiety: Continue Ativan 0.521mQ8h as needed for anxiety. .   # Anemia; due to chemotherapy and radiation.  Counts stable.  Discussed with patient and her family members. # Grade 1 thrombocytopenia resolved. 2 weeks All questions were answered to her satisfaction.  The patient knows to call the clinic with any problems questions or concerns.  Return of visit: 2 weeks Total face to face encounter time for this patient visit was 25 min. >50% of the time was  spent in counseling and coordination of care.   ZhEarlie ServerMD, PhD Hematology Oncology [facility]- [phone]/24/2019

## 2018-02-10 ENCOUNTER — Ambulatory Visit
Admission: RE | Admit: 2018-02-10 | Discharge: 2018-02-10 | Disposition: A | Discharge status: Home / Self Care | Payer: Medicare Other | Source: Ambulatory Visit | Attending: Radiation Oncology | Admitting: Radiation Oncology

## 2018-02-10 DIAGNOSIS — C3491 Malignant neoplasm of unspecified part of right bronchus or lung: Secondary | ICD-10-CM | POA: Diagnosis not present

## 2018-02-11 ENCOUNTER — Telehealth: Payer: Self-pay | Admitting: Oncology

## 2018-02-11 ENCOUNTER — Other Ambulatory Visit: Payer: Self-pay | Admitting: Oncology

## 2018-02-11 ENCOUNTER — Ambulatory Visit
Admission: RE | Admit: 2018-02-11 | Discharge: 2018-02-11 | Disposition: A | Discharge status: Home / Self Care | Payer: Medicare Other | Source: Ambulatory Visit | Attending: Radiation Oncology | Admitting: Radiation Oncology

## 2018-02-11 DIAGNOSIS — C3491 Malignant neoplasm of unspecified part of right bronchus or lung: Secondary | ICD-10-CM | POA: Diagnosis not present

## 2018-02-11 MED ORDER — LORAZEPAM 0.5 MG PO TABS: 0.5000 mg | ORAL_TABLET | Freq: Three times a day (TID) | ORAL | 0 refills | Status: AC | PRN

## 2018-02-11 NOTE — Telephone Encounter (Signed)
Patient showed up at infusion center and requests to talk.  She is tearful, very anxious. Appears to be in panic attack.  Reports blood glucose low this morning and her hand was shaking. She held her glipizide and drank juice and got better.   We do not have glucose monitor here. Patient advised to monitor her blood sugar at home.  Advise patient to use Ativan 0.5mg  Q8h as needed for anxiety or nausea.  Avoid driving after taking Ativan. She voices understanding.  After I talked to her for 20 minutes, she appears less anxious, feeling better. Patient sent home.

## 2018-02-14 ENCOUNTER — Ambulatory Visit: Payer: Medicare Other

## 2018-02-15 ENCOUNTER — Other Ambulatory Visit: Payer: Medicare Other

## 2018-02-18 ENCOUNTER — Other Ambulatory Visit: Payer: Self-pay

## 2018-02-18 ENCOUNTER — Other Ambulatory Visit: Payer: Self-pay | Admitting: *Deleted

## 2018-02-18 ENCOUNTER — Encounter: Payer: Self-pay | Admitting: Radiation Oncology

## 2018-02-18 ENCOUNTER — Ambulatory Visit
Admission: RE | Admit: 2018-02-18 | Discharge: 2018-02-18 | Disposition: A | Discharge status: Home / Self Care | Payer: Medicare Other | Source: Ambulatory Visit | Attending: Radiation Oncology | Admitting: Radiation Oncology

## 2018-02-18 VITALS — BP 134/74 | HR 94 | Temp 96.4°F | Resp 18 | Ht 63.5 in | Wt 145.8 lb

## 2018-02-18 DIAGNOSIS — C3481 Malignant neoplasm of overlapping sites of right bronchus and lung: Secondary | ICD-10-CM | POA: Insufficient documentation

## 2018-02-18 DIAGNOSIS — Z923 Personal history of irradiation: Secondary | ICD-10-CM | POA: Diagnosis not present

## 2018-02-18 DIAGNOSIS — R21 Rash and other nonspecific skin eruption: Secondary | ICD-10-CM | POA: Diagnosis not present

## 2018-02-18 MED ORDER — SILVER SULFADIAZINE 1 % EX CREA: 1.0000 "application " | TOPICAL_CREAM | Freq: Two times a day (BID) | CUTANEOUS | 2 refills | Status: AC

## 2018-02-18 NOTE — Progress Notes (Signed)
Radiation Oncology Follow up Note  Name: Dana Ramirez   Date:   02/18/2018 MRN:  [id number] DOB: 1951/01/16    This 67 y.o. female presents to the clinic today for follow-up one week after completion of large field chest radiation for stage IIIB non-small cell lung cancer.  REFERRING PROVIDER: Crecencio Mc, MD  HPI: patient is a 67 year old female just completed four-week course of radiation therapy with concurrent chemotherapy for stage IIIB non-small cell lung cancer of the right lung..she had a large tumor with supraclavicular involvement and I plan to split course of radiation. She is seen today and is doing well she has developed skin reaction in the region of the right supraclavicular fossa for which I've prescribed Silvadine cream. She specifically denies dysphagia cough hemoptysis. She is having some slight specks of blood when she blows her nose. This may be more related to her platelet count.platelet counts in the 150 range.  COMPLICATIONS OF TREATMENT: none  FOLLOW UP COMPLIANCE: keeps appointments   PHYSICAL EXAM:  BP 134/74   Pulse 94   Temp (!) 96.4 F (35.8 C)   Resp 18   Ht 5' 3.5" (1.613 m)   Wt 145 lb 13.4 oz (66.2 kg)   BMI 25.43 kg/m  Well-developed well-nourished patient in NAD. HEENT reveals PERLA, EOMI, discs not visualized.  Oral cavity is clear. No oral mucosal lesions are identified. Neck is clear without evidence of cervical or supraclavicular adenopathy. Lungs are clear to A&P. Cardiac examination is essentially unremarkable with regular rate and rhythm without murmur rub or thrill. Abdomen is benign with no organomegaly or masses noted. Motor sensory and DTR levels are equal and symmetric in the upper and lower extremities. Cranial nerves II through XII are grossly intact. Proprioception is intact. No peripheral adenopathy or edema is identified. No motor or sensory levels are noted. Crude visual fields are within normal range.  RADIOLOGY RESULTS: no  current films for review  PLAN: present time I'll run another CT scan for treatment planning purposes to evaluate response. Would plan on delivering another 2600 cGy to a small field boost. I will review the films at the time of simulation with the patient and her daughter. I'm starting him on some Silvadene cream for her skin reaction. Patient also will receive chemotherapy next week under medical oncology's direction. Patient and daughter both seem to comprehend my treatment plan well.  I would like to take this opportunity to thank you for allowing me to participate in the care of your patient.Noreene Filbert, MD

## 2018-02-22 ENCOUNTER — Ambulatory Visit
Admission: RE | Admit: 2018-02-22 | Discharge: 2018-02-22 | Disposition: A | Discharge status: Home / Self Care | Payer: Medicare Other | Source: Ambulatory Visit | Attending: Radiation Oncology | Admitting: Radiation Oncology

## 2018-02-22 DIAGNOSIS — C3491 Malignant neoplasm of unspecified part of right bronchus or lung: Secondary | ICD-10-CM | POA: Diagnosis present

## 2018-02-23 ENCOUNTER — Encounter: Payer: Self-pay | Admitting: Oncology

## 2018-02-23 ENCOUNTER — Inpatient Hospital Stay: Payer: Medicare Other

## 2018-02-23 ENCOUNTER — Other Ambulatory Visit: Payer: Self-pay | Admitting: Cardiovascular Disease

## 2018-02-23 ENCOUNTER — Inpatient Hospital Stay: Payer: Medicare Other | Admitting: Oncology

## 2018-02-23 ENCOUNTER — Telehealth: Payer: Self-pay | Admitting: *Deleted

## 2018-02-23 DIAGNOSIS — Z8249 Family history of ischemic heart disease and other diseases of the circulatory system: Secondary | ICD-10-CM

## 2018-02-23 DIAGNOSIS — Z01818 Encounter for other preprocedural examination: Secondary | ICD-10-CM

## 2018-02-23 DIAGNOSIS — I2510 Atherosclerotic heart disease of native coronary artery without angina pectoris: Secondary | ICD-10-CM

## 2018-02-23 NOTE — Telephone Encounter (Signed)
Daughter called reporting that patient started vomiting plast night and thinks she may now be dehydrated, she is staying nauseated. Please advise

## 2018-02-23 NOTE — Progress Notes (Deleted)
Hematology/Oncology follow up  [facility] [phone] [phone]   Patient Care Team: Crecencio Mc, MD as PCP - General (Internal Medicine) Rockey Situ, Kathlene November, MD (Cardiology) Telford Nab, RN as Registered Nurse Bary Castilla, Forest Gleason, MD (General Surgery)  REFERRING PROVIDER: Crecencio Mc, MD REASON FOR VISIT Follow up for treatment of Non-small cell lung cancer, cTx N3M0, Stage III:  HISTORY OF PRESENTING ILLNESS:  Dana Ramirez is a  67 y.o.  female with PMH listed below who was referred to me for evaluation of neck mass. Patient reports having cough for about 2 to 3 months.  Started after a mild Upper respiratory infection, and becomes chronic.  Associate with throat discomfort.   Denies any hemoptysis, yellowish or greenish sputum. Nothing make cough better or worse.  She also noticed right neck lump for couple of weeks, the neck lump is tender.  Associated with right side neck,  and right side headache.  Denies any vision changes.Her appetite has decreased for the past couple of months and lost  a few pounds. Denies any night sweating, fever or chills. She is a never smoker, husband  and used to smoke at home. Denies any known family history of cancer.  Data Reviewed: 12/20/2017 CT neck soft tissue with contrast showed palpable abnormality represent a necrotic right supraclavicular lymph node measuring 2.2 cm in diameter.  This is the tip of the iceberg as there is massive necrotic lymphadenopathy in the right hilum and mediastinum, starting to be malignant.  Presumed lung primary is not demonstrated in the upper half of the chest.  # Patient's case were discussed on tumor board on 01/06/2018.  Per pathology, this is most consistent with non-small cell lung cancer, poorly differentiated carcinoma..  Discussed with Dr.Onley and  she confirmed that additional IHC stains all came back negative for lymphoma.  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  No mutation identified for ALK, BRAF, EGFR, HER2, KRAS, MET, NTRK, RET fusion, ROS1.   # patient was seen for an second opinion by Dr. Aniceto Boss at [facility]. . Communicated with Dr. Aniceto Boss who thinks patient's presentation is most consistent with non-small cell lung cancer not  Sarcoma.  Currently awaiting pathologist at [facility] to review the slides.  Dr. Durenda Hurt recommend to continue with chemoradiation with carboplatin and Taxol with the plan to initiate immunotherapy maintenance after that.   INTERVAL HISTORY [name] 67 y.o.  female with above history who presents for evaluation prior to cycle 6 Carbo and Taxol with concurrent RT.   [facility] second opinion A. "Lymph node, right supraclavicular; excisional biopsy" (Outside case, [phone], [facility], [place], [place]. Date of procedure 12/31/17): Metastatic non-small cell carcinoma. See comment. Comment: The tumor cells are epithelioid, of high cytologic grade and poorly differentiated. Diffusely positive anti-keratin immunoreactivity is observed, supporting the diagnosis above. Accompanying reports describe negative reactivity for immunohistochemical stains to indicate squamous or adenocarcinomatous differentiation (TTF-1, Napsin A, p40)   Problems and complaints are listed below: Chemotherapy induced nausea: Denies F denies any pain atigue: She feels worsening of fatigue on the day of chemotherapy.  Neoplasm related pain scale: Denies any pain.Marland Kitchen Opioid associated constipation: Not taking any narcotics.. Shortness of breath with wheezing, wheezing has improved.  Shortness of breath is at baseline.  Patient reports starting to have nonproductive cough for the past 2 to 3 days.  No  hemoptysis. Patient takes prednisone 20 mg daily which was started to alleviate her respiratory  symptoms..   Review of Systems  Constitutional: Positive for malaise/fatigue. Negative for chills, fever and weight loss.  HENT: Negative for congestion, ear discharge, ear pain, nosebleeds, sinus pain and sore throat.   Eyes: Negative for double vision, photophobia, pain, discharge and redness.  Respiratory: Positive for cough. Negative for hemoptysis, sputum production, shortness of breath and wheezing.   Cardiovascular: Negative for chest pain, palpitations, orthopnea, claudication and leg swelling.  Gastrointestinal: Negative for abdominal pain, blood in stool, constipation, diarrhea, heartburn, melena, nausea and vomiting.  Genitourinary: Negative for dysuria, flank pain, frequency and hematuria.  Musculoskeletal: Negative for back pain, myalgias and neck pain.  Skin: Negative for itching and rash.  Neurological: Negative for dizziness, tingling, tremors, focal weakness, weakness and headaches.  Endo/Heme/Allergies: Negative for environmental allergies. Does not bruise/bleed easily.  Psychiatric/Behavioral: Negative for depression and hallucinations. The patient is not nervous/anxious.     MEDICAL HISTORY:  Past Medical History:  Diagnosis Date  . CAD (coronary artery disease)   . Carotid arterial disease ([place])   . Diabetes mellitus without complication ([place])   . History of vertigo   . Hyperlipidemia   . Hypertension   . Kidney stone     SURGICAL HISTORY: Past Surgical History:  Procedure Laterality Date  . BREAST CYST ASPIRATION Left 1980s  . CESAREAN SECTION     x 2  . CHOLECYSTECTOMY    . CORONARY ARTERY BYPASS GRAFT  03/2005   LIMA to the LAD, vein graft to diagonal with a negative stress test in 06/2006  . PORTACATH PLACEMENT Left 12/31/2017   Procedure: INSERTION PORT-A-CATH;  Surgeon: Robert Bellow, MD;  Location: [facility] ORS;  Service: General;  Laterality: Left;  . SENTINEL NODE BIOPSY Right 12/31/2017   Procedure: SUPRACLAVICULAR NODE BIOPSY;  Surgeon:  Robert Bellow, MD;  Location: [facility] ORS;  Service: General;  Laterality: Right;  . TONSILLECTOMY      SOCIAL HISTORY: Social History   Socioeconomic History  . Marital status: Married    Spouse name: Elenore Rota   . Number of children: 2  . Years of education: Not on file  . Highest education level: Not on file  Occupational History  . Occupation: Retired     Fish farm manager: OTHER    Comment: [employer]  Social Needs  . Financial resource strain: Not on file  . Food insecurity:    Worry: Not on file    Inability: Not on file  . Transportation needs:    Medical: Not on file    Non-medical: Not on file  Tobacco Use  . Smoking status: Never Smoker  . Smokeless tobacco: Never Used  Substance and Sexual Activity  . Alcohol use: No  . Drug use: No  . Sexual activity: Not on file  Lifestyle  . Physical activity:    Days per week: Not on file    Minutes per session: Not on file  . Stress: Not on file  Relationships  . Social connections:    Talks on phone: Not on file    Gets together: Not on file    Attends religious service: Not on file    Active member of club or organization: Not on file    Attends meetings of clubs or organizations: Not on file    Relationship status: Not on file  . Intimate partner violence:    Fear of current or ex partner: Not on file  Emotionally abused: Not on file    Physically abused: Not on file    Forced sexual activity: Not on file  Other Topics Concern  . Not on file  Social History Narrative   Married   Gets regular exercise, once or twice every week    FAMILY HISTORY: Family History  Problem Relation Age of Onset  . Heart disease Mother   . Heart disease Father 58  . Stroke Sister 39  . Heart disease Sister   . Diabetes Brother   . Coronary artery disease Other        Strong family history of CAD and CABG  . Heart disease Maternal Grandmother   . Breast cancer Maternal Grandmother   . Heart disease Maternal Grandfather   . Heart  disease Paternal Grandmother   . Breast cancer Paternal Grandmother 75  . Heart disease Paternal Grandfather     ALLERGIES:  is allergic to codeine; lisinopril; and penicillins.  MEDICATIONS:  Current Outpatient Medications  Medication Sig Dispense Refill  . acetaminophen (TYLENOL) 650 MG CR tablet Take 650 mg by mouth every 8 (eight) hours as needed for pain.    Marland Kitchen albuterol (PROVENTIL HFA;VENTOLIN HFA) 108 (90 Base) MCG/ACT inhaler Inhale 2 puffs into the lungs every 6 (six) hours as needed for wheezing or shortness of breath. 1 Inhaler 2  . aspirin (ASPIR-LOW) 81 MG EC tablet Take 81 mg by mouth 2 (two) times daily.     . Cholecalciferol (VITAMIN D3) 1000 units CAPS Take 1 capsule by mouth daily.    . cyanocobalamin 1000 MCG tablet Take 1,000 mcg by mouth daily.    . cyclobenzaprine (FLEXERIL) 10 MG tablet Take 1 tablet (10 mg total) by mouth 3 (three) times daily as needed for muscle spasms. (Patient not taking: Reported on 02/09/2018) 30 tablet 0  . glipiZIDE (GLUCOTROL XL) 2.5 MG 24 hr tablet TAKE ONE TABLET BY MOUTH EVERY MORNING WITH BREAKFAST 90 tablet 1  . JANUVIA 100 MG tablet TAKE ONE TABLET BY MOUTH DAILY 90 tablet 1  . lidocaine-prilocaine (EMLA) cream Apply to affected area once 30 g 3  . LORazepam (ATIVAN) 0.5 MG tablet Take 1 tablet (0.5 mg total) by mouth every 8 (eight) hours as needed for anxiety (nausea). 30 tablet 0  . metFORMIN (GLUCOPHAGE) 1000 MG tablet TAKE ONE TABLET BY MOUTH DAILY 90 tablet 1  . metoprolol succinate (TOPROL-XL) 25 MG 24 hr tablet TAKE 1 TABLET DAILY 90 tablet 3  . omeprazole (PRILOSEC OTC) 20 MG tablet Take 20 mg by mouth daily.    . ondansetron (ZOFRAN) 8 MG tablet Take 1 tablet (8 mg total) by mouth 2 (two) times daily as needed for refractory nausea / vomiting. Start on day 3 after chemo. (Patient not taking: Reported on 02/09/2018) 30 tablet 1  . predniSONE (DELTASONE) 10 MG tablet Take 1 tablet (10 mg total) by mouth daily with breakfast. 14  tablet 0  . prochlorperazine (COMPAZINE) 10 MG tablet Take 1 tablet (10 mg total) by mouth every 6 (six) hours as needed (Nausea or vomiting). (Patient not taking: Reported on 02/09/2018) 30 tablet 1  . rosuvastatin (CRESTOR) 20 MG tablet TAKE ONE TABLET BY MOUTH DAILY 90 tablet 1  . silver sulfADIAZINE (SILVADENE) 1 % cream Apply 1 application topically 2 (two) times daily. 50 g 2  . traMADol (ULTRAM) 50 MG tablet Take 1 tablet (50 mg total) by mouth every 6 (six) hours as needed. (Patient not taking: Reported on 02/09/2018) 30 tablet 0  No current facility-administered medications for this visit.      PHYSICAL EXAMINATION: ECOG PERFORMANCE STATUS: 1 - Symptomatic but completely ambulatory There were no vitals filed for this visit. There were no vitals filed for this visit.  Physical Exam  Constitutional: She is oriented to person, place, and time. She appears well-developed and well-nourished. No distress.  HENT:  Head: Normocephalic and atraumatic.  Mouth/Throat: Oropharynx is clear and moist.  Eyes: Pupils are equal, round, and reactive to light. Conjunctivae and EOM are normal. No scleral icterus.  Neck: Normal range of motion. Neck supple.  Right supra clavicular lymphadenopathy,Status post excisional biopsy.  Cardiovascular: Normal rate, regular rhythm and normal heart sounds.  Pulmonary/Chest: Effort normal and breath sounds normal. No respiratory distress. She has no wheezes.  I do not appreciate any wheezing today.  Abdominal: Soft. Bowel sounds are normal. She exhibits no distension and no mass. There is no tenderness.  Musculoskeletal: Normal range of motion. She exhibits no edema or deformity.  Neurological: She is alert and oriented to person, place, and time. No cranial nerve deficit. Coordination normal.  Skin: Skin is warm and dry. No rash noted.  Psychiatric: She has a normal mood and affect. Her behavior is normal. Thought content normal.     LABORATORY DATA:  I  have reviewed the data as listed Lab Results  Component Value Date   WBC 6.3 02/09/2018   HGB 11.1 (L) 02/09/2018   HCT 32.5 (L) 02/09/2018   MCV 93.3 02/09/2018   PLT 157 02/09/2018   Recent Labs    01/26/18 1018 02/02/18 0839 02/09/18 0804  NA 134* 138 138  K 3.9 3.5 3.6  CL 100 102 101  CO2 25 23 26   GLUCOSE 257* 175* 217*  BUN 17 23 25*  CREATININE 0.73 0.71 0.68  CALCIUM 9.2 9.3 9.5  GFRNONAA >60 >60 >60  GFRAA >60 >60 >60  PROT 6.4* 6.6 6.5  ALBUMIN 3.2* 3.3* 3.2*  AST 19 21 21   ALT 16 19 20   ALKPHOS 79 83 69  BILITOT 0.6 0.5 0.5       ASSESSMENT & PLAN:  No diagnosis found.# Lung neoplasm, clinically consistent with NSCLC,  cTx N3M0, Stage III: massive necrotizing neck/mediastinum/hilum lymphadenopathy.  Continue concurrent chemoradiation with carboplatin and Taxol. Labs are reviewed and discussed with patient and her family members. Counts are acceptable to proceed with today's carboplatin and Taxol. appreciate Dr. Vertell Limber second opinion.  Currently awaiting pathology review at [facility] Discussed with Dr. Baruch Gouty, patient does not have radiation next week.  He plans to repeat a CT scan and restart booster radiation during the following week. We will hold chemotherapy next week and resume when radiation resumes.  Marland Kitchen  #Shortness of breath, new onset of cough:  Overall her respiratory symptom has significantly improved.  Wheezing completely resolved.  She has mentioned new onset of cough for the past few days.  Possibly due to side effects of radiation.  Continue to monitor. She has been on prednisone 20 mg to alleviate her respiratory symptoms. Consider tapering down to 43m daily as she has DM, blood glucose is not well controlled. New Rx sent to her pharmacy.  I attempted to call her but she did not answer. I have asked RN navigator to call her and update her about the plan.   # Anxiety: Continue Ativan 0.572mQ8h as needed for anxiety. .   # Anemia; due  to chemotherapy and radiation.  Counts stable.  Discussed with patient and her  family members. # Grade 1 thrombocytopenia resolved. 2 weeks All questions were answered to her satisfaction.  The patient knows to call the clinic with any problems questions or concerns.  Return of visit: 2 weeks Total face to face encounter time for this patient visit was 25 min. >50% of the time was  spent in counseling and coordination of care.   Earlie Server, MD, PhD Hematology Oncology [facility] at [facility] Pager- [phone] 02/23/2018

## 2018-02-23 NOTE — Telephone Encounter (Signed)
She missed her appt this morning. Can she be added to my schedule. Hold chemo. Lab md IVF

## 2018-02-23 NOTE — Telephone Encounter (Signed)
Spoke with patients daughter, Margreta Journey.  Patinet is doing much better now, she took nausea medication and is now keeping down fluids and eating without vomiting.  Patient and daughter feels no need for visit at this time, they will call me later this afternoon to give an update.

## 2018-02-24 ENCOUNTER — Ambulatory Visit (INDEPENDENT_AMBULATORY_CARE_PROVIDER_SITE_OTHER): Payer: Medicare Other

## 2018-02-24 ENCOUNTER — Other Ambulatory Visit: Payer: Self-pay

## 2018-02-24 DIAGNOSIS — I2510 Atherosclerotic heart disease of native coronary artery without angina pectoris: Secondary | ICD-10-CM | POA: Diagnosis not present

## 2018-02-24 DIAGNOSIS — Z01818 Encounter for other preprocedural examination: Secondary | ICD-10-CM | POA: Diagnosis not present

## 2018-02-24 DIAGNOSIS — Z8249 Family history of ischemic heart disease and other diseases of the circulatory system: Secondary | ICD-10-CM | POA: Diagnosis not present

## 2018-02-25 ENCOUNTER — Other Ambulatory Visit: Payer: Self-pay | Admitting: *Deleted

## 2018-02-25 MED ORDER — PREDNISONE 5 MG PO TABS: 5.0000 mg | ORAL_TABLET | Freq: Every day | ORAL | 0 refills | Status: DC

## 2018-02-28 ENCOUNTER — Encounter: Payer: Self-pay | Admitting: Oncology

## 2018-03-01 ENCOUNTER — Inpatient Hospital Stay: Payer: Medicare Other

## 2018-03-01 ENCOUNTER — Other Ambulatory Visit: Payer: Self-pay

## 2018-03-01 ENCOUNTER — Telehealth: Payer: Self-pay | Admitting: *Deleted

## 2018-03-01 ENCOUNTER — Other Ambulatory Visit: Payer: Self-pay | Admitting: Oncology

## 2018-03-01 ENCOUNTER — Inpatient Hospital Stay
Admission: AD | Admit: 2018-03-01 | Discharge: 2018-03-10 | DRG: 871 | Disposition: A | Discharge status: Home Health | Payer: Medicare Other | Source: Ambulatory Visit | Attending: Internal Medicine | Admitting: Internal Medicine

## 2018-03-01 ENCOUNTER — Inpatient Hospital Stay (HOSPITAL_BASED_OUTPATIENT_CLINIC_OR_DEPARTMENT_OTHER): Payer: Medicare Other | Admitting: Nurse Practitioner

## 2018-03-01 ENCOUNTER — Encounter: Payer: Self-pay | Admitting: Student

## 2018-03-01 ENCOUNTER — Inpatient Hospital Stay: Payer: Medicare Other | Attending: Nurse Practitioner

## 2018-03-01 ENCOUNTER — Encounter: Payer: Self-pay | Admitting: Nurse Practitioner

## 2018-03-01 ENCOUNTER — Ambulatory Visit
Admission: RE | Admit: 2018-03-01 | Discharge: 2018-03-01 | Disposition: A | Discharge status: Home / Self Care | Payer: Medicare Other | Source: Ambulatory Visit | Attending: Nurse Practitioner | Admitting: Nurse Practitioner

## 2018-03-01 VITALS — BP 107/70 | HR 109 | Temp 100.6°F | Resp 18

## 2018-03-01 VITALS — Temp 98.9°F

## 2018-03-01 DIAGNOSIS — N1330 Unspecified hydronephrosis: Secondary | ICD-10-CM

## 2018-03-01 DIAGNOSIS — I700 Atherosclerosis of aorta: Secondary | ICD-10-CM

## 2018-03-01 DIAGNOSIS — Z515 Encounter for palliative care: Secondary | ICD-10-CM | POA: Diagnosis not present

## 2018-03-01 DIAGNOSIS — N179 Acute kidney failure, unspecified: Secondary | ICD-10-CM | POA: Insufficient documentation

## 2018-03-01 DIAGNOSIS — E119 Type 2 diabetes mellitus without complications: Secondary | ICD-10-CM

## 2018-03-01 DIAGNOSIS — Z923 Personal history of irradiation: Secondary | ICD-10-CM

## 2018-03-01 DIAGNOSIS — C771 Secondary and unspecified malignant neoplasm of intrathoracic lymph nodes: Secondary | ICD-10-CM | POA: Diagnosis present

## 2018-03-01 DIAGNOSIS — R509 Fever, unspecified: Secondary | ICD-10-CM | POA: Diagnosis not present

## 2018-03-01 DIAGNOSIS — I10 Essential (primary) hypertension: Secondary | ICD-10-CM | POA: Diagnosis present

## 2018-03-01 DIAGNOSIS — J189 Pneumonia, unspecified organism: Secondary | ICD-10-CM

## 2018-03-01 DIAGNOSIS — M5412 Radiculopathy, cervical region: Secondary | ICD-10-CM | POA: Diagnosis present

## 2018-03-01 DIAGNOSIS — E785 Hyperlipidemia, unspecified: Secondary | ICD-10-CM | POA: Diagnosis present

## 2018-03-01 DIAGNOSIS — Z7189 Other specified counseling: Secondary | ICD-10-CM | POA: Diagnosis not present

## 2018-03-01 DIAGNOSIS — R221 Localized swelling, mass and lump, neck: Secondary | ICD-10-CM | POA: Insufficient documentation

## 2018-03-01 DIAGNOSIS — Z951 Presence of aortocoronary bypass graft: Secondary | ICD-10-CM

## 2018-03-01 DIAGNOSIS — C3481 Malignant neoplasm of overlapping sites of right bronchus and lung: Secondary | ICD-10-CM | POA: Diagnosis present

## 2018-03-01 DIAGNOSIS — Z885 Allergy status to narcotic agent status: Secondary | ICD-10-CM | POA: Diagnosis not present

## 2018-03-01 DIAGNOSIS — J9601 Acute respiratory failure with hypoxia: Secondary | ICD-10-CM | POA: Diagnosis present

## 2018-03-01 DIAGNOSIS — K219 Gastro-esophageal reflux disease without esophagitis: Secondary | ICD-10-CM | POA: Diagnosis present

## 2018-03-01 DIAGNOSIS — Z823 Family history of stroke: Secondary | ICD-10-CM

## 2018-03-01 DIAGNOSIS — R51 Headache: Secondary | ICD-10-CM | POA: Insufficient documentation

## 2018-03-01 DIAGNOSIS — R05 Cough: Secondary | ICD-10-CM | POA: Diagnosis not present

## 2018-03-01 DIAGNOSIS — M549 Dorsalgia, unspecified: Secondary | ICD-10-CM

## 2018-03-01 DIAGNOSIS — I2699 Other pulmonary embolism without acute cor pulmonale: Secondary | ICD-10-CM

## 2018-03-01 DIAGNOSIS — J9819 Other pulmonary collapse: Secondary | ICD-10-CM | POA: Diagnosis not present

## 2018-03-01 DIAGNOSIS — E860 Dehydration: Secondary | ICD-10-CM

## 2018-03-01 DIAGNOSIS — E1165 Type 2 diabetes mellitus with hyperglycemia: Secondary | ICD-10-CM | POA: Diagnosis not present

## 2018-03-01 DIAGNOSIS — R110 Nausea: Secondary | ICD-10-CM

## 2018-03-01 DIAGNOSIS — Z79899 Other long term (current) drug therapy: Secondary | ICD-10-CM

## 2018-03-01 DIAGNOSIS — D62 Acute posthemorrhagic anemia: Secondary | ICD-10-CM | POA: Diagnosis not present

## 2018-03-01 DIAGNOSIS — F419 Anxiety disorder, unspecified: Secondary | ICD-10-CM | POA: Diagnosis present

## 2018-03-01 DIAGNOSIS — R531 Weakness: Secondary | ICD-10-CM | POA: Diagnosis not present

## 2018-03-01 DIAGNOSIS — I2510 Atherosclerotic heart disease of native coronary artery without angina pectoris: Secondary | ICD-10-CM | POA: Diagnosis present

## 2018-03-01 DIAGNOSIS — Z8249 Family history of ischemic heart disease and other diseases of the circulatory system: Secondary | ICD-10-CM

## 2018-03-01 DIAGNOSIS — Z5189 Encounter for other specified aftercare: Secondary | ICD-10-CM

## 2018-03-01 DIAGNOSIS — R059 Cough, unspecified: Secondary | ICD-10-CM

## 2018-03-01 DIAGNOSIS — J439 Emphysema, unspecified: Secondary | ICD-10-CM

## 2018-03-01 DIAGNOSIS — Z880 Allergy status to penicillin: Secondary | ICD-10-CM

## 2018-03-01 DIAGNOSIS — Z7982 Long term (current) use of aspirin: Secondary | ICD-10-CM

## 2018-03-01 DIAGNOSIS — Z833 Family history of diabetes mellitus: Secondary | ICD-10-CM

## 2018-03-01 DIAGNOSIS — E871 Hypo-osmolality and hyponatremia: Secondary | ICD-10-CM | POA: Diagnosis present

## 2018-03-01 DIAGNOSIS — K921 Melena: Secondary | ICD-10-CM | POA: Diagnosis not present

## 2018-03-01 DIAGNOSIS — Z803 Family history of malignant neoplasm of breast: Secondary | ICD-10-CM

## 2018-03-01 DIAGNOSIS — C3491 Malignant neoplasm of unspecified part of right bronchus or lung: Secondary | ICD-10-CM | POA: Insufficient documentation

## 2018-03-01 DIAGNOSIS — E538 Deficiency of other specified B group vitamins: Secondary | ICD-10-CM | POA: Diagnosis present

## 2018-03-01 DIAGNOSIS — R0602 Shortness of breath: Secondary | ICD-10-CM

## 2018-03-01 DIAGNOSIS — Z66 Do not resuscitate: Secondary | ICD-10-CM | POA: Diagnosis present

## 2018-03-01 DIAGNOSIS — N200 Calculus of kidney: Secondary | ICD-10-CM

## 2018-03-01 DIAGNOSIS — C7931 Secondary malignant neoplasm of brain: Secondary | ICD-10-CM | POA: Insufficient documentation

## 2018-03-01 DIAGNOSIS — Z9089 Acquired absence of other organs: Secondary | ICD-10-CM

## 2018-03-01 DIAGNOSIS — J181 Lobar pneumonia, unspecified organism: Secondary | ICD-10-CM | POA: Diagnosis not present

## 2018-03-01 DIAGNOSIS — R5081 Fever presenting with conditions classified elsewhere: Secondary | ICD-10-CM | POA: Diagnosis not present

## 2018-03-01 DIAGNOSIS — M7989 Other specified soft tissue disorders: Secondary | ICD-10-CM | POA: Insufficient documentation

## 2018-03-01 DIAGNOSIS — R5383 Other fatigue: Secondary | ICD-10-CM

## 2018-03-01 DIAGNOSIS — Z95828 Presence of other vascular implants and grafts: Secondary | ICD-10-CM

## 2018-03-01 DIAGNOSIS — Z888 Allergy status to other drugs, medicaments and biological substances status: Secondary | ICD-10-CM | POA: Diagnosis not present

## 2018-03-01 DIAGNOSIS — Y95 Nosocomial condition: Secondary | ICD-10-CM | POA: Diagnosis present

## 2018-03-01 DIAGNOSIS — Z9049 Acquired absence of other specified parts of digestive tract: Secondary | ICD-10-CM

## 2018-03-01 DIAGNOSIS — Z7984 Long term (current) use of oral hypoglycemic drugs: Secondary | ICD-10-CM

## 2018-03-01 DIAGNOSIS — A419 Sepsis, unspecified organism: Principal | ICD-10-CM | POA: Diagnosis present

## 2018-03-01 DIAGNOSIS — C3490 Malignant neoplasm of unspecified part of unspecified bronchus or lung: Secondary | ICD-10-CM

## 2018-03-01 DIAGNOSIS — Z87442 Personal history of urinary calculi: Secondary | ICD-10-CM

## 2018-03-01 DIAGNOSIS — Z5112 Encounter for antineoplastic immunotherapy: Secondary | ICD-10-CM | POA: Insufficient documentation

## 2018-03-01 DIAGNOSIS — Z9221 Personal history of antineoplastic chemotherapy: Secondary | ICD-10-CM

## 2018-03-01 DIAGNOSIS — H5710 Ocular pain, unspecified eye: Secondary | ICD-10-CM | POA: Insufficient documentation

## 2018-03-01 LAB — COMPREHENSIVE METABOLIC PANEL
ALBUMIN: 3.3 g/dL — AB (ref 3.5–5.0)
ALT: 17 U/L (ref 0–44)
ANION GAP: 13 (ref 5–15)
AST: 19 U/L (ref 15–41)
Alkaline Phosphatase: 87 U/L (ref 38–126)
BILIRUBIN TOTAL: 0.5 mg/dL (ref 0.3–1.2)
BUN: 24 mg/dL — AB (ref 8–23)
CO2: 22 mmol/L (ref 22–32)
Calcium: 9.2 mg/dL (ref 8.9–10.3)
Chloride: 96 mmol/L — ABNORMAL LOW (ref 98–111)
Creatinine, Ser: 1.13 mg/dL — ABNORMAL HIGH (ref 0.44–1.00)
GFR calc Af Amer: 58 mL/min — ABNORMAL LOW (ref >60)
GFR, EST NON AFRICAN AMERICAN: 50 mL/min — AB (ref >60)
Glucose, Bld: 321 mg/dL — ABNORMAL HIGH (ref 70–99)
POTASSIUM: 3.8 mmol/L (ref 3.5–5.1)
Sodium: 131 mmol/L — ABNORMAL LOW (ref 135–145)
TOTAL PROTEIN: 6.7 g/dL (ref 6.5–8.1)

## 2018-03-01 LAB — URINALYSIS, COMPLETE (UACMP) WITH MICROSCOPIC
BILIRUBIN URINE: NEGATIVE
Bacteria, UA: NONE SEEN
HGB URINE DIPSTICK: NEGATIVE
KETONES UR: 5 mg/dL — AB
LEUKOCYTES UA: NEGATIVE
Nitrite: NEGATIVE
Protein, ur: NEGATIVE mg/dL
Specific Gravity, Urine: 1.024 (ref 1.005–1.030)
pH: 5 (ref 5.0–8.0)

## 2018-03-01 LAB — CBC WITH DIFFERENTIAL/PLATELET
BASOS ABS: 0 10*3/uL (ref 0–0.1)
Basophils Relative: 0 %
Eosinophils Absolute: 0 10*3/uL (ref 0–0.7)
Eosinophils Relative: 0 %
HEMATOCRIT: 33.3 % — AB (ref 35.0–47.0)
Hemoglobin: 11.5 g/dL — ABNORMAL LOW (ref 12.0–16.0)
LYMPHS ABS: 0.2 10*3/uL — AB (ref 1.0–3.6)
LYMPHS PCT: 2 %
MCH: 32.7 pg (ref 26.0–34.0)
MCHC: 34.6 g/dL (ref 32.0–36.0)
MCV: 94.5 fL (ref 80.0–100.0)
Monocytes Absolute: 0.6 10*3/uL (ref 0.2–0.9)
Monocytes Relative: 7 %
Neutro Abs: 8.7 10*3/uL — ABNORMAL HIGH (ref 1.4–6.5)
Neutrophils Relative %: 91 %
Platelets: 177 10*3/uL (ref 150–440)
RBC: 3.53 MIL/uL — AB (ref 3.80–5.20)
RDW: 19.2 % — ABNORMAL HIGH (ref 11.5–14.5)
WBC: 9.6 10*3/uL (ref 3.6–11.0)

## 2018-03-01 LAB — GLUCOSE, CAPILLARY: Glucose-Capillary: 354 mg/dL — ABNORMAL HIGH (ref 70–99)

## 2018-03-01 LAB — LACTIC ACID, PLASMA: Lactic Acid, Venous: 1.7 mmol/L (ref 0.5–1.9)

## 2018-03-01 LAB — MRSA PCR SCREENING: MRSA by PCR: NEGATIVE

## 2018-03-01 MED ORDER — INSULIN ASPART 100 UNIT/ML ~~LOC~~ SOLN
0.0000 [IU] | Freq: Three times a day (TID) | SUBCUTANEOUS | Status: DC
  Administered 2018-03-01: 9 [IU] via SUBCUTANEOUS
  Administered 2018-03-02 (×4): 3 [IU] via SUBCUTANEOUS
  Administered 2018-03-03: 10:00:00 2 [IU] via SUBCUTANEOUS
  Administered 2018-03-03 – 2018-03-04 (×5): 3 [IU] via SUBCUTANEOUS
  Administered 2018-03-04: 1 [IU] via SUBCUTANEOUS
  Administered 2018-03-04 – 2018-03-05 (×2): 5 [IU] via SUBCUTANEOUS
  Administered 2018-03-05: 09:00:00 2 [IU] via SUBCUTANEOUS
  Administered 2018-03-05 (×2): 3 [IU] via SUBCUTANEOUS
  Administered 2018-03-06: 08:00:00 2 [IU] via SUBCUTANEOUS
  Administered 2018-03-06: 13:00:00 7 [IU] via SUBCUTANEOUS
  Administered 2018-03-06: 2 [IU] via SUBCUTANEOUS
  Administered 2018-03-06: 3 [IU] via SUBCUTANEOUS
  Administered 2018-03-07: 22:00:00 2 [IU] via SUBCUTANEOUS
  Administered 2018-03-07: 1 [IU] via SUBCUTANEOUS
  Administered 2018-03-07: 5 [IU] via SUBCUTANEOUS
  Administered 2018-03-08: 7 [IU] via SUBCUTANEOUS
  Administered 2018-03-08: 09:00:00 1 [IU] via SUBCUTANEOUS
  Filled 2018-03-01 (×24): qty 1

## 2018-03-01 MED ORDER — CEFEPIME HCL 2 G IJ SOLR
2.0000 g | Freq: Two times a day (BID) | INTRAMUSCULAR | Status: DC
Start: 2018-03-01 — End: 2018-03-05
  Administered 2018-03-01 – 2018-03-04 (×7): 2 g via INTRAVENOUS
  Filled 2018-03-01 (×10): qty 2

## 2018-03-01 MED ORDER — HYDROCOD POLST-CPM POLST ER 10-8 MG/5ML PO SUER
5.0000 mL | Freq: Two times a day (BID) | ORAL | Status: DC | PRN
  Administered 2018-03-01 – 2018-03-07 (×3): 5 mL via ORAL
  Filled 2018-03-01 (×3): qty 5

## 2018-03-01 MED ORDER — ROSUVASTATIN CALCIUM 20 MG PO TABS
20.0000 mg | ORAL_TABLET | Freq: Every evening | ORAL | Status: DC
  Administered 2018-03-02 – 2018-03-09 (×8): 20 mg via ORAL
  Filled 2018-03-01 (×8): qty 1

## 2018-03-01 MED ORDER — ASPIRIN EC 81 MG PO TBEC
81.0000 mg | DELAYED_RELEASE_TABLET | Freq: Two times a day (BID) | ORAL | Status: DC
  Administered 2018-03-01 – 2018-03-10 (×17): 81 mg via ORAL
  Filled 2018-03-01 (×18): qty 1

## 2018-03-01 MED ORDER — METFORMIN HCL 500 MG PO TABS
1000.0000 mg | ORAL_TABLET | Freq: Every day | ORAL | Status: DC
  Administered 2018-03-02: 08:00:00 1000 mg via ORAL
  Filled 2018-03-01: qty 2

## 2018-03-01 MED ORDER — INSULIN ASPART 100 UNIT/ML ~~LOC~~ SOLN: 0.0000 [IU] | Freq: Three times a day (TID) | SUBCUTANEOUS | Status: DC

## 2018-03-01 MED ORDER — ACETAMINOPHEN 500 MG PO TABS
1000.0000 mg | ORAL_TABLET | Freq: Once | ORAL | Status: AC
  Administered 2018-03-01: 1000 mg via ORAL
  Filled 2018-03-01: qty 2

## 2018-03-01 MED ORDER — HEPARIN SOD (PORK) LOCK FLUSH 100 UNIT/ML IV SOLN
500.0000 [IU] | Freq: Once | INTRAVENOUS | Status: DC
  Filled 2018-03-01: qty 5

## 2018-03-01 MED ORDER — LEVOFLOXACIN IN D5W 500 MG/100ML IV SOLN
500.0000 mg | Freq: Once | INTRAVENOUS | Status: AC
  Administered 2018-03-01: 500 mg via INTRAVENOUS
  Filled 2018-03-01: qty 100

## 2018-03-01 MED ORDER — ACETAMINOPHEN 325 MG PO TABS
650.0000 mg | ORAL_TABLET | Freq: Four times a day (QID) | ORAL | Status: DC | PRN
  Administered 2018-03-01 – 2018-03-07 (×13): 650 mg via ORAL
  Filled 2018-03-01 (×13): qty 2

## 2018-03-01 MED ORDER — HEPARIN SODIUM (PORCINE) 5000 UNIT/ML IJ SOLN
5000.0000 [IU] | Freq: Three times a day (TID) | INTRAMUSCULAR | Status: DC
  Administered 2018-03-01 – 2018-03-02 (×2): 5000 [IU] via SUBCUTANEOUS
  Filled 2018-03-01 (×3): qty 1

## 2018-03-01 MED ORDER — HYDROCOD POLST-CPM POLST ER 10-8 MG/5ML PO SUER: 5.0000 mL | Freq: Two times a day (BID) | ORAL | 0 refills | Status: AC | PRN

## 2018-03-01 MED ORDER — IPRATROPIUM-ALBUTEROL 0.5-2.5 (3) MG/3ML IN SOLN
RESPIRATORY_TRACT | Status: AC
  Filled 2018-03-01: qty 3

## 2018-03-01 MED ORDER — MORPHINE SULFATE (PF) 2 MG/ML IV SOLN
2.0000 mg | INTRAVENOUS | Status: DC | PRN
  Administered 2018-03-01: 2 mg via INTRAVENOUS
  Filled 2018-03-01: qty 1

## 2018-03-01 MED ORDER — ACETAMINOPHEN ER 650 MG PO TBCR: 650.0000 mg | EXTENDED_RELEASE_TABLET | Freq: Three times a day (TID) | ORAL | Status: DC | PRN

## 2018-03-01 MED ORDER — DOCUSATE SODIUM 100 MG PO CAPS
100.0000 mg | ORAL_CAPSULE | Freq: Two times a day (BID) | ORAL | Status: DC
  Administered 2018-03-02 – 2018-03-09 (×16): 100 mg via ORAL
  Filled 2018-03-01 (×17): qty 1

## 2018-03-01 MED ORDER — BENZONATATE 100 MG PO CAPS
200.0000 mg | ORAL_CAPSULE | Freq: Three times a day (TID) | ORAL | Status: DC | PRN
  Administered 2018-03-02 (×2): 200 mg via ORAL
  Filled 2018-03-01 (×2): qty 2

## 2018-03-01 MED ORDER — HYDROCOD POLST-CPM POLST ER 10-8 MG/5ML PO SUER
5.0000 mL | Freq: Once | ORAL | Status: AC
  Administered 2018-03-01: 5 mL via ORAL
  Filled 2018-03-01: qty 5

## 2018-03-01 MED ORDER — MORPHINE SULFATE 2 MG/ML IJ SOLN
2.0000 mg | Freq: Once | INTRAMUSCULAR | Status: AC
  Administered 2018-03-01: 2 mg via INTRAVENOUS
  Filled 2018-03-01: qty 1

## 2018-03-01 MED ORDER — ONDANSETRON HCL 4 MG PO TABS: 8.0000 mg | ORAL_TABLET | Freq: Two times a day (BID) | ORAL | Status: DC | PRN

## 2018-03-01 MED ORDER — SODIUM CHLORIDE 0.9 % IV SOLN
INTRAVENOUS | Status: DC
  Administered 2018-03-01 – 2018-03-02 (×2): via INTRAVENOUS

## 2018-03-01 MED ORDER — PANTOPRAZOLE SODIUM 40 MG PO TBEC
40.0000 mg | DELAYED_RELEASE_TABLET | Freq: Every day | ORAL | Status: DC
  Administered 2018-03-02 – 2018-03-09 (×8): 40 mg via ORAL
  Filled 2018-03-01 (×8): qty 1

## 2018-03-01 MED ORDER — PREDNISONE 5 MG PO TABS: 5.0000 mg | ORAL_TABLET | Freq: Every day | ORAL | 0 refills | Status: DC

## 2018-03-01 MED ORDER — PREDNISONE 10 MG PO TABS
5.0000 mg | ORAL_TABLET | Freq: Every day | ORAL | Status: DC
  Administered 2018-03-02 – 2018-03-10 (×9): 5 mg via ORAL
  Filled 2018-03-01 (×9): qty 1

## 2018-03-01 MED ORDER — MORPHINE SULFATE (PF) 4 MG/ML IV SOLN
4.0000 mg | INTRAVENOUS | Status: DC | PRN
  Administered 2018-03-01: 4 mg via INTRAVENOUS
  Filled 2018-03-01: qty 1

## 2018-03-01 MED ORDER — SODIUM CHLORIDE 0.9% FLUSH
10.0000 mL | INTRAVENOUS | Status: DC | PRN
  Administered 2018-03-01: 10 mL via INTRAVENOUS
  Filled 2018-03-01: qty 10

## 2018-03-01 MED ORDER — ONDANSETRON HCL 4 MG/2ML IJ SOLN
4.0000 mg | Freq: Four times a day (QID) | INTRAMUSCULAR | Status: DC | PRN
  Administered 2018-03-01 – 2018-03-07 (×4): 4 mg via INTRAVENOUS
  Filled 2018-03-01 (×4): qty 2

## 2018-03-01 MED ORDER — SODIUM CHLORIDE 0.9 % IV SOLN
Freq: Once | INTRAVENOUS | Status: AC
  Administered 2018-03-01: 11:00:00 via INTRAVENOUS
  Filled 2018-03-01: qty 1000

## 2018-03-01 MED ORDER — METOPROLOL SUCCINATE ER 25 MG PO TB24
25.0000 mg | ORAL_TABLET | Freq: Every day | ORAL | Status: DC
  Administered 2018-03-03 – 2018-03-10 (×8): 25 mg via ORAL
  Filled 2018-03-01 (×8): qty 1

## 2018-03-01 MED ORDER — VITAMIN B-12 1000 MCG PO TABS
1000.0000 ug | ORAL_TABLET | Freq: Every day | ORAL | Status: DC
  Administered 2018-03-02 – 2018-03-10 (×9): 1000 ug via ORAL
  Filled 2018-03-01 (×9): qty 1

## 2018-03-01 MED ORDER — ONDANSETRON HCL 4 MG/2ML IJ SOLN
4.0000 mg | Freq: Once | INTRAMUSCULAR | Status: AC
  Administered 2018-03-01: 4 mg via INTRAVENOUS
  Filled 2018-03-01: qty 2

## 2018-03-01 MED ORDER — IOPAMIDOL (ISOVUE-300) INJECTION 61%: 15.0000 mL | INTRAVENOUS | Status: AC

## 2018-03-01 MED ORDER — VANCOMYCIN HCL IN DEXTROSE 1-5 GM/200ML-% IV SOLN
1000.0000 mg | Freq: Once | INTRAVENOUS | Status: AC
  Administered 2018-03-01: 19:00:00 1000 mg via INTRAVENOUS
  Filled 2018-03-01: qty 200

## 2018-03-01 MED ORDER — ALBUTEROL SULFATE (2.5 MG/3ML) 0.083% IN NEBU
3.0000 mL | INHALATION_SOLUTION | Freq: Four times a day (QID) | RESPIRATORY_TRACT | Status: DC | PRN
  Administered 2018-03-02 – 2018-03-10 (×5): 3 mL via RESPIRATORY_TRACT
  Filled 2018-03-01 (×5): qty 3

## 2018-03-01 MED ORDER — VITAMIN D 1000 UNITS PO TABS
1000.0000 [IU] | ORAL_TABLET | Freq: Every day | ORAL | Status: DC
  Administered 2018-03-02 – 2018-03-10 (×9): 1000 [IU] via ORAL
  Filled 2018-03-01 (×9): qty 1

## 2018-03-01 MED ORDER — SODIUM CHLORIDE 0.9 % IV BOLUS
500.0000 mL | Freq: Once | INTRAVENOUS | Status: AC
  Administered 2018-03-01: 20:00:00 500 mL via INTRAVENOUS

## 2018-03-01 MED ORDER — IPRATROPIUM-ALBUTEROL 0.5-2.5 (3) MG/3ML IN SOLN
3.0000 mL | Freq: Once | RESPIRATORY_TRACT | Status: AC
Start: 2018-03-01 — End: 2018-03-01
  Administered 2018-03-01: 3 mL via RESPIRATORY_TRACT

## 2018-03-01 MED ORDER — INSULIN ASPART 100 UNIT/ML ~~LOC~~ SOLN: 0.0000 [IU] | Freq: Every day | SUBCUTANEOUS | Status: DC

## 2018-03-01 MED ORDER — LORAZEPAM 0.5 MG PO TABS
0.5000 mg | ORAL_TABLET | Freq: Three times a day (TID) | ORAL | Status: DC | PRN
  Administered 2018-03-01 – 2018-03-10 (×5): 0.5 mg via ORAL
  Filled 2018-03-01 (×5): qty 1

## 2018-03-01 MED ORDER — VANCOMYCIN HCL IN DEXTROSE 1-5 GM/200ML-% IV SOLN
1000.0000 mg | INTRAVENOUS | Status: DC
  Administered 2018-03-02: 02:00:00 1000 mg via INTRAVENOUS
  Filled 2018-03-01 (×3): qty 200

## 2018-03-01 MED ORDER — DOCUSATE SODIUM 100 MG PO CAPS: 100.0000 mg | ORAL_CAPSULE | Freq: Two times a day (BID) | ORAL | Status: DC | PRN

## 2018-03-01 NOTE — Progress Notes (Signed)
Patient here as an acute add on for cough, fever, fatigue, weakness and back and left side pain.

## 2018-03-01 NOTE — Telephone Encounter (Signed)
Daughter called reporting that patient is not feeling well, she has low grade fever 100.2, fatigue (can hardly keep her eyes open), breathing issues. She is s/p chemotherapy radiation therapy. Appointment accepted for [facility] at [phone]

## 2018-03-01 NOTE — H&P (Signed)
[facility] [place] NAME: Dana Ramirez    MR#:  [id number]  DATE OF BIRTH:  September 11, 1950  DATE OF ADMISSION:  03/01/2018  PRIMARY CARE PHYSICIAN: Crecencio Mc, MD   REQUESTING/REFERRING PHYSICIAN: Talbert Cage  CHIEF COMPLAINT:  No chief complaint on file.   HISTORY OF PRESENT ILLNESS: Dana Ramirez  is a 67 y.o. female with a known history of CAD, DM, HLD, Htn, Kidney stone- recent diagnosis of non small cell lung cancer- started on Chemo+ radiation, last dose 2 weeks ago. Started having fever since yesterday and went to cancer center today.  They did CT scan of the chest which showed possible pneumonia, patient was also having nausea and discomfort and they were not able to give her oral antibiotics are suggested to admit to hospitalist service for further management. Patient also have pain in right side flank.  PAST MEDICAL HISTORY:   Past Medical History:  Diagnosis Date  . CAD (coronary artery disease)   . Carotid arterial disease ([place])   . Diabetes mellitus without complication ([place])   . History of vertigo   . Hyperlipidemia   . Hypertension   . Kidney stone     PAST SURGICAL HISTORY:  Past Surgical History:  Procedure Laterality Date  . BREAST CYST ASPIRATION Left 1980s  . CESAREAN SECTION     x 2  . CHOLECYSTECTOMY    . CORONARY ARTERY BYPASS GRAFT  03/2005   LIMA to the LAD, vein graft to diagonal with a negative stress test in 06/2006  . PORTACATH PLACEMENT Left 12/31/2017   Procedure: INSERTION PORT-A-CATH;  Surgeon: Robert Bellow, MD;  Location: [facility] ORS;  Service: General;  Laterality: Left;  . SENTINEL NODE BIOPSY Right 12/31/2017   Procedure: SUPRACLAVICULAR NODE BIOPSY;  Surgeon: Robert Bellow, MD;  Location: [facility] ORS;  Service: General;  Laterality: Right;  . TONSILLECTOMY      SOCIAL HISTORY:  Social History   Tobacco Use  . Smoking status: Never Smoker  . Smokeless tobacco: Never Used  Substance Use  Topics  . Alcohol use: No    FAMILY HISTORY:  Family History  Problem Relation Age of Onset  . Heart disease Mother   . Heart disease Father 30  . Stroke Sister 68  . Heart disease Sister   . Diabetes Brother   . Coronary artery disease Other        Strong family history of CAD and CABG  . Heart disease Maternal Grandmother   . Breast cancer Maternal Grandmother   . Heart disease Maternal Grandfather   . Heart disease Paternal Grandmother   . Breast cancer Paternal Grandmother 59  . Heart disease Paternal Grandfather     DRUG ALLERGIES:  Allergies  Allergen Reactions  . Codeine Nausea And Vomiting  . Lisinopril Cough  . Penicillins Itching    Has patient had a PCN reaction causing immediate rash, facial/tongue/throat swelling, SOB or lightheadedness with hypotension: no Has patient had a PCN reaction causing severe rash involving mucus membranes or skin necrosis: no Has patient had a PCN reaction that required hospitalization: no Has patient had a PCN reaction occurring within the last 10 years: no If all of the above answers are "NO", then may proceed with Cephalosporin use.     REVIEW OF SYSTEMS:   CONSTITUTIONAL: have fever, fatigue or weakness.  EYES: No blurred or double vision.  EARS, NOSE, AND THROAT: No tinnitus or ear pain.  RESPIRATORY: Have cough,  shortness of breath,no wheezing or hemoptysis.  CARDIOVASCULAR: No chest pain, orthopnea, edema.  GASTROINTESTINAL: No nausea, vomiting, diarrhea or abdominal pain.  GENITOURINARY: No dysuria, hematuria.  ENDOCRINE: No polyuria, nocturia,  HEMATOLOGY: No anemia, easy bruising or bleeding SKIN: No rash or lesion. MUSCULOSKELETAL: No joint pain or arthritis.   NEUROLOGIC: No tingling, numbness, weakness.  PSYCHIATRY: No anxiety or depression.   MEDICATIONS AT HOME:  Prior to Admission medications   Medication Sig Start Date End Date Taking? Authorizing Provider  acetaminophen (TYLENOL) 650 MG CR tablet Take  650 mg by mouth every 8 (eight) hours as needed for pain.    [provider]  albuterol (PROVENTIL HFA;VENTOLIN HFA) 108 (90 Base) MCG/ACT inhaler Inhale 2 puffs into the lungs every 6 (six) hours as needed for wheezing or shortness of breath. Patient not taking: Reported on 03/01/2018 12/21/17   Earlie Server, MD  aspirin (ASPIR-LOW) 81 MG EC tablet Take 81 mg by mouth 2 (two) times daily.     [provider]  chlorpheniramine-HYDROcodone (TUSSIONEX) 10-8 MG/5ML SUER Take 5 mLs by mouth every 12 (twelve) hours as needed for cough. 03/01/18   Verlon Au, NP  Cholecalciferol (VITAMIN D3) 1000 units CAPS Take 1 capsule by mouth daily.    [provider]  cyanocobalamin 1000 MCG tablet Take 1,000 mcg by mouth daily.    [provider]  cyclobenzaprine (FLEXERIL) 10 MG tablet Take 1 tablet (10 mg total) by mouth 3 (three) times daily as needed for muscle spasms. Patient not taking: Reported on 03/01/2018 11/04/17   Crecencio Mc, MD  glipiZIDE (GLUCOTROL XL) 2.5 MG 24 hr tablet TAKE ONE TABLET BY MOUTH EVERY MORNING WITH BREAKFAST Patient not taking: Reported on 03/01/2018 07/28/17   Crecencio Mc, MD  JANUVIA 100 MG tablet TAKE ONE TABLET BY MOUTH DAILY 08/13/17   Crecencio Mc, MD  lidocaine-prilocaine (EMLA) cream Apply to affected area once 01/06/18   Earlie Server, MD  LORazepam (ATIVAN) 0.5 MG tablet Take 1 tablet (0.5 mg total) by mouth every 8 (eight) hours as needed for anxiety (nausea). 02/11/18   Earlie Server, MD  metFORMIN (GLUCOPHAGE) 1000 MG tablet TAKE ONE TABLET BY MOUTH DAILY 05/20/17   Crecencio Mc, MD  metoprolol succinate (TOPROL-XL) 25 MG 24 hr tablet TAKE 1 TABLET DAILY 09/03/17   Minna Merritts, MD  naproxen sodium (ALEVE) 220 MG tablet Take 220 mg by mouth.    [provider]  omeprazole (PRILOSEC OTC) 20 MG tablet Take 20 mg by mouth daily.    [provider]  ondansetron (ZOFRAN) 8 MG tablet Take 1 tablet (8 mg total) by mouth 2 (two)  times daily as needed for refractory nausea / vomiting. Start on day 3 after chemo. 01/06/18   Earlie Server, MD  predniSONE (DELTASONE) 5 MG tablet Take 1 tablet (5 mg total) by mouth daily with breakfast. 03/01/18   Verlon Au, NP  prochlorperazine (COMPAZINE) 10 MG tablet Take 1 tablet (10 mg total) by mouth every 6 (six) hours as needed (Nausea or vomiting). Patient not taking: Reported on 02/09/2018 01/06/18   Earlie Server, MD  rosuvastatin (CRESTOR) 20 MG tablet TAKE ONE TABLET BY MOUTH DAILY 11/15/17   Minna Merritts, MD  silver sulfADIAZINE (SILVADENE) 1 % cream Apply 1 application topically 2 (two) times daily. 02/18/18   Noreene Filbert, MD  traMADol (ULTRAM) 50 MG tablet Take 1 tablet (50 mg total) by mouth every 6 (six) hours as needed. Patient  not taking: Reported on 02/09/2018 12/07/17   Crecencio Mc, MD      PHYSICAL EXAMINATION:   VITAL SIGNS: Blood pressure (!) 118/103, pulse 85, temperature (!) 101.8 F (38.8 C), temperature source Oral, resp. rate 20, SpO2 91 %.  GENERAL:  67 y.o.-year-old patient lying in the bed with acute distress, due to fever and pain in back. EYES: Pupils equal, round, reactive to light and accommodation. No scleral icterus. Extraocular muscles intact.  HEENT: Head atraumatic, normocephalic. Oropharynx and nasopharynx clear.  NECK:  Supple, no jugular venous distention. No thyroid enlargement, no tenderness.  LUNGS: Normal breath sounds bilaterally, no wheezing, have crepitation. No use of accessory muscles of respiration.  CARDIOVASCULAR: S1, S2 normal. No murmurs, rubs, or gallops.  ABDOMEN: Soft, nontender, nondistended. Bowel sounds present. No organomegaly or mass.  EXTREMITIES: No pedal edema, cyanosis, or clubbing.  NEUROLOGIC: Cranial nerves II through XII are intact. Muscle strength 4/5 in all extremities. Sensation intact. Gait not checked.  PSYCHIATRIC: The patient is alert and oriented x 3.  SKIN: No obvious rash, lesion, or ulcer.    LABORATORY PANEL:   CBC Recent Labs  Lab 03/01/18 0915  WBC 9.6  HGB 11.5*  HCT 33.3*  PLT 177  MCV 94.5  MCH 32.7  MCHC 34.6  RDW 19.2*  LYMPHSABS 0.2*  MONOABS 0.6  EOSABS 0.0  BASOSABS 0.0   ------------------------------------------------------------------------------------------------------------------  Chemistries  Recent Labs  Lab 03/01/18 0915  NA 131*  K 3.8  CL 96*  CO2 22  GLUCOSE 321*  BUN 24*  CREATININE 1.13*  CALCIUM 9.2  AST 19  ALT 17  ALKPHOS 87  BILITOT 0.5   ------------------------------------------------------------------------------------------------------------------ estimated creatinine clearance is 45.9 mL/min (A) (by C-G formula based on SCr of 1.13 mg/dL (H)). ------------------------------------------------------------------------------------------------------------------ No results for input(s): TSH, T4TOTAL, T3FREE, THYROIDAB in the last 72 hours.  Invalid input(s): FREET3   Coagulation profile No results for input(s): INR, PROTIME in the last 168 hours. ------------------------------------------------------------------------------------------------------------------- No results for input(s): DDIMER in the last 72 hours. -------------------------------------------------------------------------------------------------------------------  Cardiac Enzymes No results for input(s): CKMB, TROPONINI, MYOGLOBIN in the last 168 hours.  Invalid input(s): CK ------------------------------------------------------------------------------------------------------------------ Invalid input(s): POCBNP  ---------------------------------------------------------------------------------------------------------------  Urinalysis    Component Value Date/Time   COLORURINE YELLOW (A) 03/01/2018 1100   APPEARANCEUR CLEAR (A) 03/01/2018 1100   LABSPEC 1.024 03/01/2018 1100   PHURINE 5.0 03/01/2018 1100   GLUCOSEU >=500 (A) 03/01/2018 1100    HGBUR NEGATIVE 03/01/2018 1100   BILIRUBINUR NEGATIVE 03/01/2018 1100   BILIRUBINUR neg 08/02/2015 1433   KETONESUR 5 (A) 03/01/2018 1100   PROTEINUR NEGATIVE 03/01/2018 1100   UROBILINOGEN 0.2 08/02/2015 1433   NITRITE NEGATIVE 03/01/2018 1100   LEUKOCYTESUR NEGATIVE 03/01/2018 1100     RADIOLOGY: Ct Chest Wo Contrast  Result Date: 03/01/2018 CLINICAL DATA:  Cough and shortness of breath beginning last night. The patient is undergoing chemotherapy and radiation therapy for lung carcinoma. EXAM: CT CHEST WITHOUT CONTRAST TECHNIQUE: Multidetector CT imaging of the chest was performed following the standard protocol without IV contrast. COMPARISON:  PET CT scan 12/24/2017. FINDINGS: Cardiovascular: The patient is status post CABG. Heart size is normal. Calcific aortic atherosclerosis is identified. No pericardial effusion. Mediastinum/Nodes: Previously seen 1.5 cm short axis dimension right supraclavicular node measures 0.8 cm on image 5 of series 2 today. Right suprahilar mass seen on the prior examination measures 4.4 x 2.7 cm today on image 32 compared to 4.9 x 3.6 cm on the prior study (remeasurement). The lesion abuts the  right subclavian artery as on the prior exam. A right infrahilar mass lesion encasing the right mainstem bronchus measures 3.9 x 3.9 cm on image 67 compared to 4.3 x 4.3 cm on the prior exam. There is less mass effect on the right mainstem bronchus. The thyroid gland and esophagus are unremarkable. Lungs/Pleura: No pleural effusion. Thickened pleura and pleural calcification in the right lung base are unchanged. The lungs are emphysematous. Increased airspace opacity in the posterior right lower lobe since the prior exam is seen medially and worrisome for pneumonia. Scattered, patchy areas of ground-glass attenuation throughout the right lung are not grossly changed. The left lung is clear. Upper Abdomen: A soft tissue nodule measuring 1.8 x 1.6 cm on image 125 posterior to the  medial limb of the right adrenal gland is new since the prior study. Also seen is new fullness of the visualized left intrarenal collecting system which is new since the prior exam. Musculoskeletal: No lytic or sclerotic lesion is identified. IMPRESSION: Some increase in airspace opacity in the right lower lobe worrisome for pneumonia. Lung carcinoma with metastatic lymphadenopathy appears improved as described above. Visualized left intrarenal collecting system is dilated worrisome for hydronephrosis, new since the prior examination. Cause for this finding is not identified. Renal ultrasound is recommended for further evaluation. New soft tissue nodule along the medial aspect of the posterior right hemidiaphragm is worrisome for metastatic disease. Aortic Atherosclerosis (ICD10-I70.0) and Emphysema (ICD10-J43.9). Electronically Signed   By: Inge Rise M.D.   On: 03/01/2018 12:37    EKG: Orders placed or performed in visit on 04/07/17  . EKG 12-Lead    IMPRESSION AND PLAN:  *Healthcare associated pneumonia This patient is a cancer patient and on chemotherapy, I will start on vancomycin and cefepime for now and check for MRSA screen. Will give Tylenol for symptomatic coverage for fever. Blood culture to be sent.  * Nausea and flank pain Urinalysis is negative. CT scan abdomen without contrast was planned by oncologist, I will get it done here.  *Diabetes As patient's oral intake is not very great now, I will hold glipizide and Januvia and continue metformin and keep her on sliding scale coverage.  *Hypertension    Metoprolol.  * Hx of CAD   Cont ASA, Statin, metoprolol  * Lung cancer   On chemo and radiation therapy As planned, will get CT scan abdomen without contrast and let oncologist following hospital.  All the records are reviewed and case discussed with ED provider. Management plans discussed with the patient, family and they are in agreement.  CODE STATUS: DNR     Code Status Orders  (From admission, onward)         Start     Ordered   03/01/18 1750  Full code  Continuous     03/01/18 1749        Code Status History    This patient has a current code status but no historical code status.     Patient's husband, son, daughter are present in the room during my visit.  TOTAL TIME TAKING CARE OF THIS PATIENT: 50 minutes.    Vaughan Basta M.D on 03/01/2018   Between 7am to 6pm - Pager - [phone]  After 6pm go to [email or web address] - password [email or web address] [facility]  Office  [phone]  CC: Primary care physician; Crecencio Mc, MD   Note: This dictation was prepared with Dragon dictation along with smaller phrase technology. Any transcriptional errors that result  from this process are unintentional.

## 2018-03-01 NOTE — Consult Note (Signed)
Pharmacy Antibiotic Note  Dana Ramirez is a 67 y.o. female admitted on 03/01/2018 with pneumonia.  Pharmacy has been consulted for Vancomycin and Cefepime dosing.  Plan: Ke: 0.042   T1/2: 16.5  Vd: 46.2  Vancomycin 1000 IV every 24 hours with 7 hour stack dosing.  Goal trough 15-20 mcg/mL. Calculated trough at Css is 153.4. Trough level ordered prior to 4th dose. MRSA PCR ordered- if negative, recommend discontinuing vancomycin.  Start cefepime 2g IV every 12 hours.   Height: 5\' 3"  (160 cm) Weight: 145 lb (65.8 kg) IBW/kg (Calculated) : 52.4  Temp (24hrs), Avg:100.4 F (38 C), Min:98.9 F (37.2 C), Max:101.8 F (38.8 C)  Recent Labs  Lab 03/01/18 0915  WBC 9.6  CREATININE 1.13*    Estimated Creatinine Clearance: 45.3 mL/min (A) (by C-G formula based on SCr of 1.13 mg/dL (H)).    Allergies  Allergen Reactions  . Codeine Nausea And Vomiting  . Lisinopril Cough  . Penicillins Itching    Has patient had a PCN reaction causing immediate rash, facial/tongue/throat swelling, SOB or lightheadedness with hypotension: no Has patient had a PCN reaction causing severe rash involving mucus membranes or skin necrosis: no Has patient had a PCN reaction that required hospitalization: no Has patient had a PCN reaction occurring within the last 10 years: no If all of the above answers are "NO", then may proceed with Cephalosporin use.     Antimicrobials this admission: 8/13 Vancomycin  >>  8/13 Cefepime >>   Microbiology results: 8/13 BCx: pending 8/13 MRSA PCR: pending  Thank you for allowing pharmacy to be a part of this patient's care.  Pernell Dupre, PharmD, BCPS Clinical Pharmacist 03/01/2018 7:11 PM

## 2018-03-01 NOTE — Progress Notes (Signed)
[facility] [place] [facility]  [phone] [phone]  Patient Care Team: Crecencio Mc, MD as PCP - General (Internal Medicine) Minna Merritts, MD (Cardiology) Telford Nab, RN as Registered Nurse Bary Castilla, Forest Gleason, MD (General Surgery) Earlie Server, MD as Medical Oncologist (Medical Oncology)   Name of the patient: Dana Ramirez  [id number]  January 23, 1951   Date of visit: 03/01/18  Diagnosis- Stage IIIB NSCLC  Chief complaint/ Reason for visit- cough & fever  Heme/Onc history:    Lung cancer ([place])   01/06/2018 Initial Diagnosis    Lung cancer ([place])    01/06/2018 -  Chemotherapy    The patient had palonosetron (ALOXI) injection 0.25 mg, 0.25 mg, Intravenous,  Once, 5 of 7 cycles Administration: 0.25 mg (01/12/2018), 0.25 mg (01/19/2018), 0.25 mg (02/09/2018), 0.25 mg (01/27/2018), 0.25 mg (02/02/2018) CARBOplatin (PARAPLATIN) 170 mg in sodium chloride 0.9 % 250 mL chemo infusion, 170 mg (100 % of original dose 171.4 mg), Intravenous,  Once, 5 of 7 cycles Dose modification:   (original dose 171.4 mg, Cycle 1),   (original dose 171.4 mg, Cycle 1),   (original dose 171.4 mg, Cycle 1) Administration: 170 mg (01/12/2018), 170 mg (01/19/2018), 170 mg (02/09/2018), 170 mg (01/27/2018), 170 mg (02/02/2018) PACLitaxel (TAXOL) 78 mg in sodium chloride 0.9 % 250 mL chemo infusion (</= 80mg /m2), 45 mg/m2 = 78 mg, Intravenous,  Once, 5 of 7 cycles Administration: 78 mg (01/12/2018), 78 mg (01/19/2018), 78 mg (02/09/2018), 78 mg (01/27/2018), 78 mg (02/02/2018)  for chemotherapy treatment.      Interval history -Dana Ramirez, 67 year old female, receiving treatment for stage IIIB non-small cell lung cancer, [facility] for complaints of cough and fever. She first noticed fever starting yesterday with Tmax at home 100.1. She feels that her symptoms are rapidly worsening and she reports fatigue/malaise, chills, weakness, and  headache. She also complains of some flank pain and has pmh significant for kidney stone. She states she was up coughing all night. Not producing sputum. She tried Tylenol for back pain and low grade fever which improved symptoms briefly but symptoms returned. She has not taken anything for fever or cough this morning. Appetite has been stable until last 24 hours. Reports urine output has been normal but reduced today with poor oral intake.   She endorses sick contacts and has been visiting her father at hospital and rehab.  She was informed today that her father is being transitioned to hospice.  She had radiation skin change to right supraclavicular fossa which was treated with Silvadene cream.  She had a 'stomach bug' approximately 1 month ago and symptoms resolved. She denies any nausea, vomiting, or diarrhea recently. She does state the color of her bowel movements has been lighter recently, but texture, quantity, frequency, is normal.   She has completed a 4-week course of radiation with concurrent chemotherapy with carbotaxol.  Per Dr. Baruch Gouty, she had a large tumor with supraclavicular involvement and plan for split course of radiation.  Plan for maintenance immunotherapy after concurrent chemoradiation.  Currently being down titrated from prednisone for shortness of breath and difficulty swallowing.  She has prescription for albuterol inhaler but has not used it recently and has not had recent wheezing.  ECOG FS:3 - Symptomatic, >50% confined to bed  Review of systems- Review of Systems  Constitutional: Positive for chills, fever and malaise/fatigue. Negative for weight loss.  HENT: Negative for congestion, ear discharge, ear pain, nosebleeds, sinus  pain, sore throat and tinnitus.   Eyes: Negative.   Respiratory: Positive for cough and shortness of breath (with exertion). Negative for hemoptysis, sputum production, wheezing and stridor.   Cardiovascular: Negative for chest pain,  palpitations, orthopnea, claudication and leg swelling.  Gastrointestinal: Negative for abdominal pain, blood in stool, constipation, diarrhea, heartburn, nausea and vomiting.  Genitourinary: Positive for flank pain (left).  Skin: Negative.        Radiation skin changes to right neck  Neurological: Negative for dizziness, tingling, weakness and headaches.  Endo/Heme/Allergies: Negative.   Psychiatric/Behavioral: The patient has insomnia (coughing impaired sleep last night).     Current treatment- concurrent chemo-radiation; s/p cycle 5 carbo-taxol (02/09/18).   Allergies  Allergen Reactions  . Codeine Nausea And Vomiting  . Lisinopril Cough  . Penicillins Itching    Has patient had a PCN reaction causing immediate rash, facial/tongue/throat swelling, SOB or lightheadedness with hypotension: no Has patient had a PCN reaction causing severe rash involving mucus membranes or skin necrosis: no Has patient had a PCN reaction that required hospitalization: no Has patient had a PCN reaction occurring within the last 10 years: no If all of the above answers are "NO", then may proceed with Cephalosporin use.     Past Medical History:  Diagnosis Date  . CAD (coronary artery disease)   . Carotid arterial disease ([place])   . Diabetes mellitus without complication ([place])   . History of vertigo   . Hyperlipidemia   . Hypertension   . Kidney stone     Past Surgical History:  Procedure Laterality Date  . BREAST CYST ASPIRATION Left 1980s  . CESAREAN SECTION     x 2  . CHOLECYSTECTOMY    . CORONARY ARTERY BYPASS GRAFT  03/2005   LIMA to the LAD, vein graft to diagonal with a negative stress test in 06/2006  . PORTACATH PLACEMENT Left 12/31/2017   Procedure: INSERTION PORT-A-CATH;  Surgeon: Robert Bellow, MD;  Location: [facility] ORS;  Service: General;  Laterality: Left;  . SENTINEL NODE BIOPSY Right 12/31/2017   Procedure: SUPRACLAVICULAR NODE BIOPSY;  Surgeon: Robert Bellow, MD;   Location: [facility] ORS;  Service: General;  Laterality: Right;  . TONSILLECTOMY      Social History   Socioeconomic History  . Marital status: Married    Spouse name: Elenore Rota   . Number of children: 2  . Years of education: Not on file  . Highest education level: Not on file  Occupational History  . Occupation: Retired     Fish farm manager: OTHER    Comment: [employer]  Social Needs  . Financial resource strain: Not on file  . Food insecurity:    Worry: Not on file    Inability: Not on file  . Transportation needs:    Medical: Not on file    Non-medical: Not on file  Tobacco Use  . Smoking status: Never Smoker  . Smokeless tobacco: Never Used  Substance and Sexual Activity  . Alcohol use: No  . Drug use: No  . Sexual activity: Not on file  Lifestyle  . Physical activity:    Days per week: Not on file    Minutes per session: Not on file  . Stress: Not on file  Relationships  . Social connections:    Talks on phone: Not on file    Gets together: Not on file    Attends religious service: Not on file    Active member of club or organization: Not on file  Attends meetings of clubs or organizations: Not on file    Relationship status: Not on file  . Intimate partner violence:    Fear of current or ex partner: Not on file    Emotionally abused: Not on file    Physically abused: Not on file    Forced sexual activity: Not on file  Other Topics Concern  . Not on file  Social History Narrative   Married   Gets regular exercise, once or twice every week    Family History  Problem Relation Age of Onset  . Heart disease Mother   . Heart disease Father 87  . Stroke Sister 62  . Heart disease Sister   . Diabetes Brother   . Coronary artery disease Other        Strong family history of CAD and CABG  . Heart disease Maternal Grandmother   . Breast cancer Maternal Grandmother   . Heart disease Maternal Grandfather   . Heart disease Paternal Grandmother   . Breast cancer Paternal  Grandmother 33  . Heart disease Paternal Grandfather      Current Outpatient Medications:  .  acetaminophen (TYLENOL) 650 MG CR tablet, Take 650 mg by mouth every 8 (eight) hours as needed for pain., Disp: , Rfl:  .  aspirin (ASPIR-LOW) 81 MG EC tablet, Take 81 mg by mouth 2 (two) times daily. , Disp: , Rfl:  .  Cholecalciferol (VITAMIN D3) 1000 units CAPS, Take 1 capsule by mouth daily., Disp: , Rfl:  .  cyanocobalamin 1000 MCG tablet, Take 1,000 mcg by mouth daily., Disp: , Rfl:  .  JANUVIA 100 MG tablet, TAKE ONE TABLET BY MOUTH DAILY, Disp: 90 tablet, Rfl: 1 .  lidocaine-prilocaine (EMLA) cream, Apply to affected area once, Disp: 30 g, Rfl: 3 .  LORazepam (ATIVAN) 0.5 MG tablet, Take 1 tablet (0.5 mg total) by mouth every 8 (eight) hours as needed for anxiety (nausea)., Disp: 30 tablet, Rfl: 0 .  metFORMIN (GLUCOPHAGE) 1000 MG tablet, TAKE ONE TABLET BY MOUTH DAILY, Disp: 90 tablet, Rfl: 1 .  metoprolol succinate (TOPROL-XL) 25 MG 24 hr tablet, TAKE 1 TABLET DAILY, Disp: 90 tablet, Rfl: 3 .  naproxen sodium (ALEVE) 220 MG tablet, Take 220 mg by mouth., Disp: , Rfl:  .  omeprazole (PRILOSEC OTC) 20 MG tablet, Take 20 mg by mouth daily., Disp: , Rfl:  .  ondansetron (ZOFRAN) 8 MG tablet, Take 1 tablet (8 mg total) by mouth 2 (two) times daily as needed for refractory nausea / vomiting. Start on day 3 after chemo., Disp: 30 tablet, Rfl: 1 .  predniSONE (DELTASONE) 5 MG tablet, Take 1 tablet (5 mg total) by mouth daily with breakfast., Disp: 7 tablet, Rfl: 0 .  rosuvastatin (CRESTOR) 20 MG tablet, TAKE ONE TABLET BY MOUTH DAILY, Disp: 90 tablet, Rfl: 1 .  silver sulfADIAZINE (SILVADENE) 1 % cream, Apply 1 application topically 2 (two) times daily., Disp: 50 g, Rfl: 2 .  albuterol (PROVENTIL HFA;VENTOLIN HFA) 108 (90 Base) MCG/ACT inhaler, Inhale 2 puffs into the lungs every 6 (six) hours as needed for wheezing or shortness of breath. (Patient not taking: Reported on 03/01/2018), Disp: 1 Inhaler,  Rfl: 2 .  cyclobenzaprine (FLEXERIL) 10 MG tablet, Take 1 tablet (10 mg total) by mouth 3 (three) times daily as needed for muscle spasms. (Patient not taking: Reported on 03/01/2018), Disp: 30 tablet, Rfl: 0 .  glipiZIDE (GLUCOTROL XL) 2.5 MG 24 hr tablet, TAKE ONE TABLET BY MOUTH EVERY  MORNING WITH BREAKFAST (Patient not taking: Reported on 03/01/2018), Disp: 90 tablet, Rfl: 1 .  prochlorperazine (COMPAZINE) 10 MG tablet, Take 1 tablet (10 mg total) by mouth every 6 (six) hours as needed (Nausea or vomiting). (Patient not taking: Reported on 02/09/2018), Disp: 30 tablet, Rfl: 1 .  traMADol (ULTRAM) 50 MG tablet, Take 1 tablet (50 mg total) by mouth every 6 (six) hours as needed. (Patient not taking: Reported on 02/09/2018), Disp: 30 tablet, Rfl: 0  Physical exam:  Vitals:   03/01/18 0952 03/01/18 0955  BP: 113/78   Pulse: (!) 114   Resp: 20   Temp: (!) 100.4 F (38 C)   TempSrc: Tympanic   SpO2:  95%   Physical Exam  Constitutional: She is oriented to person, place, and time. She is cooperative. She has a sickly appearance.  Accompanied by husband and daughter  HENT:  Head: Atraumatic.  Nose: Nose normal.  Mouth/Throat: Oropharynx is clear and moist. No oropharyngeal exudate.  Eyes: Conjunctivae are normal. No scleral icterus.  Neck: Normal range of motion.  Cardiovascular: Normal rate, regular rhythm and normal heart sounds.  Pulmonary/Chest: Effort normal. No stridor. No respiratory distress. She has no wheezes. She has rales (RLL).  Abdominal: Soft. Bowel sounds are normal. She exhibits no distension. There is no tenderness. There is no CVA tenderness.  Musculoskeletal: She exhibits no edema.  Neurological: She is oriented to person, place, and time.  Skin: Skin is warm and dry. There is erythema (mild redness right supraclavicular fossa, skin intact).  Alopecia; port accessed  Psychiatric: She has a normal mood and affect.     CMP Latest Ref Rng & Units 03/01/2018  Glucose 70 -  99 mg/dL 321(H)  BUN 8 - 23 mg/dL 24(H)  Creatinine 0.44 - 1.00 mg/dL 1.13(H)  Sodium 135 - 145 mmol/L 131(L)  Potassium 3.5 - 5.1 mmol/L 3.8  Chloride 98 - 111 mmol/L 96(L)  CO2 22 - 32 mmol/L 22  Calcium 8.9 - 10.3 mg/dL 9.2  Total Protein 6.5 - 8.1 g/dL 6.7  Total Bilirubin 0.3 - 1.2 mg/dL 0.5  Alkaline Phos 38 - 126 U/L 87  AST 15 - 41 U/L 19  ALT 0 - 44 U/L 17   CBC Latest Ref Rng & Units 03/01/2018  WBC 3.6 - 11.0 K/uL 9.6  Hemoglobin 12.0 - 16.0 g/dL 11.5(L)  Hematocrit 35.0 - 47.0 % 33.3(L)  Platelets 150 - 440 K/uL 177    No images are attached to the encounter.  No results found.  Assessment and plan- Patient is a 67 y.o. female with lung cancer who [facility] for complaints of cough & fever.   1. NSCLC- stage IIIB, currently s/p 5 cycles of concurrent carbo-taxol + radiation.  Plan for split course of radiation with restarting concurrent chemoradiation late this week/beginning of next with Dr. Baruch Gouty and Dr. Tasia Catchings.  Currently on treatment break.  CT for acute symptoms (see below) reviewed by Dr. Tasia Catchings and myself which shows metastatic lymphadenopathy has improved, however, new soft tissue nodule along the right diaphragm concerning for metastatic disease.  Dr. Tasia Catchings requests CT abdomen pelvis for further evaluation/treatment planning and will discuss with patient at next visit/while hospitalized (see below).   2. Pneumonia- presented with low grade fever and worsening cough. New right lower lobe rales. Labs independently reviewed: WBC 9.6, ANC 8.7. Questioning infectious vs inflammatory/reactionary to radiation.  Underlying lung cancer and emphysema.  Tylenol in clinic for low-grade fever. CT chest to further  evaluate which was independently reviewed and revealed increased opacity in right lower lobe consistent with pneumonia.  Curb 65 score of 2.  Received 1 dose IV Levaquin in clinic and morphine 2 mg IV with Zofran as premed.  Cough briefly improved then  persisted with new sputum/emesis w/ cough.   3. AKI & Hydronephrosis- creatinine acutely elevated from baseline. Cr 1.13, baseline 0.7-0.8. Labs and vitals reviewed (tachycardic), consistent with dehydration, likely d/t poor oral intake and fever. Some left flank pain and prior of kidney stone. UA in clinic negative for blood, infection. CT Chest showed dilated left intrarenal collecting system concerning for hydronephrosis but because not identified.  Per Dr. Tasia Catchings, recommendation to follow-up with CT abdomen pelvis.  IV fluids  in clinic.   4.  Diabetes mellitus-currently on metformin and glipizide. blood sugar in clinic today 321.  Glucose and ketones on UA.  Has not had medications today.  Recommend monitoring and sliding scale while inpatient.  Case discussed throughout the day with Dr. Tasia Catchings who independently reviewed labs and imaging and contributed to plan of care.  This patient continues to have significant discomfort from cough and curb 65 score, she recommends proceeding with hospital admission and requests oncology consultation.  She recommends CT abdomen pelvis to follow-up on hydronephrosis and left flank pain and complete evaluation given new nodule on diaphragm.  Additionally, she recommends IV fluids, IV antibiotics, and control of cough.  Patient received 1 dose of Levaquin in [facility].   Case discussed with Dr. Boyce Medici who accepts for admission.   Follow-up with Dr. Tasia Catchings at [facility] upon discharge.  Visit Diagnosis 1. Pneumonia of right lung due to infectious organism, unspecified part of lung   2. Malignant neoplasm of right lung, unspecified part of lung ([place])   3. Hydronephrosis of left kidney   4. Shortness of breath     Patient expressed understanding and was in agreement with this plan. She also understands that She can call clinic at any time with any questions, concerns, or complaints.   Thank you for allowing me to participate in the care of this very pleasant patient.   Beckey Rutter, DNP, AGNP-C [facility] [phone] (work cell) [phone] (office)

## 2018-03-01 NOTE — Progress Notes (Signed)
Family Meeting Note  Advance Directive:yes  Today a meeting took place with the Patient, spouse and daughter and son.   The following clinical team members were present during this meeting:MD  The following were discussed:Patient's diagnosis: lung cancer, pneumonia, Patient's progosis: Unable to determine and Goals for treatment: DNR  Additional follow-up to be provided: oncology  Time spent during discussion:20 minutes  Vaughan Basta, MD

## 2018-03-02 ENCOUNTER — Other Ambulatory Visit: Payer: Self-pay | Admitting: *Deleted

## 2018-03-02 ENCOUNTER — Ambulatory Visit: Admission: RE | Admit: 2018-03-02 | Payer: Medicare Other | Source: Ambulatory Visit

## 2018-03-02 ENCOUNTER — Inpatient Hospital Stay: Payer: Medicare Other

## 2018-03-02 ENCOUNTER — Ambulatory Visit: Payer: Medicare Other

## 2018-03-02 DIAGNOSIS — Z833 Family history of diabetes mellitus: Secondary | ICD-10-CM

## 2018-03-02 DIAGNOSIS — Z8249 Family history of ischemic heart disease and other diseases of the circulatory system: Secondary | ICD-10-CM

## 2018-03-02 DIAGNOSIS — C3490 Malignant neoplasm of unspecified part of unspecified bronchus or lung: Secondary | ICD-10-CM

## 2018-03-02 DIAGNOSIS — E119 Type 2 diabetes mellitus without complications: Secondary | ICD-10-CM

## 2018-03-02 DIAGNOSIS — I10 Essential (primary) hypertension: Secondary | ICD-10-CM

## 2018-03-02 DIAGNOSIS — R5081 Fever presenting with conditions classified elsewhere: Secondary | ICD-10-CM

## 2018-03-02 DIAGNOSIS — I2510 Atherosclerotic heart disease of native coronary artery without angina pectoris: Secondary | ICD-10-CM

## 2018-03-02 DIAGNOSIS — Z885 Allergy status to narcotic agent status: Secondary | ICD-10-CM

## 2018-03-02 DIAGNOSIS — J189 Pneumonia, unspecified organism: Secondary | ICD-10-CM

## 2018-03-02 DIAGNOSIS — E785 Hyperlipidemia, unspecified: Secondary | ICD-10-CM

## 2018-03-02 DIAGNOSIS — Z803 Family history of malignant neoplasm of breast: Secondary | ICD-10-CM

## 2018-03-02 DIAGNOSIS — N1330 Unspecified hydronephrosis: Secondary | ICD-10-CM

## 2018-03-02 DIAGNOSIS — Z888 Allergy status to other drugs, medicaments and biological substances status: Secondary | ICD-10-CM

## 2018-03-02 DIAGNOSIS — N179 Acute kidney failure, unspecified: Secondary | ICD-10-CM

## 2018-03-02 DIAGNOSIS — E871 Hypo-osmolality and hyponatremia: Secondary | ICD-10-CM

## 2018-03-02 DIAGNOSIS — Z880 Allergy status to penicillin: Secondary | ICD-10-CM

## 2018-03-02 LAB — BASIC METABOLIC PANEL
ANION GAP: 9 (ref 5–15)
BUN: 19 mg/dL (ref 8–23)
CHLORIDE: 100 mmol/L (ref 98–111)
CO2: 22 mmol/L (ref 22–32)
CREATININE: 1.38 mg/dL — AB (ref 0.44–1.00)
Calcium: 7.1 mg/dL — ABNORMAL LOW (ref 8.9–10.3)
GFR calc non Af Amer: 39 mL/min — ABNORMAL LOW (ref >60)
GFR, EST AFRICAN AMERICAN: 45 mL/min — AB (ref >60)
GLUCOSE: 285 mg/dL — AB (ref 70–99)
Potassium: 3.7 mmol/L (ref 3.5–5.1)
Sodium: 131 mmol/L — ABNORMAL LOW (ref 135–145)

## 2018-03-02 LAB — GLUCOSE, CAPILLARY
GLUCOSE-CAPILLARY: 213 mg/dL — AB (ref 70–99)
GLUCOSE-CAPILLARY: 206 mg/dL — AB (ref 70–99)
Glucose-Capillary: 220 mg/dL — ABNORMAL HIGH (ref 70–99)
Glucose-Capillary: 240 mg/dL — ABNORMAL HIGH (ref 70–99)

## 2018-03-02 LAB — CBC
HCT: 28.3 % — ABNORMAL LOW (ref 35.0–47.0)
HEMOGLOBIN: 9.5 g/dL — AB (ref 12.0–16.0)
MCH: 32.5 pg (ref 26.0–34.0)
MCHC: 33.6 g/dL (ref 32.0–36.0)
MCV: 96.9 fL (ref 80.0–100.0)
Platelets: 154 10*3/uL (ref 150–440)
RBC: 2.92 MIL/uL — AB (ref 3.80–5.20)
RDW: 19.7 % — ABNORMAL HIGH (ref 11.5–14.5)
WBC: 10.3 10*3/uL (ref 3.6–11.0)

## 2018-03-02 LAB — LACTIC ACID, PLASMA
LACTIC ACID, VENOUS: 2.7 mmol/L — AB (ref 0.5–1.9)
Lactic Acid, Venous: 1.4 mmol/L (ref 0.5–1.9)

## 2018-03-02 LAB — PROCALCITONIN: PROCALCITONIN: 13.04 ng/mL

## 2018-03-02 MED ORDER — MORPHINE SULFATE (PF) 2 MG/ML IV SOLN
2.0000 mg | INTRAVENOUS | Status: DC | PRN
  Filled 2018-03-02: qty 1

## 2018-03-02 MED ORDER — ENOXAPARIN SODIUM 40 MG/0.4ML ~~LOC~~ SOLN
40.0000 mg | SUBCUTANEOUS | Status: DC
  Administered 2018-03-02 – 2018-03-04 (×3): 40 mg via SUBCUTANEOUS
  Filled 2018-03-02 (×3): qty 0.4

## 2018-03-02 MED ORDER — SODIUM CHLORIDE 0.9 % IV BOLUS
500.0000 mL | Freq: Once | INTRAVENOUS | Status: AC
  Administered 2018-03-02: 500 mL via INTRAVENOUS

## 2018-03-02 MED ORDER — BENZONATATE 100 MG PO CAPS
200.0000 mg | ORAL_CAPSULE | Freq: Four times a day (QID) | ORAL | Status: DC | PRN
  Administered 2018-03-02 – 2018-03-10 (×13): 200 mg via ORAL
  Filled 2018-03-02 (×14): qty 2

## 2018-03-02 MED ORDER — SODIUM CHLORIDE 0.9 % IV BOLUS
1000.0000 mL | Freq: Once | INTRAVENOUS | Status: AC
  Administered 2018-03-02: 05:00:00 1000 mL via INTRAVENOUS

## 2018-03-02 MED ORDER — TRAMADOL HCL 50 MG PO TABS
50.0000 mg | ORAL_TABLET | Freq: Four times a day (QID) | ORAL | Status: DC | PRN
  Administered 2018-03-02 – 2018-03-06 (×10): 50 mg via ORAL
  Filled 2018-03-02 (×10): qty 1

## 2018-03-02 NOTE — Progress Notes (Signed)
Open in error

## 2018-03-02 NOTE — Progress Notes (Signed)
PALLIATIVE NOTE:  NO CHARGE    Received consult for goals of care discussion. I met with the family briefly today while patient was away for Ultrasound. Daughter, son, and husband at bedside. We discussed what Palliative Medicine team roll is while hospitalized and what Palliative care is. Family appreciative of service and support being offered.  I later came back and patient somewhat lethargic and unable to fully engage in conversation. Daughter verbalized she is exhausted and has had a rough few days. Support given. Family request to have updates from Dr. Tasia Catchings, Oncology today and would like to further have a discussion based on updates. Family also hopeful patient will be able to be more engaged in conversation as well.   I plan to meet with patient and family tomorrow 8/15 at 10am to further discuss goals of care. Detailed note and recommendations to follow.   Thank you for your referral!  Alda Lea, NP-BC Palliative Medicine Team  Phone: [phone] Fax: [phone] Pager: [phone] Amion: N. [place]

## 2018-03-02 NOTE — Progress Notes (Signed)
Inpatient Diabetes Program Recommendations  AACE/ADA: Consensus Statement on Inpatient Glycemic Control (2015)  Target Ranges:  Prepandial:   less than 140 mg/dL      Peak postprandial:   less than 180 mg/dL (1-2 hours)      Critically ill patients:  140 - 180 mg/dL    Results for TORRI, MICHALSKI (MRN [id number]) as of 03/02/2018 13:49  Ref. Range 03/01/2018 21:19 03/02/2018 07:26 03/02/2018 12:07  Glucose-Capillary Latest Ref Range: 70 - 99 mg/dL 354 (H) 220 (H) 240 (H)    Admit with: Pneumonia  History: DM, Lung Cancer (getting Chemo), HTN  Home DM Meds: Januvia 100 mg daily       Metformin 1000 mg daily       Glipizide 10 mg daily (per Home Med list, pt NOT taking)  Current Orders: Novolog Sensitive Correction Scale/ SSI (0-9 units) TID AC + HS      Metformin 1000 mg daily     Patient getting Prednisone 5 mg daily.  CBGs elevated since admission.     MD- Please consider the following in-hospital insulin adjustments:  1. Start Lantus 6 units QHS (0.1 units/kg dosing based on weight of 65 kg)  2. Check Current Hemoglobin A1c--Last one checked was 7.1% back in December 2018  3. Please change bedtime Novolog Correction scale (SSI) to the Bedtime scale per the Glycemic Control Order set (currently getting full dose Novolog scale at bedtime hour)     --Will follow patient during hospitalization--  Wyn Quaker RN, MSN, CDE Diabetes Coordinator Inpatient Glycemic Control Team Team Pager: [phone] (8a-5p)

## 2018-03-02 NOTE — Progress Notes (Addendum)
[facility] NAME: Dana Ramirez    MR#:  [id number]  DATE OF BIRTH:  1951/02/08  SUBJECTIVE:  Patient seen today Has shortness of breath Abdominal discomfort Low back pain  REVIEW OF SYSTEMS:    ROS  CONSTITUTIONAL: No documented fever.  Has fatigue, weakness. No weight gain, no weight loss.  EYES: No blurry or double vision.  ENT: No tinnitus. No postnasal drip. No redness of the oropharynx.  RESPIRATORY: No cough, no wheeze, no hemoptysis. No dyspnea.  CARDIOVASCULAR: No chest pain. No orthopnea. No palpitations. No syncope.  GASTROINTESTINAL: No nausea, no vomiting or diarrhea. No abdominal pain. No melena or hematochezia.  Mild abdominal discomfort GENITOURINARY: No dysuria or hematuria.  ENDOCRINE: No polyuria or nocturia. No heat or cold intolerance.  HEMATOLOGY: No anemia. No bruising. No bleeding.  INTEGUMENTARY: No rashes. No lesions.  MUSCULOSKELETAL: No arthritis. No swelling. No gout.  Low back pain NEUROLOGIC: No numbness, tingling, or ataxia. No seizure-type activity.  PSYCHIATRIC: No anxiety. No insomnia. No ADD.   DRUG ALLERGIES:   Allergies  Allergen Reactions  . Codeine Nausea And Vomiting  . Lisinopril Cough  . Penicillins Itching    Has patient had a PCN reaction causing immediate rash, facial/tongue/throat swelling, SOB or lightheadedness with hypotension: no Has patient had a PCN reaction causing severe rash involving mucus membranes or skin necrosis: no Has patient had a PCN reaction that required hospitalization: no Has patient had a PCN reaction occurring within the last 10 years: no If all of the above answers are "NO", then may proceed with Cephalosporin use.     VITALS:  Blood pressure 123/67, pulse (!) 122, temperature 98.2 F (36.8 C), temperature source Oral, resp. rate 20, height 5\' 3"  (1.6 m), weight 65.8 kg, SpO2 94 %.  PHYSICAL EXAMINATION:   Physical Exam  GENERAL:  67  y.o.-year-old patient lying in the bed on oxygen via nasal cannula EYES: Pupils equal, round, reactive to light and accommodation. No scleral icterus. Extraocular muscles intact.  HEENT: Head atraumatic, normocephalic. Oropharynx and nasopharynx clear.  NECK:  Supple, no jugular venous distention. No thyroid enlargement, no tenderness.  LUNGS: Decreased breath sounds bilaterally, rales in right lung. No use of accessory muscles of respiration.  CARDIOVASCULAR: S1, S2 normal. No murmurs, rubs, or gallops.  ABDOMEN: Soft, mild tenderness around umbilicus, nondistended. Bowel sounds present. No organomegaly or mass.  EXTREMITIES: No cyanosis, clubbing or edema b/l.    NEUROLOGIC: Cranial nerves II through XII are intact. No focal Motor or sensory deficits b/l.   PSYCHIATRIC: The patient is alert and oriented x 3.  SKIN: No obvious rash, lesion, or ulcer.   LABORATORY PANEL:   CBC Recent Labs  Lab 03/02/18 0436  WBC 10.3  HGB 9.5*  HCT 28.3*  PLT 154   ------------------------------------------------------------------------------------------------------------------ Chemistries  Recent Labs  Lab 03/01/18 0915 03/02/18 0436  NA 131* 131*  K 3.8 3.7  CL 96* 100  CO2 22 22  GLUCOSE 321* 285*  BUN 24* 19  CREATININE 1.13* 1.38*  CALCIUM 9.2 7.1*  AST 19  --   ALT 17  --   ALKPHOS 87  --   BILITOT 0.5  --    ------------------------------------------------------------------------------------------------------------------  Cardiac Enzymes No results for input(s): TROPONINI in the last 168 hours. ------------------------------------------------------------------------------------------------------------------  RADIOLOGY:  Ct Abdomen Pelvis Wo Contrast  Result Date: 03/01/2018 CLINICAL DATA:  History of non small cell lung carcinoma with fevers and nausea EXAM: CT ABDOMEN  AND PELVIS WITHOUT CONTRAST TECHNIQUE: Multidetector CT imaging of the abdomen and pelvis was performed  following the standard protocol without IV contrast. COMPARISON:  12/24/2017 PET-CT FINDINGS: Lower chest: Chronic changes are noted in the right lower lobe stable from the prior PET-CT is well as a CT examination from 12/29/2014. Some slight increased interstitial changes are noted which may be related to lymphangitic spread of carcinoma. The left lung base is within normal limits. Along the inferior aspect of the right hemidiaphragm posteriorly there is a 1.8 cm soft tissue nodule best seen on image number 21 of series 2. When compare with the prior PET-CT this was not present and likely represents a metastatic focus. Hepatobiliary: No focal liver abnormality is seen. Status post cholecystectomy. No biliary dilatation. Pancreas: Unremarkable. No pancreatic ductal dilatation or surrounding inflammatory changes. Spleen: Normal in size without focal abnormality. Adrenals/Urinary Tract: Adrenal glands are within normal limits. Bladder is decompressed. Kidneys are well visualized bilaterally. Some mild hydronephrotic changes are noted on the left which extend inferiorly into the mid ureter. No definitive stone is seen although some soft tissue thickening of the ureter is noted. Possibility of underlying mass lesion cannot be totally excluded. The bladder is partially distended. Stomach/Bowel: Scattered diverticular change of the colon is noted without evidence of diverticulitis. The appendix is within normal limits. No small bowel abnormality is seen. Vascular/Lymphatic: Aortic atherosclerosis. No enlarged abdominal or pelvic lymph nodes. Reproductive: Uterus and bilateral adnexa are unremarkable. Other: No abdominal wall hernia or abnormality. No abdominopelvic ascites. Musculoskeletal: Degenerative changes of the lumbar spine are seen. IMPRESSION: Changes in the right lung base predominately chronic in nature although some changes consistent with lymphangitic spread of carcinoma are present. These are new from the  prior PET-CT. Nodule along the right hemidiaphragm posteriorly likely representing a metastatic deposit. Obstructive changes of the left renal collecting system and proximal ureter. Some thickening of the ureter is seen which may represent a focal lesion. Clinical correlation is recommended. Although not mentioned in the body of the report there are 2 peritoneal soft tissue lesions identified. The largest of these lies on image number 49 of series 2 measuring approximately 18 mm in dimension. This may also represent some metastatic disease. Repeat PET-CT when the patient's condition improves may be helpful. Electronically Signed   By: Inez Catalina M.D.   On: 03/01/2018 21:12   Ct Chest Wo Contrast  Result Date: 03/01/2018 CLINICAL DATA:  Cough and shortness of breath beginning last night. The patient is undergoing chemotherapy and radiation therapy for lung carcinoma. EXAM: CT CHEST WITHOUT CONTRAST TECHNIQUE: Multidetector CT imaging of the chest was performed following the standard protocol without IV contrast. COMPARISON:  PET CT scan 12/24/2017. FINDINGS: Cardiovascular: The patient is status post CABG. Heart size is normal. Calcific aortic atherosclerosis is identified. No pericardial effusion. Mediastinum/Nodes: Previously seen 1.5 cm short axis dimension right supraclavicular node measures 0.8 cm on image 5 of series 2 today. Right suprahilar mass seen on the prior examination measures 4.4 x 2.7 cm today on image 32 compared to 4.9 x 3.6 cm on the prior study (remeasurement). The lesion abuts the right subclavian artery as on the prior exam. A right infrahilar mass lesion encasing the right mainstem bronchus measures 3.9 x 3.9 cm on image 67 compared to 4.3 x 4.3 cm on the prior exam. There is less mass effect on the right mainstem bronchus. The thyroid gland and esophagus are unremarkable. Lungs/Pleura: No pleural effusion. Thickened pleura and pleural calcification in  the right lung base are unchanged.  The lungs are emphysematous. Increased airspace opacity in the posterior right lower lobe since the prior exam is seen medially and worrisome for pneumonia. Scattered, patchy areas of ground-glass attenuation throughout the right lung are not grossly changed. The left lung is clear. Upper Abdomen: A soft tissue nodule measuring 1.8 x 1.6 cm on image 125 posterior to the medial limb of the right adrenal gland is new since the prior study. Also seen is new fullness of the visualized left intrarenal collecting system which is new since the prior exam. Musculoskeletal: No lytic or sclerotic lesion is identified. IMPRESSION: Some increase in airspace opacity in the right lower lobe worrisome for pneumonia. Lung carcinoma with metastatic lymphadenopathy appears improved as described above. Visualized left intrarenal collecting system is dilated worrisome for hydronephrosis, new since the prior examination. Cause for this finding is not identified. Renal ultrasound is recommended for further evaluation. New soft tissue nodule along the medial aspect of the posterior right hemidiaphragm is worrisome for metastatic disease. Aortic Atherosclerosis (ICD10-I70.0) and Emphysema (ICD10-J43.9). Electronically Signed   By: Inge Rise M.D.   On: 03/01/2018 12:37   US Renal  Result Date: 03/02/2018 CLINICAL DATA:  67 year old female with LEFT hydronephrosis. History of RIGHT lung cancer. EXAM: RENAL / URINARY TRACT ULTRASOUND COMPLETE COMPARISON:  03/01/2018 CT and prior studies FINDINGS: Right Kidney: Length: 10.7 cm. Echogenicity within normal limits. No mass or hydronephrosis visualized. Left Kidney: Length: 12 cm. Mild LEFT hydronephrosis appears decreased from recent CT. Echogenicity within normal limits. No solid mass visualized. Bladder: Appears normal for degree of bladder distention. IMPRESSION: 1. Mild LEFT hydronephrosis, which appears decreased from 03/01/2018 CT. 2. No other significant abnormalities.  Electronically Signed   By: Margarette Canada M.D.   On: 03/02/2018 10:46     ASSESSMENT AND PLAN:   67 year old female patient with history of non-small cell lung cancer, coronary artery disease, diabetes mellitus type 2, hyperlipidemia, hypertension under hospitalist service for shortness of breath and fever  -Healthcare associated pneumonia Continue IV cefepime antibiotic  MRSA pcr negative Discontinue IV vancomycin abx Follow-up cultures No new episodes of fever  -Sepsis secondary to pneumonia Broad-spectrum IV antibiotics Follow-up cultures and procalcitonin level  -Flank pain improved Renal ultrasound no stone  -Abdominal pain may be secondary to metastatic cancer Oncology follow-up  -Type 2 diabetes mellitus Sliding scale coverage with insulin and diabetic diet  -Metastatic lung cancer Palliative care medicine consult  All the records are reviewed and case discussed with Care Management/Social Worker. Management plans discussed with the patient, family and they are in agreement.  CODE STATUS: Full code  DVT Prophylaxis: SCDs  TOTAL TIME TAKING CARE OF THIS PATIENT: 35 minutes.   POSSIBLE D/C IN 2 to 3 DAYS, DEPENDING ON CLINICAL CONDITION.  Saundra Shelling M.D on 03/02/2018 at 1:45 PM  Between 7am to 6pm - Pager - [phone]  After 6pm go to [email or web address] - password [email or web address] [facility]  Office  [phone]  CC: Primary care physician; Crecencio Mc, MD  Note: This dictation was prepared with Dragon dictation along with smaller phrase technology. Any transcriptional errors that result from this process are unintentional.

## 2018-03-02 NOTE — Consult Note (Signed)
Urology Consult  I have been asked to see the patient by Dr. Estanislado Pandy, for evaluation and management of left hydronephrosis.  Chief Complaint: Left flank pain  History of Present Illness: Dana Ramirez is a 67 y.o. year old with stage III poorly differentiated non-small cell lung cancer currently being treated with chemoradiation with carboplatin and Taxol who was admitted with sepsis related to pneumonia with new findings of probable metastatic disease and new left hydronephrosis.  She reports that over the past 3 weeks, she is had intermittent left flank pain which was severe and wax and wane.  No precipitating or alleviating factors.  She had no associated dysuria, gross hematuria, fevers or chills.  She does have a personal history of kidney stones and thought it may be related to this.  She was admitted yesterday with fevers found to have possible pneumonia based on findings on the CT scan.  She is continued to have fevers up to 101.8 just now.  She initially had an elevated lactate which has since cleared.  She underwent CT abdomen pelvis without contrast yesterday which showed new mild left hydronephrosis to level of the mid ureter where there is some soft tissue thickening either in or adjacent to the ureter itself.  There is also new findings concerning for metastatic disease including a nodule under the right hemidiaphragm as well as 2 new foci up to 18 mm within the peritoneum on the left knee of the left kidney which also appear to be new metastatic foci.   Past Medical History:  Diagnosis Date  . CAD (coronary artery disease)   . Carotid arterial disease ([place])   . Diabetes mellitus without complication ([place])   . History of vertigo   . Hyperlipidemia   . Hypertension   . Kidney stone     Past Surgical History:  Procedure Laterality Date  . BREAST CYST ASPIRATION Left 1980s  . CESAREAN SECTION     x 2  . CHOLECYSTECTOMY    . CORONARY ARTERY BYPASS GRAFT   03/2005   LIMA to the LAD, vein graft to diagonal with a negative stress test in 06/2006  . PORTACATH PLACEMENT Left 12/31/2017   Procedure: INSERTION PORT-A-CATH;  Surgeon: Robert Bellow, MD;  Location: [facility] ORS;  Service: General;  Laterality: Left;  . SENTINEL NODE BIOPSY Right 12/31/2017   Procedure: SUPRACLAVICULAR NODE BIOPSY;  Surgeon: Robert Bellow, MD;  Location: [facility] ORS;  Service: General;  Laterality: Right;  . TONSILLECTOMY      Home Medications:  Current Meds  Medication Sig  . acetaminophen (TYLENOL) 650 MG CR tablet Take 650 mg by mouth every 8 (eight) hours as needed for pain.  Marland Kitchen aspirin (ASPIR-LOW) 81 MG EC tablet Take 81 mg by mouth 2 (two) times daily.   . Cholecalciferol (VITAMIN D3) 1000 units CAPS Take 1,000 Units by mouth daily.   . cyanocobalamin 1000 MCG tablet Take 1,000 mcg by mouth daily.  Marland Kitchen JANUVIA 100 MG tablet TAKE ONE TABLET BY MOUTH DAILY (Patient taking differently: Take 100 mg by mouth daily. )  . lidocaine-prilocaine (EMLA) cream Apply to affected area once (Patient taking differently: Apply 1 application topically once. Prior to treatment)  . LORazepam (ATIVAN) 0.5 MG tablet Take 1 tablet (0.5 mg total) by mouth every 8 (eight) hours as needed for anxiety (nausea).  . metFORMIN (GLUCOPHAGE) 1000 MG tablet TAKE ONE TABLET BY MOUTH DAILY (Patient taking differently: Take 1,000 mg by mouth every evening. )  .  metoprolol succinate (TOPROL-XL) 25 MG 24 hr tablet TAKE 1 TABLET DAILY (Patient taking differently: Take 25 mg by mouth daily. )  . naproxen sodium (ALEVE) 220 MG tablet Take 220 mg by mouth 2 (two) times daily as needed (pain).   Marland Kitchen omeprazole (PRILOSEC OTC) 20 MG tablet Take 20 mg by mouth daily.  . ondansetron (ZOFRAN) 8 MG tablet Take 1 tablet (8 mg total) by mouth 2 (two) times daily as needed for refractory nausea / vomiting. Start on day 3 after chemo.  . predniSONE (DELTASONE) 5 MG tablet Take 1 tablet (5 mg total) by mouth daily with  breakfast.  . rosuvastatin (CRESTOR) 20 MG tablet TAKE ONE TABLET BY MOUTH DAILY (Patient taking differently: Take 20 mg by mouth at bedtime. )    Allergies:  Allergies  Allergen Reactions  . Codeine Nausea And Vomiting  . Lisinopril Cough  . Penicillins Itching    Has patient had a PCN reaction causing immediate rash, facial/tongue/throat swelling, SOB or lightheadedness with hypotension: no Has patient had a PCN reaction causing severe rash involving mucus membranes or skin necrosis: no Has patient had a PCN reaction that required hospitalization: no Has patient had a PCN reaction occurring within the last 10 years: no If all of the above answers are "NO", then may proceed with Cephalosporin use.     Family History  Problem Relation Age of Onset  . Heart disease Mother   . Heart disease Father 22  . Stroke Sister 96  . Heart disease Sister   . Diabetes Brother   . Coronary artery disease Other        Strong family history of CAD and CABG  . Heart disease Maternal Grandmother   . Breast cancer Maternal Grandmother   . Heart disease Maternal Grandfather   . Heart disease Paternal Grandmother   . Breast cancer Paternal Grandmother 33  . Heart disease Paternal Grandfather     Social History:  reports that she has never smoked. She has never used smokeless tobacco. She reports that she does not drink alcohol or use drugs.  ROS: Review of systems is somewhat limited as the patient was lethargic and falling asleep often during our visit.  She did describe all of the above as well as nausea, weakness, fevers and shortness of breath.  All other 12 point review systems was negative.  Physical Exam:  Vital signs in last 24 hours: Temp:  [97.8 F (36.6 C)-98.7 F (37.1 C)] 98.2 F (36.8 C) (08/14 1312) Pulse Rate:  [111-131] 122 (08/14 1312) Resp:  [15-20] 20 (08/14 1312) BP: (80-123)/(43-88) 123/67 (08/14 1312) SpO2:  [89 %-95 %] 94 % (08/14 1312) Weight:  [65.8 kg] 65.8 kg  (08/13 1830) Constitutional:  Alert and oriented but somewhat lethargic and ill-appearing.  Husband at bedside. HEENT: [place] AT, moist mucus membranes.  Trachea midline, no masses Cardiovascular: Bilateral lower extremity edema appreciated. Respiratory: Mild respiratory distress, wearing O2. GI: Abdomen is soft, nontender, nondistended, no abdominal masses.   GU: No CVA tenderness Skin: No rashes, bruises or suspicious lesions.  Pale. Neurologic: Grossly intact, no focal deficits, moving all 4 extremities   Laboratory Data:  Recent Labs    03/01/18 0915 03/02/18 0436  WBC 9.6 10.3  HGB 11.5* 9.5*  HCT 33.3* 28.3*   Recent Labs    03/01/18 0915 03/02/18 0436  NA 131* 131*  K 3.8 3.7  CL 96* 100  CO2 22 22  GLUCOSE 321* 285*  BUN 24* 19  CREATININE 1.13* 1.38*  CALCIUM 9.2 7.1*   No results for input(s): LABPT, INR in the last 72 hours. No results for input(s): LABURIN in the last 72 hours. Results for orders placed or performed during the hospital encounter of 03/01/18  CULTURE, BLOOD (ROUTINE X 2) w Reflex to ID Panel     Status: None (Preliminary result)   Collection Time: 03/01/18  6:09 PM  Result Value Ref Range Status   Specimen Description BLOOD BLOOD RIGHT HAND  Final   Special Requests   Final    BOTTLES DRAWN AEROBIC AND ANAEROBIC Blood Culture adequate volume   Culture   Final    NO GROWTH < 24 HOURS Performed at [facility], [address], [place], [place] [address]    Report Status PENDING  Incomplete  CULTURE, BLOOD (ROUTINE X 2) w Reflex to ID Panel     Status: None (Preliminary result)   Collection Time: 03/01/18  6:51 PM  Result Value Ref Range Status   Specimen Description BLOOD BLOOD LEFT HAND  Final   Special Requests   Final    BOTTLES DRAWN AEROBIC AND ANAEROBIC Blood Culture results may not be optimal due to an inadequate volume of blood received in culture bottles   Culture   Final    NO GROWTH < 24 HOURS Performed at [facility], [address], [place], [place] [address]    Report Status PENDING  Incomplete  MRSA PCR Screening     Status: None   Collection Time: 03/01/18 10:11 PM  Result Value Ref Range Status   MRSA by PCR NEGATIVE NEGATIVE Final    Comment:        The GeneXpert MRSA Assay (FDA approved for NASAL specimens only), is one component of a comprehensive MRSA colonization surveillance program. It is not intended to diagnose MRSA infection nor to guide or monitor treatment for MRSA infections. Performed at [facility], [address] [place], [place] [address]    Component     Latest Ref Rng & Units 03/01/2018  Color, Urine     YELLOW YELLOW (A)  Appearance     CLEAR CLEAR (A)  Glucose, UA     NEGATIVE mg/dL >=500 (A)  Bilirubin Urine     NEGATIVE NEGATIVE  Ketones, ur     NEGATIVE mg/dL 5 (A)  Specific Gravity, Urine     1.005 - 1.030 1.024  Hgb urine dipstick     NEGATIVE NEGATIVE  pH     5.0 - 8.0 5.0  Protein     NEGATIVE mg/dL NEGATIVE  Nitrite     NEGATIVE NEGATIVE  Leukocytes, UA     NEGATIVE NEGATIVE  WBC, UA     0 - 5 WBC/hpf 0-5  Bacteria, UA     NONE SEEN NONE SEEN  Squamous Epithelial / LPF     0 - 5 0-5  Mucus      PRESENT    Radiologic Imaging: Ct Abdomen Pelvis Wo Contrast  Result Date: 03/01/2018 CLINICAL DATA:  History of non small cell lung carcinoma with fevers and nausea EXAM: CT ABDOMEN AND PELVIS WITHOUT CONTRAST TECHNIQUE: Multidetector CT imaging of the abdomen and pelvis was performed following the standard protocol without IV contrast. COMPARISON:  12/24/2017 PET-CT FINDINGS: Lower chest: Chronic changes are noted in the right lower lobe stable from the prior PET-CT is well as a CT examination from 12/29/2014. Some slight increased interstitial changes are noted which may be related to  lymphangitic spread of carcinoma. The left lung base is within normal limits. Along the inferior aspect of the right hemidiaphragm  posteriorly there is a 1.8 cm soft tissue nodule best seen on image number 21 of series 2. When compare with the prior PET-CT this was not present and likely represents a metastatic focus. Hepatobiliary: No focal liver abnormality is seen. Status post cholecystectomy. No biliary dilatation. Pancreas: Unremarkable. No pancreatic ductal dilatation or surrounding inflammatory changes. Spleen: Normal in size without focal abnormality. Adrenals/Urinary Tract: Adrenal glands are within normal limits. Bladder is decompressed. Kidneys are well visualized bilaterally. Some mild hydronephrotic changes are noted on the left which extend inferiorly into the mid ureter. No definitive stone is seen although some soft tissue thickening of the ureter is noted. Possibility of underlying mass lesion cannot be totally excluded. The bladder is partially distended. Stomach/Bowel: Scattered diverticular change of the colon is noted without evidence of diverticulitis. The appendix is within normal limits. No small bowel abnormality is seen. Vascular/Lymphatic: Aortic atherosclerosis. No enlarged abdominal or pelvic lymph nodes. Reproductive: Uterus and bilateral adnexa are unremarkable. Other: No abdominal wall hernia or abnormality. No abdominopelvic ascites. Musculoskeletal: Degenerative changes of the lumbar spine are seen. IMPRESSION: Changes in the right lung base predominately chronic in nature although some changes consistent with lymphangitic spread of carcinoma are present. These are new from the prior PET-CT. Nodule along the right hemidiaphragm posteriorly likely representing a metastatic deposit. Obstructive changes of the left renal collecting system and proximal ureter. Some thickening of the ureter is seen which may represent a focal lesion. Clinical correlation is recommended. Although not mentioned in the body of the report there are 2 peritoneal soft tissue lesions identified. The largest of these lies on image number 49  of series 2 measuring approximately 18 mm in dimension. This may also represent some metastatic disease. Repeat PET-CT when the patient's condition improves may be helpful. Electronically Signed   By: Inez Catalina M.D.   On: 03/01/2018 21:12   Ct Chest Wo Contrast  Result Date: 03/01/2018 CLINICAL DATA:  Cough and shortness of breath beginning last night. The patient is undergoing chemotherapy and radiation therapy for lung carcinoma. EXAM: CT CHEST WITHOUT CONTRAST TECHNIQUE: Multidetector CT imaging of the chest was performed following the standard protocol without IV contrast. COMPARISON:  PET CT scan 12/24/2017. FINDINGS: Cardiovascular: The patient is status post CABG. Heart size is normal. Calcific aortic atherosclerosis is identified. No pericardial effusion. Mediastinum/Nodes: Previously seen 1.5 cm short axis dimension right supraclavicular node measures 0.8 cm on image 5 of series 2 today. Right suprahilar mass seen on the prior examination measures 4.4 x 2.7 cm today on image 32 compared to 4.9 x 3.6 cm on the prior study (remeasurement). The lesion abuts the right subclavian artery as on the prior exam. A right infrahilar mass lesion encasing the right mainstem bronchus measures 3.9 x 3.9 cm on image 67 compared to 4.3 x 4.3 cm on the prior exam. There is less mass effect on the right mainstem bronchus. The thyroid gland and esophagus are unremarkable. Lungs/Pleura: No pleural effusion. Thickened pleura and pleural calcification in the right lung base are unchanged. The lungs are emphysematous. Increased airspace opacity in the posterior right lower lobe since the prior exam is seen medially and worrisome for pneumonia. Scattered, patchy areas of ground-glass attenuation throughout the right lung are not grossly changed. The left lung is clear. Upper Abdomen: A soft tissue nodule measuring 1.8 x 1.6 cm on image 125  posterior to the medial limb of the right adrenal gland is new since the prior study.  Also seen is new fullness of the visualized left intrarenal collecting system which is new since the prior exam. Musculoskeletal: No lytic or sclerotic lesion is identified. IMPRESSION: Some increase in airspace opacity in the right lower lobe worrisome for pneumonia. Lung carcinoma with metastatic lymphadenopathy appears improved as described above. Visualized left intrarenal collecting system is dilated worrisome for hydronephrosis, new since the prior examination. Cause for this finding is not identified. Renal ultrasound is recommended for further evaluation. New soft tissue nodule along the medial aspect of the posterior right hemidiaphragm is worrisome for metastatic disease. Aortic Atherosclerosis (ICD10-I70.0) and Emphysema (ICD10-J43.9). Electronically Signed   By: Inge Rise M.D.   On: 03/01/2018 12:37   US Renal  Result Date: 03/02/2018 CLINICAL DATA:  67 year old female with LEFT hydronephrosis. History of RIGHT lung cancer. EXAM: RENAL / URINARY TRACT ULTRASOUND COMPLETE COMPARISON:  03/01/2018 CT and prior studies FINDINGS: Right Kidney: Length: 10.7 cm. Echogenicity within normal limits. No mass or hydronephrosis visualized. Left Kidney: Length: 12 cm. Mild LEFT hydronephrosis appears decreased from recent CT. Echogenicity within normal limits. No solid mass visualized. Bladder: Appears normal for degree of bladder distention. IMPRESSION: 1. Mild LEFT hydronephrosis, which appears decreased from 03/01/2018 CT. 2. No other significant abnormalities. Electronically Signed   By: Margarette Canada M.D.   On: 03/02/2018 10:46    Impression/Assessment:  67 year old female now with what appears progression to stage IV metastatic non-small cell carcinoma of the lung admitted with pneumonia found to have new mild left hydronephrosis, presumably secondary to progression of her cancer.  Review of follow-up renal ultrasound today appears that the left hydronephrosis has decreased slightly but her  creatinine has continued to climb.    Given her completely negative urine, I believe is highly unlikely that this low-grade obstruction is the source of her fever.  Her white count is trending downwards and her lactate has cleared which is reassuring.  I had a lengthy discussion today both with the patient as well as her husband, although the patient was minimally participatory in the conversation as she was quite sleepy.  Whether or not to intervene surgically in terms of relieving the obstruction via stent or percutaneous nephrostomy tube should depend on goals of care, palliative care consult pending.    Case was also discussed with Dr. Tasia Catchings today who believes that the patient would do well with immunotherapy given high expression of PDL 1.  Plan:   1. Left hydronephrosis-mild, presumably related to either intrinsic or extrinsic compression of soft tissue mass to the level of transition point.  Whether or not to proceed with stent versus percutaneous nephrostomy tube will depend on the above.  I will make her n.p.o. at midnight as a precaution in case her creatinine continues to decline we can intervene as needed or deemed appropriate.  Agree with palliative care consult.  Will participate in tumor board discussion tomorrow as well.  2.  Acute kidney injury-renal function worsening although hydronephrosis improving slightly.  This is likely multifactorial secondary to obstruction but also administration of nephrotoxic agents including vancomycin as well as sepsis.  3.  Left flank pain-likely related to obstructive process.  Stent or percutaneous nephrostomy tube may help alleviate this pain, however, could possibly substitute left flank pain for pain at the nephrostomy tube site or irritative voiding symptoms/stent pain.  This was discussed with the patient and her husband as well.  I  will continue to follow this patient closely.  I will make her n.p.o. midnight as a precaution as stated above.   Plan follow-up serum creatinine in the a.m. to assess for improvement.  03/02/2018, 4:54 PM  Hollice Espy,  MD

## 2018-03-02 NOTE — Consult Note (Signed)
Hematology/Oncology Consult note [facility] Telephone:([phone] [phone]  Patient Care Team: Crecencio Mc, MD as PCP - General (Internal Medicine) Rockey Situ, Kathlene November, MD (Cardiology) Telford Nab, RN as Registered Nurse Bary Castilla, Forest Gleason, MD (General Surgery) Earlie Server, MD as Medical Oncologist (Medical Oncology)   Name of the patient: Dana Ramirez  [id number]  10/06/1950   Date of visit: 03/02/18 REASON FOR COSULTATION:  Lung cancer History of presenting illness-  67 y.o. female with PMH listed at below who is known to cancer center for treatment of high-grade non-small cell lung cancer, who is on concurrent chemoradiation who presents with fever, cough, shortness of breath, nausea and was seen by mid-level providers for symptom managements.  CT chest was obtained which showed possible pneumonia.  Patient failed outpatient treatment and was admitted for IV antibiotics. Initial lab work-up also showed acute kidney failure.    Data reviewed  #CT chest without contrast 03/01/2018 showed increase in the air space opacity in the right lower lobe worrisome for pneumonia. Lung carcinoma with metastatic lymphadenopathy appears improved.  However there is a new soft nodule along the medial aspect of the posterior right hemidiaphragm worrisome for metastatic disease.  Visualized the left intrarenal collecting system is dilated worrisome for hydronephrosis.  New since prior exam.  CT abdomen pelvis wo contrast was subsequently obtained due to the CT chest findings.  In addition to the information mentioned in chest CT report, there is obstructive changes of left renal collecting system and proximal ureter.  Some thickening of ureter is seen which may represent focal lesion.  Clinical correlation is recommended.  Also there are 2 paratonia soft nodule lesion identified.  The largest of these lies on image #49 of series 2 measuring about 1.8 cm in dimension.   This may also represents metastatic disease. Changes in the right lung base predominantly chronic in nature although some changes consistent with lymphangitic spread of carcinoma are present.  #Patient was seen at bedside.  She appears lethargic, not able to remain awake throughout conversation.  Per daughter who is at bedside, patient had a rough night.  She was restless, not able to get any sleep last night due to cough and pain. Patient is on IV antibiotics with cephapirin and vancomycin.   Pain regimen includes morphine 2 mg every 4 hours as needed for severe pain, tramadol 50 mg every 6 hours PRN.  Daughter reports that tramadol appears to work better for patient.  Review of Systems  Unable to perform ROS: Mental status change  Constitutional: Positive for fever and malaise/fatigue. Negative for chills and weight loss.  Eyes: Negative for double vision and photophobia.  Respiratory: Positive for cough and shortness of breath. Negative for wheezing.   Cardiovascular: Negative for chest pain.  Gastrointestinal: Positive for nausea. Negative for blood in stool and vomiting.  Genitourinary: Negative for dysuria.  Musculoskeletal: Negative for myalgias.    Allergies  Allergen Reactions  . Codeine Nausea And Vomiting  . Lisinopril Cough  . Penicillins Itching    Has patient had a PCN reaction causing immediate rash, facial/tongue/throat swelling, SOB or lightheadedness with hypotension: no Has patient had a PCN reaction causing severe rash involving mucus membranes or skin necrosis: no Has patient had a PCN reaction that required hospitalization: no Has patient had a PCN reaction occurring within the last 10 years: no If all of the above answers are "NO", then may proceed with Cephalosporin use.     Patient Active Problem  List   Diagnosis Date Noted  . Pneumonia 03/01/2018  . Supraclavicular lymphadenopathy 01/26/2018  . Goals of care, counseling/discussion 01/07/2018  . Lung  cancer ([place]) 01/06/2018  . Malignant neoplasm of overlapping sites of right lung ([place]) 12/28/2017  . Wheezing 12/21/2017  . Mediastinal lymphadenopathy 12/21/2017  . Neck mass 12/08/2017  . Cervical radiculopathy at C8 11/06/2017  . GERD (gastroesophageal reflux disease) 04/10/2017  . 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 B12 deficiency 04/02/2016  . Cough in adult patient 12/28/2015  . Carpal tunnel syndrome 06/03/2015  . Atherosclerosis of coronary artery 06/03/2015  . Hypertension 06/03/2015  . Subclinical hypothyroidism 06/03/2015  . Diabetes mellitus without complication ([place]) 77/82/4235  . Carotid stenosis 12/21/2012  . Hyperlipidemia 12/19/2009  . Atherosclerosis of native coronary artery of native heart with stable angina pectoris ([place]) 12/19/2009     Past Medical History:  Diagnosis Date  . CAD (coronary artery disease)   . Carotid arterial disease ([place])   . Diabetes mellitus without complication ([place])   . History of vertigo   . Hyperlipidemia   . Hypertension   . Kidney stone      Past Surgical History:  Procedure Laterality Date  . BREAST CYST ASPIRATION Left 1980s  . CESAREAN SECTION     x 2  . CHOLECYSTECTOMY    . CORONARY ARTERY BYPASS GRAFT  03/2005   LIMA to the LAD, vein graft to diagonal with a negative stress test in 06/2006  . PORTACATH PLACEMENT Left 12/31/2017   Procedure: INSERTION PORT-A-CATH;  Surgeon: Robert Bellow, MD;  Location: [facility] ORS;  Service: General;  Laterality: Left;  . SENTINEL NODE BIOPSY Right 12/31/2017   Procedure: SUPRACLAVICULAR NODE BIOPSY;  Surgeon: Robert Bellow, MD;  Location: [facility] ORS;  Service: General;  Laterality: Right;  . TONSILLECTOMY      Social History   Socioeconomic History  . Marital status: Married    Spouse name: Elenore Rota   . Number of children: 2  . Years of education: Not on file  . Highest education level: Not on file  Occupational History  . Occupation: Retired     Fish farm manager: OTHER    Comment: [employer]    Social Needs  . Financial resource strain: Not on file  . Food insecurity:    Worry: Not on file    Inability: Not on file  . Transportation needs:    Medical: Not on file    Non-medical: Not on file  Tobacco Use  . Smoking status: Never Smoker  . Smokeless tobacco: Never Used  Substance and Sexual Activity  . Alcohol use: No  . Drug use: No  . Sexual activity: Not on file  Lifestyle  . Physical activity:    Days per week: Not on file    Minutes per session: Not on file  . Stress: Not on file  Relationships  . Social connections:    Talks on phone: Not on file    Gets together: Not on file    Attends religious service: Not on file    Active member of club or organization: Not on file    Attends meetings of clubs or organizations: Not on file    Relationship status: Not on file  . Intimate partner violence:    Fear of current or ex partner: Not on file    Emotionally abused: Not on file    Physically abused: Not on file    Forced sexual activity: Not on file  Other Topics Concern  .  Not on file  Social History Narrative   Married   Gets regular exercise, once or twice every week     Family History  Problem Relation Age of Onset  . Heart disease Mother   . Heart disease Father 36  . Stroke Sister 21  . Heart disease Sister   . Diabetes Brother   . Coronary artery disease Other        Strong family history of CAD and CABG  . Heart disease Maternal Grandmother   . Breast cancer Maternal Grandmother   . Heart disease Maternal Grandfather   . Heart disease Paternal Grandmother   . Breast cancer Paternal Grandmother 7  . Heart disease Paternal Grandfather      Current Facility-Administered Medications:  .  0.9 %  sodium chloride infusion, , Intravenous, Continuous, Harrie Foreman, MD, Last Rate: 150 mL/hr at 03/02/18 0622 .  acetaminophen (TYLENOL) tablet 650 mg, 650 mg, Oral, Q6H PRN, Vaughan Basta, MD, 650 mg at 03/02/18 1219 .  albuterol  (PROVENTIL) (2.5 MG/3ML) 0.083% nebulizer solution 3 mL, 3 mL, Inhalation, Q6H PRN, Vaughan Basta, MD .  aspirin EC tablet 81 mg, 81 mg, Oral, BID, Vaughan Basta, MD, 81 mg at 03/02/18 0826 .  benzonatate (TESSALON) capsule 200 mg, 200 mg, Oral, TID PRN, Lance Coon, MD, 200 mg at 03/02/18 0840 .  ceFEPIme (MAXIPIME) 2 g in sodium chloride 0.9 % 100 mL IVPB, 2 g, Intravenous, Q12H, Hallaji, Dani Gobble, RPH, Stopped at 03/02/18 0902 .  chlorpheniramine-HYDROcodone (TUSSIONEX) 10-8 MG/5ML suspension 5 mL, 5 mL, Oral, Q12H PRN, Vaughan Basta, MD, 5 mL at 03/02/18 1219 .  cholecalciferol (VITAMIN D) tablet 1,000 Units, 1,000 Units, Oral, Daily, Vaughan Basta, MD, 1,000 Units at 03/02/18 0826 .  docusate sodium (COLACE) capsule 100 mg, 100 mg, Oral, BID PRN, Vaughan Basta, MD .  docusate sodium (COLACE) capsule 100 mg, 100 mg, Oral, BID, Vaughan Basta, MD, 100 mg at 03/02/18 0826 .  heparin injection 5,000 Units, 5,000 Units, Subcutaneous, Q8H, Vaughan Basta, MD, 5,000 Units at 03/02/18 0558 .  insulin aspart (novoLOG) injection 0-9 Units, 0-9 Units, Subcutaneous, TID AC & HS, Lance Coon, MD, 3 Units at 03/02/18 1218 .  LORazepam (ATIVAN) tablet 0.5 mg, 0.5 mg, Oral, Q8H PRN, Vaughan Basta, MD, 0.5 mg at 03/01/18 2021 .  metFORMIN (GLUCOPHAGE) tablet 1,000 mg, 1,000 mg, Oral, Daily, Vaughan Basta, MD, 1,000 mg at 03/02/18 0826 .  metoprolol succinate (TOPROL-XL) 24 hr tablet 25 mg, 25 mg, Oral, Daily, Vaughan Basta, MD .  morphine 2 MG/ML injection 2 mg, 2 mg, Intravenous, Q4H PRN, Pyreddy, Pavan, MD .  ondansetron (ZOFRAN) injection 4 mg, 4 mg, Intravenous, Q6H PRN, Vaughan Basta, MD, 4 mg at 03/02/18 1219 .  pantoprazole (PROTONIX) EC tablet 40 mg, 40 mg, Oral, Daily, Vaughan Basta, MD, 40 mg at 03/02/18 0826 .  predniSONE (DELTASONE) tablet 5 mg, 5 mg, Oral, Q breakfast, Vaughan Basta, MD, 5 mg at 03/02/18 0826 .  rosuvastatin (CRESTOR) tablet 20 mg, 20 mg, Oral, QPM, Vaughan Basta, MD .  traMADol (ULTRAM) tablet 50 mg, 50 mg, Oral, Q6H PRN, Pyreddy, Pavan, MD, 50 mg at 03/02/18 0839 .  vitamin B-12 (CYANOCOBALAMIN) tablet 1,000 mcg, 1,000 mcg, Oral, Daily, Vaughan Basta, MD, 1,000 mcg at 03/02/18 0826   Physical exam: ECOG  Vitals:   03/02/18 0615 03/02/18 0644 03/02/18 1140 03/02/18 1312  BP: (!) 82/55 (!) 104/44 101/88 123/67  Pulse: (!) 111 (!) 123 (!) 121 (!) 122  Resp:  18 20  Temp:   98.7 F (37.1 C) 98.2 F (36.8 C)  TempSrc:   Oral Oral  SpO2:   92% 94%  Weight:      Height:       Physical Exam  Constitutional:  Lethargic  HENT:  Head: Normocephalic and atraumatic.  Mouth/Throat: Oropharynx is clear and moist.  Eyes: Pupils are equal, round, and reactive to light. No scleral icterus.  Neck: Normal range of motion. Neck supple.  Cardiovascular: Normal rate and regular rhythm.  No murmur heard. Pulmonary/Chest: Effort normal. She has rales.  Abdominal: Soft.  Musculoskeletal: Normal range of motion.  Skin: Skin is warm.        CMP Latest Ref Rng & Units 03/02/2018  Glucose 70 - 99 mg/dL 285(H)  BUN 8 - 23 mg/dL 19  Creatinine 0.44 - 1.00 mg/dL 1.38(H)  Sodium 135 - 145 mmol/L 131(L)  Potassium 3.5 - 5.1 mmol/L 3.7  Chloride 98 - 111 mmol/L 100  CO2 22 - 32 mmol/L 22  Calcium 8.9 - 10.3 mg/dL 7.1(L)  Total Protein 6.5 - 8.1 g/dL -  Total Bilirubin 0.3 - 1.2 mg/dL -  Alkaline Phos 38 - 126 U/L -  AST 15 - 41 U/L -  ALT 0 - 44 U/L -   CBC Latest Ref Rng & Units 03/02/2018  WBC 3.6 - 11.0 K/uL 10.3  Hemoglobin 12.0 - 16.0 g/dL 9.5(L)  Hematocrit 35.0 - 47.0 % 28.3(L)  Platelets 150 - 440 K/uL 154   RADIOGRAPHIC STUDIES: I have personally reviewed the radiological images as listed and agreed with the findings in the report.  Ct Abdomen Pelvis Wo Contrast  Result Date: 03/01/2018 CLINICAL DATA:   History of non small cell lung carcinoma with fevers and nausea EXAM: CT ABDOMEN AND PELVIS WITHOUT CONTRAST TECHNIQUE: Multidetector CT imaging of the abdomen and pelvis was performed following the standard protocol without IV contrast. COMPARISON:  12/24/2017 PET-CT FINDINGS: Lower chest: Chronic changes are noted in the right lower lobe stable from the prior PET-CT is well as a CT examination from 12/29/2014. Some slight increased interstitial changes are noted which may be related to lymphangitic spread of carcinoma. The left lung base is within normal limits. Along the inferior aspect of the right hemidiaphragm posteriorly there is a 1.8 cm soft tissue nodule best seen on image number 21 of series 2. When compare with the prior PET-CT this was not present and likely represents a metastatic focus. Hepatobiliary: No focal liver abnormality is seen. Status post cholecystectomy. No biliary dilatation. Pancreas: Unremarkable. No pancreatic ductal dilatation or surrounding inflammatory changes. Spleen: Normal in size without focal abnormality. Adrenals/Urinary Tract: Adrenal glands are within normal limits. Bladder is decompressed. Kidneys are well visualized bilaterally. Some mild hydronephrotic changes are noted on the left which extend inferiorly into the mid ureter. No definitive stone is seen although some soft tissue thickening of the ureter is noted. Possibility of underlying mass lesion cannot be totally excluded. The bladder is partially distended. Stomach/Bowel: Scattered diverticular change of the colon is noted without evidence of diverticulitis. The appendix is within normal limits. No small bowel abnormality is seen. Vascular/Lymphatic: Aortic atherosclerosis. No enlarged abdominal or pelvic lymph nodes. Reproductive: Uterus and bilateral adnexa are unremarkable. Other: No abdominal wall hernia or abnormality. No abdominopelvic ascites. Musculoskeletal: Degenerative changes of the lumbar spine are seen.  IMPRESSION: Changes in the right lung base predominately chronic in nature although some changes consistent with lymphangitic spread of carcinoma are present. These are  new from the prior PET-CT. Nodule along the right hemidiaphragm posteriorly likely representing a metastatic deposit. Obstructive changes of the left renal collecting system and proximal ureter. Some thickening of the ureter is seen which may represent a focal lesion. Clinical correlation is recommended. Although not mentioned in the body of the report there are 2 peritoneal soft tissue lesions identified. The largest of these lies on image number 49 of series 2 measuring approximately 18 mm in dimension. This may also represent some metastatic disease. Repeat PET-CT when the patient's condition improves may be helpful. Electronically Signed   By: Inez Catalina M.D.   On: 03/01/2018 21:12   Ct Chest Wo Contrast  Result Date: 03/01/2018 CLINICAL DATA:  Cough and shortness of breath beginning last night. The patient is undergoing chemotherapy and radiation therapy for lung carcinoma. EXAM: CT CHEST WITHOUT CONTRAST TECHNIQUE: Multidetector CT imaging of the chest was performed following the standard protocol without IV contrast. COMPARISON:  PET CT scan 12/24/2017. FINDINGS: Cardiovascular: The patient is status post CABG. Heart size is normal. Calcific aortic atherosclerosis is identified. No pericardial effusion. Mediastinum/Nodes: Previously seen 1.5 cm short axis dimension right supraclavicular node measures 0.8 cm on image 5 of series 2 today. Right suprahilar mass seen on the prior examination measures 4.4 x 2.7 cm today on image 32 compared to 4.9 x 3.6 cm on the prior study (remeasurement). The lesion abuts the right subclavian artery as on the prior exam. A right infrahilar mass lesion encasing the right mainstem bronchus measures 3.9 x 3.9 cm on image 67 compared to 4.3 x 4.3 cm on the prior exam. There is less mass effect on the right  mainstem bronchus. The thyroid gland and esophagus are unremarkable. Lungs/Pleura: No pleural effusion. Thickened pleura and pleural calcification in the right lung base are unchanged. The lungs are emphysematous. Increased airspace opacity in the posterior right lower lobe since the prior exam is seen medially and worrisome for pneumonia. Scattered, patchy areas of ground-glass attenuation throughout the right lung are not grossly changed. The left lung is clear. Upper Abdomen: A soft tissue nodule measuring 1.8 x 1.6 cm on image 125 posterior to the medial limb of the right adrenal gland is new since the prior study. Also seen is new fullness of the visualized left intrarenal collecting system which is new since the prior exam. Musculoskeletal: No lytic or sclerotic lesion is identified. IMPRESSION: Some increase in airspace opacity in the right lower lobe worrisome for pneumonia. Lung carcinoma with metastatic lymphadenopathy appears improved as described above. Visualized left intrarenal collecting system is dilated worrisome for hydronephrosis, new since the prior examination. Cause for this finding is not identified. Renal ultrasound is recommended for further evaluation. New soft tissue nodule along the medial aspect of the posterior right hemidiaphragm is worrisome for metastatic disease. Aortic Atherosclerosis (ICD10-I70.0) and Emphysema (ICD10-J43.9). Electronically Signed   By: Inge Rise M.D.   On: 03/01/2018 12:37   US Renal  Result Date: 03/02/2018 CLINICAL DATA:  67 year old female with LEFT hydronephrosis. History of RIGHT lung cancer. EXAM: RENAL / URINARY TRACT ULTRASOUND COMPLETE COMPARISON:  03/01/2018 CT and prior studies FINDINGS: Right Kidney: Length: 10.7 cm. Echogenicity within normal limits. No mass or hydronephrosis visualized. Left Kidney: Length: 12 cm. Mild LEFT hydronephrosis appears decreased from recent CT. Echogenicity within normal limits. No solid mass visualized.  Bladder: Appears normal for degree of bladder distention. IMPRESSION: 1. Mild LEFT hydronephrosis, which appears decreased from 03/01/2018 CT. 2. No other significant abnormalities. Electronically  Signed   By: Margarette Canada M.D.   On: 03/02/2018 10:46    Assessment and plan- Patient is a 66 y.o. female with history of CAD, diabetes, hyperlipidemia, hypertension kidney stone recent diagnosis of high-grade non-small cell lung cancer on chemo plus radiation, presents with cough, fever, acute kidney failure.  #Healthcare associated pneumonia, immunocompromised cancer patient on active chemotherapy/radiation, agree with treating with vancomycin and cephapirin.  Procalcitonin elevated.  Blood culture pending.  #CT scans and ultrasound kidney were independently reviewed and discussed with patient's daughter and husband with patient's permission. Patient currently quite drowsy and lethargic due to lack of sleeping last night not able to participate in discussion. Unfortunately looks like she has developed distant metastasis while on chemotherapy and radiation.  High likelihood of disease progression discussed with patient's husband and the daughter. Recommend starting systemic immunotherapy plus minus chemotherapy regimen outpatient once she recovers from pneumonia. Outpatient PET scan as well. I will present her case on tumor board tomorrow and update patient and family members.  #AKI, secondary to obstructive changes in the left renal collecting system proximal ureter.  Questionable metastatic cancer?  Kidney function further worsening. Recommend consulting urology for evaluation of Stent or percutaneous nephrostomy tubes.   # Hyponatremia, due to poor oral Intake vs SIADH.    Thank you for allowing me to participate in the care of this patient.  Total face to face encounter time for this patient visit was 75 min. >50% of the time was  spent in counseling and coordination of care.    Earlie Server, MD,  PhD Hematology Oncology [facility] Pager- [phone] 03/02/2018

## 2018-03-03 ENCOUNTER — Other Ambulatory Visit: Payer: Self-pay | Admitting: Oncology

## 2018-03-03 ENCOUNTER — Encounter: Payer: Self-pay | Admitting: Oncology

## 2018-03-03 ENCOUNTER — Ambulatory Visit: Payer: Medicare Other

## 2018-03-03 DIAGNOSIS — Z66 Do not resuscitate: Secondary | ICD-10-CM

## 2018-03-03 DIAGNOSIS — Z7189 Other specified counseling: Secondary | ICD-10-CM

## 2018-03-03 DIAGNOSIS — Z515 Encounter for palliative care: Secondary | ICD-10-CM

## 2018-03-03 DIAGNOSIS — C3491 Malignant neoplasm of unspecified part of right bronchus or lung: Secondary | ICD-10-CM

## 2018-03-03 DIAGNOSIS — R531 Weakness: Secondary | ICD-10-CM

## 2018-03-03 DIAGNOSIS — R590 Localized enlarged lymph nodes: Secondary | ICD-10-CM

## 2018-03-03 LAB — HIV ANTIBODY (ROUTINE TESTING W REFLEX): HIV SCREEN 4TH GENERATION: NONREACTIVE

## 2018-03-03 LAB — BASIC METABOLIC PANEL
ANION GAP: 7 (ref 5–15)
BUN: 18 mg/dL (ref 8–23)
CO2: 20 mmol/L — AB (ref 22–32)
Calcium: 6.8 mg/dL — ABNORMAL LOW (ref 8.9–10.3)
Chloride: 107 mmol/L (ref 98–111)
Creatinine, Ser: 1.21 mg/dL — ABNORMAL HIGH (ref 0.44–1.00)
GFR, EST AFRICAN AMERICAN: 53 mL/min — AB (ref >60)
GFR, EST NON AFRICAN AMERICAN: 46 mL/min — AB (ref >60)
GLUCOSE: 176 mg/dL — AB (ref 70–99)
POTASSIUM: 3.9 mmol/L (ref 3.5–5.1)
Sodium: 134 mmol/L — ABNORMAL LOW (ref 135–145)

## 2018-03-03 LAB — GLUCOSE, CAPILLARY
GLUCOSE-CAPILLARY: 236 mg/dL — AB (ref 70–99)
GLUCOSE-CAPILLARY: 245 mg/dL — AB (ref 70–99)
GLUCOSE-CAPILLARY: 272 mg/dL — AB (ref 70–99)
GLUCOSE-CAPILLARY: 239 mg/dL — AB (ref 70–99)
Glucose-Capillary: 160 mg/dL — ABNORMAL HIGH (ref 70–99)

## 2018-03-03 LAB — CBC
HEMATOCRIT: 24.4 % — AB (ref 35.0–47.0)
Hemoglobin: 8.4 g/dL — ABNORMAL LOW (ref 12.0–16.0)
MCH: 33 pg (ref 26.0–34.0)
MCHC: 34.4 g/dL (ref 32.0–36.0)
MCV: 96 fL (ref 80.0–100.0)
PLATELETS: 143 10*3/uL — AB (ref 150–440)
RBC: 2.54 MIL/uL — AB (ref 3.80–5.20)
RDW: 19.6 % — ABNORMAL HIGH (ref 11.5–14.5)
WBC: 9.6 10*3/uL (ref 3.6–11.0)

## 2018-03-03 LAB — PROCALCITONIN: Procalcitonin: 13.08 ng/mL

## 2018-03-03 MED ORDER — KETOROLAC TROMETHAMINE 15 MG/ML IJ SOLN
15.0000 mg | Freq: Four times a day (QID) | INTRAMUSCULAR | Status: DC | PRN
  Administered 2018-03-06 – 2018-03-07 (×3): 15 mg via INTRAVENOUS
  Filled 2018-03-03 (×4): qty 1

## 2018-03-03 MED ORDER — IPRATROPIUM-ALBUTEROL 0.5-2.5 (3) MG/3ML IN SOLN: 3.0000 mL | Freq: Once | RESPIRATORY_TRACT | Status: DC

## 2018-03-03 NOTE — Progress Notes (Signed)
OT Cancellation Note  Patient Details Name: Dana Ramirez MRN: [id number] DOB: [birth year]   Cancelled Treatment:    Reason Eval/Treat Not Completed: Patient declined, no reason specified. Order received, chart reviewed. Upon initial attempt to evaluate, pt preparing to eat applesauce with daughter. Pt/dtr requesting OT come back later. Will re-attempt OT evaluation at later date/time as pt is available, agreeable, and medically appropriate.   Jeni Salles, MPH, MS, OTR/L ascom [phone] 03/03/18, 2:06 PM

## 2018-03-03 NOTE — Care Management (Signed)
RNCM attempted to discuss transition of care however they would like to include daughter Margreta Journey and she will be available in morning around 0800. Husband expects patient will need a walker, toilet and shower chair at discharge. PCP Dr. Derrel Nip.

## 2018-03-03 NOTE — Evaluation (Signed)
Physical Therapy Evaluation Patient Details Name: Dana Ramirez MRN: [id number] DOB: [birth year] Today's Date: 03/03/2018   History of Present Illness  Pt presents to hospital on 03/01/18 for complaints of weakness and SOB. Pt subsequently diagnosed with pneumonia, AKI, and hydonephrosis. Pt is currently undergoing chemotherapy for non small cell lung cancer. Pts other PMH includes CAD, DM, vertigo, HLD, and kidney stones    Clinical Impression  Pt is a pleasant 67 year old female who was admitted for pneumonia, AKI, and hydonephrosis. Pt performs bed mobility, transfers, and ambulation with min assist. Pt demonstrates deficits with strength, mobility, activity tolerance and endurance. Pt is currently undergoing chemo for non small cell lung cancer, however has a high PLOF able to amb community distances without AD. Pt fatigues very easy throughout session which pt and family states is off baseline. Pt instructed in and tolerates there-ex well however requires multiple rest breaks. Pt amb 3' with CGA and hand held assistance to chair. Pts vitals monitored throughout session, pts O2 saturation remained above 93% throughout and HR ranged from 121 at rest to 130 with amb. Pt complained of 2/10 shld pain with activity however was otherwise pain free. Pt declines nausea or dizziness throughout session. Pt could benefit from use of RW with further distances for amb. Pt at this time is limited mostly due to fatigue and poor activity tolerance. Pt could benefit from continued skilled therapy at this time to improve deficits toward PLOF. PT will continue to work with pt at least 2x/week while admitted. D/c recommendations at this time are home with HHPT and 24/7 supervision.      Follow Up Recommendations Home health PT;Supervision/Assistance - 24 hour    Equipment Recommendations  Rolling walker with 5" wheels    Recommendations for Other Services OT consult     Precautions / Restrictions  Precautions Precautions: None Restrictions Weight Bearing Restrictions: No      Mobility  Bed Mobility Overal bed mobility: Needs Assistance Bed Mobility: Supine to Sit     Supine to sit: Min assist     General bed mobility comments: Pt performs bed mobility with min assist mostly for trunk. Pt uses UEs appropriately however requires increased effort to perform  Transfers Overall transfer level: Needs assistance Equipment used: 1 person hand held assist Transfers: Sit to/from Stand Sit to Stand: Min assist         General transfer comment: Pt requires min assist to transfer sit to stand, pt is able to position self well on edge of bed and requires minimal verbal cuing for sequencing. Able to maintain standing with CGA and no LOB  Ambulation/Gait Ambulation/Gait assistance: Min guard Gait Distance (Feet): 3 Feet Assistive device: 1 person hand held assist Gait Pattern/deviations: Shuffle     General Gait Details: Pt amb 3' to chair beside bed with hand held assistance and CGA. Pt demonstrates slow shuffling gait however does not have any buckling or LOB.  Stairs            Wheelchair Mobility    Modified Rankin (Stroke Patients Only)       Balance Overall balance assessment: Needs assistance   Sitting balance-Leahy Scale: Good Sitting balance - Comments: Pt able to sit EOB without UE support and no LOB     Standing balance-Leahy Scale: Fair Standing balance comment: Pt requires hand held assistance in standing to maintain upright posture  Pertinent Vitals/Pain Pain Assessment: 0-10 Pain Score: 2 (0/10 with rest. 2/10 with activity.) Pain Location: R shld Pain Descriptors / Indicators: Aching Pain Intervention(s): Monitored during session;Repositioned    Home Living Family/patient expects to be discharged to:: Private residence Living Arrangements: Spouse/significant other Available Help at Discharge:  Family;Available 24 hours/day(Husband and daughters available 24/7) Type of Home: House Home Access: Stairs to enter Entrance Stairs-Rails: Left Entrance Stairs-Number of Steps: 4 Home Layout: One level Home Equipment: Cane - quad      Prior Function Level of Independence: Independent         Comments: Pt states that she was independent without an AD and was able to amb in the community as needed.     Hand Dominance        Extremity/Trunk Assessment   Upper Extremity Assessment Upper Extremity Assessment: Generalized weakness(Grossly 3+/5 equal bilateral)    Lower Extremity Assessment Lower Extremity Assessment: Generalized weakness(Grossly 4-/5, equal bilateral)       Communication   Communication: No difficulties  Cognition Arousal/Alertness: Awake/alert Behavior During Therapy: WFL for tasks assessed/performed Overall Cognitive Status: Within Functional Limits for tasks assessed                                        General Comments      Exercises Other Exercises Other Exercises: Pt instructed in and tolerates well B LE ther-ex including SLR, LAQ, seated and standing marching x10 reps each. Pt does fatigue with ther-ex and requires intermittent rest breaks   Assessment/Plan    PT Assessment Patient needs continued PT services  PT Problem List Decreased strength;Decreased range of motion;Decreased activity tolerance;Decreased balance;Decreased mobility;Decreased coordination;Decreased knowledge of use of DME;Pain       PT Treatment Interventions DME instruction;Gait training;Stair training;Functional mobility training;Therapeutic activities;Therapeutic exercise;Balance training;Patient/family education    PT Goals (Current goals can be found in the Care Plan section)  Acute Rehab PT Goals Patient Stated Goal: to go home PT Goal Formulation: With patient Time For Goal Achievement: 2018-04-14 Potential to Achieve Goals: Good    Frequency  Min 2X/week   Barriers to discharge        Co-evaluation               AM-PAC PT "6 Clicks" Daily Activity  Outcome Measure Difficulty turning over in bed (including adjusting bedclothes, sheets and blankets)?: A Little Difficulty moving from lying on back to sitting on the side of the bed? : Unable Difficulty sitting down on and standing up from a chair with arms (e.g., wheelchair, bedside commode, etc,.)?: Unable Help needed moving to and from a bed to chair (including a wheelchair)?: A Little Help needed walking in hospital room?: A Lot Help needed climbing 3-5 steps with a railing? : A Lot 6 Click Score: 12    End of Session Equipment Utilized During Treatment: Gait belt Activity Tolerance: Patient tolerated treatment well;Patient limited by fatigue Patient left: in chair;with call bell/phone within reach;with chair alarm set;with family/visitor present Nurse Communication: Mobility status PT Visit Diagnosis: Unsteadiness on feet (R26.81);Other abnormalities of gait and mobility (R26.89);Muscle weakness (generalized) (M62.81);Difficulty in walking, not elsewhere classified (R26.2);Pain Pain - Right/Left: Right Pain - part of body: Shoulder    Time: 8299-3716 PT Time Calculation (min) (ACUTE ONLY): 30 min   Charges:              Yolonda Kida, SPT  Maeson Lourenco 03/03/2018, 12:15 PM

## 2018-03-03 NOTE — Progress Notes (Signed)
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 50% (TPS) Chemotherapy/Immunotherapy LOT: Initial Chemotherapy/Immunotherapy Molecular Targeted Therapy: Not Appropriate ALK Translocation Status: Negative EGFR Mutation Status: Negative/Wild Type BRAF V600E Mutation Status: Negative Performance Status: PS = 2 Intent of Therapy: Non-Curative / Palliative Intent, Discussed with Patient

## 2018-03-03 NOTE — Consult Note (Signed)
Consultation Note Date: 03/03/2018   Patient Name: Dana Ramirez  DOB: 10-Feb-1951  MRN: [id number]  Age / Sex: 67 y.o., female  PCP: Crecencio Mc, MD Referring Physician: Henreitta Leber, MD  Reason for Consultation: Establishing goals of care  HPI/Patient Profile: 67 y.o. female admitted on 03/01/2018 from home with complaints of right flank pain, nausea, and shortness of breath. 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 Patient was seen at the [facility] prior to admission with a fever, cough, fatigue, and weakness. CT of chest showed possible pneumonia and also metastatic lymphadenopathy showed some improvement, however there was also a new soft tissue nodule along the right diaphragm concerning for metastatic disease. . Due to nausea she was unable to take oral antibiotics and was suggested to be seen for possible admission. She was given IV dose of Levaquin in the clinic along with morphine and Zofran. Since admission patient continues to complaint of weakness, shortness of breath, and left flank pain. She has been seen by Urology and Oncology. Palliative Medicine team consulted for goals of care discussion.   Clinical Assessment and Goals of Care: I have reviewed medical records including lab results, imaging, Epic notes, and MAR, received report from the bedside RN, and assessed the patient. I then met at the bedside with patient, daughter-Dana Ramirez, and husband Elenore Rota to discuss diagnosis prognosis, GOC,  disposition and options. Patient is out of bed up in recliner. She appears to be more awake today. She denies pain or discomfort at this time. She is A&O x3. Patient is able to engage in goals of care discussion. Shortness of breath noted  during conversation.   I introduced Palliative Medicine as specialized medical care for people living with serious illness. It focuses on providing relief from the symptoms and stress of a serious illness. The goal is to improve quality of life for both the patient and the family.  We discussed a brief life review of the patient. 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 She has been married to her husband for over 98 years and they have 2 children (son and daughter). Her family is a huge support to her. She is of [place] faith. She reports she loves spending time with her family and going to the beach. She is tearful when discussing family as expresses that her father is suffering from advanced CHF and her step-mom had a massive stroke. She has been their main caretakers in addition to some hired home health and she is upset that now she is sick and has been unable to care for him. She states they are now suggesting that her father is placed in hospice.   As far as functional and nutritional status patient and family reports several weeks ago despite having chemo treatments she was doing fairly well. Although her appetite has decreased some with treatments she still ate ok  and was able to ambulate and perform all ADLs without assistance. She reports over the past 2 weeks her health has declined. She began noticing a difference after taking a family trip to the beach and becoming more fatigued and requiring several naps throughout the day. Family reports even prior to the trip patient was driving to [facility] to care for her father and step-mother while having chemo and radiation treatments herself. Over the past week her and her family reports increased weakness, fatigue, shortness of breath, and some nausea. There were days she states it took everything in her to get out of bed and gain some energy.    We discussed her current illness and what it means in the larger context of  her on-going co-morbidities.  Natural disease trajectory and expectations at EOL were discussed. Patient and family verbalizes their understanding of her illness. They were able to verbalize the future plans based on their discussion with Dr. Tasia Catchings (Oncologist) on yesterday. She is hopeful to regain some strength and functional status to be able to restart her chemo with an addition of immunotherapy. Patient and family remains hopeful for the best but is somewhat prepared for the worst given there are indications of new metastasis.   I attempted to elicit values and goals of care important to the patient and her family.    Again patient would like to continue to treat the treatable while hospitalized with hopes of recovering to her baseline if not better. She is prepared to continue with treatments as recommended.   Advanced directives, concepts specific to code status, artifical feeding and hydration, and rehospitalization were considered and discussed. Patient verbalized her and her husband have completed a living will and healthcare power of attorney. She verbalizes that her husband is her designated POA and that she does not want any life-sustaining measures such as CPR/intubation. We discussed her wishes in details and patient verbalized understanding. She would like to be made a DNR/DNI at this time. We discussed that the bedside RN will place a purple bracelet on her to notify all staff of her medical wishes in the event of an emergency and if her heart was to stop or she was to stop breathing she would be allowed a natural death without intervention of resuscitation. Again patient and family verbalized understanding and agreement to her wishes.   Hospice and Palliative Care services outpatient were explained and offered. At this time given patient is continuing with Oncology interventions she is aware that she is not a candidate for hospice. She verbalized her awareness and that at this time that is not  what she would like. She does agree and verbalizes that she would like outpatient Palliative services once discharged. Family also verbalized agreement.   Questions and concerns were addressed.  Hard Choices booklet left for review. The family was encouraged to call with questions or concerns.  PMT will continue to support holistically.  Primary Decision Maker: Patient is A&O x3 and capable of making her own medical decisions. In the event she is not able to make decisions patient verbalized her husband Danaria Larsen is her documented HCPOA.   SUMMARY OF RECOMMENDATIONS    DNR/DNI-as requested by patient and family   Continue to treat the treatable while hospitalized. Patient and family remains hopeful for her improvement and the ability to restart chemotherapy in addition to immunotherapy as recommended by Dr. Tasia Catchings.   Patient and family would like outpatient Palliative to follow at discharge.   Case  Management consult for outpatient Palliative.   Patient verbalizes current pain regimen is controlling pain. Tramadol/Tylenol. She verbalizes wanting to limit use of morphine given how it made her feel and the drop in her blood pressure.   Patient verbalizes her cough is being controlled with Tessalon/Albuterol nebulizer and is aware that Tussionex is also available if needed.   Palliative will continue to support patient, family, and medical team during hospitalization.   Code Status/Advance Care Planning:  DNR/DNI-as requested by patient and family    Symptom Management:   Pain-Tramadol/Tylenol PRN  Cough and shortness of breath-Tessalon/Tussionex/Albuterol Nebs PRN   Palliative Prophylaxis:   Aspiration, Bowel Regimen, Frequent Pain Assessment, Oral Care and Turn Reposition  Additional Recommendations (Limitations, Scope, Preferences):  Full Scope Treatment-continue to treat the treatable   Psycho-social/Spiritual:   Desire for further Chaplaincy support:NO   Prognosis:    Unable to determine-Guarded to poor in the setting of pneumonia, hypertension, diabetes, CAD, pneumonia, hydronephrosis, metastatic 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   Discharge Planning: Outpatient Palliative Care       Primary Diagnoses: Present on Admission: **None**   I have reviewed the medical record, interviewed the patient and family, and examined the patient. The following aspects are pertinent.  Past Medical History:  Diagnosis Date  . CAD (coronary artery disease)   . Carotid arterial disease ([place])   . Diabetes mellitus without complication ([place])   . History of vertigo   . Hyperlipidemia   . Hypertension   . Kidney stone    Social History   Socioeconomic History  . Marital status: Married    Spouse name: Elenore Rota   . Number of children: 2  . Years of education: Not on file  . Highest education level: Not on file  Occupational History  . Occupation: Retired     Fish farm manager: OTHER    Comment: [employer]  Social Needs  . Financial resource strain: Not on file  . Food insecurity:    Worry: Not on file    Inability: Not on file  . Transportation needs:    Medical: Not on file    Non-medical: Not on file  Tobacco Use  . Smoking status: Never Smoker  . Smokeless tobacco: Never Used  Substance and Sexual Activity  . Alcohol use: No  . Drug use: No  . Sexual activity: Not on file  Lifestyle  . Physical activity:    Days per week: Not on file    Minutes per session: Not on file  . Stress: Not on file  Relationships  . Social connections:    Talks on phone: Not on file    Gets together: Not on file    Attends religious service: Not on file    Active member of club or organization: Not on file    Attends meetings of clubs or organizations: Not on file    Relationship status: Not on file  Other Topics Concern  . Not on file  Social History Narrative   Married   Gets regular exercise, once or  twice every week   Family History  Problem Relation Age of Onset  . Heart disease Mother   . Heart disease Father 27  . Stroke Sister 52  . Heart disease Sister   . Diabetes Brother   . Coronary artery disease Other        Strong family history of CAD and CABG  . Heart disease Maternal Grandmother   .  Breast cancer Maternal Grandmother   . Heart disease Maternal Grandfather   . Heart disease Paternal Grandmother   . Breast cancer Paternal Grandmother 20  . Heart disease Paternal Grandfather    Scheduled Meds: . aspirin EC  81 mg Oral BID  . cholecalciferol  1,000 Units Oral Daily  . docusate sodium  100 mg Oral BID  . enoxaparin (LOVENOX) injection  40 mg Subcutaneous Q24H  . insulin aspart  0-9 Units Subcutaneous TID AC & HS  . ipratropium-albuterol  3 mL Nebulization Once  . metoprolol succinate  25 mg Oral Daily  . pantoprazole  40 mg Oral Daily  . predniSONE  5 mg Oral Q breakfast  . rosuvastatin  20 mg Oral QPM  . cyanocobalamin  1,000 mcg Oral Daily   Continuous Infusions: . sodium chloride 100 mL/hr at 03/03/18 0230  . ceFEPime (MAXIPIME) IV Stopped (03/03/18 1118)   PRN Meds:.acetaminophen, albuterol, benzonatate, chlorpheniramine-HYDROcodone, docusate sodium, ketorolac, LORazepam, morphine injection, ondansetron (ZOFRAN) IV, traMADol Medications Prior to Admission:  Prior to Admission medications   Medication Sig Start Date End Date Taking? Authorizing Provider  acetaminophen (TYLENOL) 650 MG CR tablet Take 650 mg by mouth every 8 (eight) hours as needed for pain.   Yes [provider]  aspirin (ASPIR-LOW) 81 MG EC tablet Take 81 mg by mouth 2 (two) times daily.    Yes [provider]  Cholecalciferol (VITAMIN D3) 1000 units CAPS Take 1,000 Units by mouth daily.    Yes [provider]  cyanocobalamin 1000 MCG tablet Take 1,000 mcg by mouth daily.   Yes [provider]  JANUVIA 100 MG tablet TAKE ONE TABLET BY MOUTH DAILY Patient  taking differently: Take 100 mg by mouth daily.  08/13/17  Yes Crecencio Mc, MD  lidocaine-prilocaine (EMLA) cream Apply to affected area once Patient taking differently: Apply 1 application topically once. Prior to treatment 01/06/18  Yes Earlie Server, MD  LORazepam (ATIVAN) 0.5 MG tablet Take 1 tablet (0.5 mg total) by mouth every 8 (eight) hours as needed for anxiety (nausea). 02/11/18  Yes Earlie Server, MD  metFORMIN (GLUCOPHAGE) 1000 MG tablet TAKE ONE TABLET BY MOUTH DAILY Patient taking differently: Take 1,000 mg by mouth every evening.  05/20/17  Yes Crecencio Mc, MD  metoprolol succinate (TOPROL-XL) 25 MG 24 hr tablet TAKE 1 TABLET DAILY Patient taking differently: Take 25 mg by mouth daily.  09/03/17  Yes Minna Merritts, MD  naproxen sodium (ALEVE) 220 MG tablet Take 220 mg by mouth 2 (two) times daily as needed (pain).    Yes [provider]  omeprazole (PRILOSEC OTC) 20 MG tablet Take 20 mg by mouth daily.   Yes [provider]  ondansetron (ZOFRAN) 8 MG tablet Take 1 tablet (8 mg total) by mouth 2 (two) times daily as needed for refractory nausea / vomiting. Start on day 3 after chemo. 01/06/18  Yes Earlie Server, MD  predniSONE (DELTASONE) 5 MG tablet Take 1 tablet (5 mg total) by mouth daily with breakfast. 03/01/18  Yes Verlon Au, NP  rosuvastatin (CRESTOR) 20 MG tablet TAKE ONE TABLET BY MOUTH DAILY Patient taking differently: Take 20 mg by mouth at bedtime.  11/15/17  Yes Gollan, Kathlene November, MD  albuterol (PROVENTIL HFA;VENTOLIN HFA) 108 (90 Base) MCG/ACT inhaler Inhale 2 puffs into the lungs every 6 (six) hours as needed for wheezing or shortness of breath. Patient not taking: Reported on 03/01/2018 12/21/17   Earlie Server, MD  chlorpheniramine-HYDROcodone Amanda Cockayne)  10-8 MG/5ML SUER Take 5 mLs by mouth every 12 (twelve) hours as needed for cough. 03/01/18   Verlon Au, NP  cyclobenzaprine (FLEXERIL) 10 MG tablet Take 1 tablet (10 mg total) by mouth 3 (three) times daily  as needed for muscle spasms. Patient not taking: Reported on 03/01/2018 11/04/17   Crecencio Mc, MD  glipiZIDE (GLUCOTROL XL) 2.5 MG 24 hr tablet TAKE ONE TABLET BY MOUTH EVERY MORNING WITH BREAKFAST Patient not taking: Reported on 03/01/2018 07/28/17   Crecencio Mc, MD  prochlorperazine (COMPAZINE) 10 MG tablet Take 1 tablet (10 mg total) by mouth every 6 (six) hours as needed (Nausea or vomiting). Patient not taking: Reported on 02/09/2018 01/06/18   Earlie Server, MD  silver sulfADIAZINE (SILVADENE) 1 % cream Apply 1 application topically 2 (two) times daily. Patient not taking: Reported on 03/02/2018 02/18/18   Noreene Filbert, MD  traMADol (ULTRAM) 50 MG tablet Take 1 tablet (50 mg total) by mouth every 6 (six) hours as needed. Patient not taking: Reported on 02/09/2018 12/07/17   Crecencio Mc, MD   Allergies  Allergen Reactions  . Codeine Nausea And Vomiting  . Lisinopril Cough  . Penicillins Itching    Has patient had a PCN reaction causing immediate rash, facial/tongue/throat swelling, SOB or lightheadedness with hypotension: no Has patient had a PCN reaction causing severe rash involving mucus membranes or skin necrosis: no Has patient had a PCN reaction that required hospitalization: no Has patient had a PCN reaction occurring within the last 10 years: no If all of the above answers are "NO", then may proceed with Cephalosporin use.    Review of Systems  Constitutional: Positive for activity change, appetite change and fatigue.  Respiratory: Positive for cough and shortness of breath.   Genitourinary: Positive for flank pain.  Neurological: Positive for weakness.  All other systems reviewed and are negative.   Physical Exam  Constitutional: She is oriented to person, place, and time. Vital signs are normal. She is cooperative. She has a sickly appearance.  Cardiovascular: Normal rate, regular rhythm, normal heart sounds and normal pulses.  Pulmonary/Chest: She has decreased breath  sounds.  Shortness of breath, 2L/[place]   Abdominal: Soft. Normal appearance.  Musculoskeletal:  Generalized weakness   Neurological: She is alert and oriented to person, place, and time.  Psychiatric: She has a normal mood and affect. Her speech is normal and behavior is normal. Judgment and thought content normal. Cognition and memory are normal.  Nursing note and vitals reviewed.   Vital Signs: BP 112/64 (BP Location: Left Arm)   Pulse 98   Temp 98.6 F (37 C) (Oral)   Resp 14   Ht 5' 3"  (1.6 m)   Wt 65.8 kg   SpO2 98%   BMI 25.69 kg/m  Pain Scale: 0-10   Pain Score: 3    SpO2: SpO2: 98 % O2 Device:SpO2: 98 % O2 Flow Rate: .O2 Flow Rate (L/min): 2 L/min  IO: Intake/output summary:   Intake/Output Summary (Last 24 hours) at 03/03/2018 1246 Last data filed at 03/03/2018 6333 Gross per 24 hour  Intake 3261.67 ml  Output 300 ml  Net 2961.67 ml    LBM: Last BM Date: (unknown) Baseline Weight: Weight: 65.8 kg Most recent weight: Weight: 65.8 kg     Palliative Assessment/Data:PPS 40 % with reduced appetite    Time In: 1000 Time Out: 1115 Time Total: 75 min.   Greater than 50%  of this time was spent counseling and  coordinating care related to the above assessment and plan.  Signed by:  Alda Lea, NP-BC Palliative Medicine Team  Phone: [phone] Fax: [phone] Pager: [phone] Amion: Bjorn Pippin    Please contact Palliative Medicine Team phone at [phone] for questions and concerns.  For individual provider: See Shea Evans

## 2018-03-03 NOTE — Progress Notes (Signed)
[place] [facility] [place] NAME: Dana Ramirez    MR#:  [id number]  DATE OF BIRTH:  March 31, 1951  SUBJECTIVE:   Patient here due to weakness, shortness of breath and noted to have pneumonia.  Family is at bedside.  Patient still somewhat weak and lethargic.  Complaining of a cough.  Seen by urology and also oncology yesterday.  Creatinine somewhat improved since yesterday.  No plans for ureteral stent or nephrostomy.  REVIEW OF SYSTEMS:    Review of Systems  Constitutional: Negative for chills and fever.  HENT: Negative for congestion and tinnitus.   Eyes: Negative for blurred vision and double vision.  Respiratory: Positive for cough and shortness of breath. Negative for wheezing.   Cardiovascular: Negative for chest pain, orthopnea and PND.  Gastrointestinal: Negative for abdominal pain, diarrhea, nausea and vomiting.  Genitourinary: Negative for dysuria and hematuria.  Neurological: Positive for weakness (Generalized. ). Negative for dizziness, sensory change and focal weakness.  All other systems reviewed and are negative.   Nutrition: Heart Healthy/Carb control Tolerating Diet: Yes Tolerating PT: Await Eval.   DRUG ALLERGIES:   Allergies  Allergen Reactions  . Codeine Nausea And Vomiting  . Lisinopril Cough  . Penicillins Itching    Has patient had a PCN reaction causing immediate rash, facial/tongue/throat swelling, SOB or lightheadedness with hypotension: no Has patient had a PCN reaction causing severe rash involving mucus membranes or skin necrosis: no Has patient had a PCN reaction that required hospitalization: no Has patient had a PCN reaction occurring within the last 10 years: no If all of the above answers are "NO", then may proceed with Cephalosporin use.     VITALS:  Blood pressure 122/82, pulse (!) 133, temperature 97.9 F (36.6 C), temperature source Oral, resp. rate 19, height 5\' 3"  (1.6 m), weight 65.8 kg, SpO2 97  %.  PHYSICAL EXAMINATION:   Physical Exam  GENERAL:  67 y.o.-year-old patient lying in bed lethargic but in NAD.   EYES: Pupils equal, round, reactive to light and accommodation. No scleral icterus. Extraocular muscles intact.  HEENT: Head atraumatic, normocephalic. Oropharynx and nasopharynx clear.  NECK:  Supple, no jugular venous distention. No thyroid enlargement, no tenderness.  LUNGS: Poor Resp. effort, no wheezing, rales, rhonchi. No use of accessory muscles of respiration.  CARDIOVASCULAR: S1, S2 normal. No murmurs, rubs, or gallops.  ABDOMEN: Soft, nontender, nondistended. Bowel sounds present. No organomegaly or mass.  EXTREMITIES: No cyanosis, clubbing or edema b/l.    NEUROLOGIC: Cranial nerves II through XII are intact. No focal Motor or sensory deficits b/l.  Globally weak.  PSYCHIATRIC: The patient is alert and oriented x 3.  SKIN: No obvious rash, lesion, or ulcer.    LABORATORY PANEL:   CBC Recent Labs  Lab 03/03/18 0447  WBC 9.6  HGB 8.4*  HCT 24.4*  PLT 143*   ------------------------------------------------------------------------------------------------------------------  Chemistries  Recent Labs  Lab 03/01/18 0915  03/03/18 0447  NA 131*   < > 134*  K 3.8   < > 3.9  CL 96*   < > 107  CO2 22   < > 20*  GLUCOSE 321*   < > 176*  BUN 24*   < > 18  CREATININE 1.13*   < > 1.21*  CALCIUM 9.2   < > 6.8*  AST 19  --   --   ALT 17  --   --   ALKPHOS 87  --   --  BILITOT 0.5  --   --    < > = values in this interval not displayed.   ------------------------------------------------------------------------------------------------------------------  Cardiac Enzymes No results for input(s): TROPONINI in the last 168 hours. ------------------------------------------------------------------------------------------------------------------  RADIOLOGY:  Ct Abdomen Pelvis Wo Contrast  Result Date: 03/01/2018 CLINICAL DATA:  History of non small cell lung  carcinoma with fevers and nausea EXAM: CT ABDOMEN AND PELVIS WITHOUT CONTRAST TECHNIQUE: Multidetector CT imaging of the abdomen and pelvis was performed following the standard protocol without IV contrast. COMPARISON:  12/24/2017 PET-CT FINDINGS: Lower chest: Chronic changes are noted in the right lower lobe stable from the prior PET-CT is well as a CT examination from 12/29/2014. Some slight increased interstitial changes are noted which may be related to lymphangitic spread of carcinoma. The left lung base is within normal limits. Along the inferior aspect of the right hemidiaphragm posteriorly there is a 1.8 cm soft tissue nodule best seen on image number 21 of series 2. When compare with the prior PET-CT this was not present and likely represents a metastatic focus. Hepatobiliary: No focal liver abnormality is seen. Status post cholecystectomy. No biliary dilatation. Pancreas: Unremarkable. No pancreatic ductal dilatation or surrounding inflammatory changes. Spleen: Normal in size without focal abnormality. Adrenals/Urinary Tract: Adrenal glands are within normal limits. Bladder is decompressed. Kidneys are well visualized bilaterally. Some mild hydronephrotic changes are noted on the left which extend inferiorly into the mid ureter. No definitive stone is seen although some soft tissue thickening of the ureter is noted. Possibility of underlying mass lesion cannot be totally excluded. The bladder is partially distended. Stomach/Bowel: Scattered diverticular change of the colon is noted without evidence of diverticulitis. The appendix is within normal limits. No small bowel abnormality is seen. Vascular/Lymphatic: Aortic atherosclerosis. No enlarged abdominal or pelvic lymph nodes. Reproductive: Uterus and bilateral adnexa are unremarkable. Other: No abdominal wall hernia or abnormality. No abdominopelvic ascites. Musculoskeletal: Degenerative changes of the lumbar spine are seen. IMPRESSION: Changes in the  right lung base predominately chronic in nature although some changes consistent with lymphangitic spread of carcinoma are present. These are new from the prior PET-CT. Nodule along the right hemidiaphragm posteriorly likely representing a metastatic deposit. Obstructive changes of the left renal collecting system and proximal ureter. Some thickening of the ureter is seen which may represent a focal lesion. Clinical correlation is recommended. Although not mentioned in the body of the report there are 2 peritoneal soft tissue lesions identified. The largest of these lies on image number 49 of series 2 measuring approximately 18 mm in dimension. This may also represent some metastatic disease. Repeat PET-CT when the patient's condition improves may be helpful. Electronically Signed   By: Inez Catalina M.D.   On: 03/01/2018 21:12   Ct Chest Wo Contrast  Result Date: 03/01/2018 CLINICAL DATA:  Cough and shortness of breath beginning last night. The patient is undergoing chemotherapy and radiation therapy for lung carcinoma. EXAM: CT CHEST WITHOUT CONTRAST TECHNIQUE: Multidetector CT imaging of the chest was performed following the standard protocol without IV contrast. COMPARISON:  PET CT scan 12/24/2017. FINDINGS: Cardiovascular: The patient is status post CABG. Heart size is normal. Calcific aortic atherosclerosis is identified. No pericardial effusion. Mediastinum/Nodes: Previously seen 1.5 cm short axis dimension right supraclavicular node measures 0.8 cm on image 5 of series 2 today. Right suprahilar mass seen on the prior examination measures 4.4 x 2.7 cm today on image 32 compared to 4.9 x 3.6 cm on the prior study (remeasurement). The lesion  abuts the right subclavian artery as on the prior exam. A right infrahilar mass lesion encasing the right mainstem bronchus measures 3.9 x 3.9 cm on image 67 compared to 4.3 x 4.3 cm on the prior exam. There is less mass effect on the right mainstem bronchus. The thyroid  gland and esophagus are unremarkable. Lungs/Pleura: No pleural effusion. Thickened pleura and pleural calcification in the right lung base are unchanged. The lungs are emphysematous. Increased airspace opacity in the posterior right lower lobe since the prior exam is seen medially and worrisome for pneumonia. Scattered, patchy areas of ground-glass attenuation throughout the right lung are not grossly changed. The left lung is clear. Upper Abdomen: A soft tissue nodule measuring 1.8 x 1.6 cm on image 125 posterior to the medial limb of the right adrenal gland is new since the prior study. Also seen is new fullness of the visualized left intrarenal collecting system which is new since the prior exam. Musculoskeletal: No lytic or sclerotic lesion is identified. IMPRESSION: Some increase in airspace opacity in the right lower lobe worrisome for pneumonia. Lung carcinoma with metastatic lymphadenopathy appears improved as described above. Visualized left intrarenal collecting system is dilated worrisome for hydronephrosis, new since the prior examination. Cause for this finding is not identified. Renal ultrasound is recommended for further evaluation. New soft tissue nodule along the medial aspect of the posterior right hemidiaphragm is worrisome for metastatic disease. Aortic Atherosclerosis (ICD10-I70.0) and Emphysema (ICD10-J43.9). Electronically Signed   By: Inge Rise M.D.   On: 03/01/2018 12:37   US Renal  Result Date: 03/02/2018 CLINICAL DATA:  67 year old female with LEFT hydronephrosis. History of RIGHT lung cancer. EXAM: RENAL / URINARY TRACT ULTRASOUND COMPLETE COMPARISON:  03/01/2018 CT and prior studies FINDINGS: Right Kidney: Length: 10.7 cm. Echogenicity within normal limits. No mass or hydronephrosis visualized. Left Kidney: Length: 12 cm. Mild LEFT hydronephrosis appears decreased from recent CT. Echogenicity within normal limits. No solid mass visualized. Bladder: Appears normal for degree of  bladder distention. IMPRESSION: 1. Mild LEFT hydronephrosis, which appears decreased from 03/01/2018 CT. 2. No other significant abnormalities. Electronically Signed   By: Margarette Canada M.D.   On: 03/02/2018 10:46     ASSESSMENT AND PLAN:   67 year old female with past medical history of non-small cell lung cancer, diabetes, hypertension, hyperlipidemia, history of nephrolithiasis, carotid artery disease who presented to the hospital due to shortness of breath, chest pain and cough.  1.  Pneumonia-suspected to be healthcare associated pneumonia. -Patient's MRSA PCR was negative and therefore off vancomycin.  Continue IV cefepime, cultures so far negative.  Patient is currently afebrile.  2. Sepsis- suspected to be secondary to pneumonia. -Continue IV cefepime as mentioned above.  Follow cultures which are currently negative.  Patient is currently afebrile and hemodynamic is stable.  3.  Left flank pain/hydronephrosis- patient had some mild left flank pain and ultrasound showed mild left hydronephrosis.  Seen by urology and given mild acute kidney injury they were considering stent versus percutaneous nephrostomy tube. - Patient's creatinine has improved and her pain has also improved today.  No plans for intervention, urology will continue to follow.  Patient is voiding well for now.  4.  Acute kidney injury- secondary to dehydration/nephrolithiasis/ intrinsic compression from underlying malignancy. -Improved with IV fluids, seen by urology no plans for intervention.  Creatinine improving with supportive care we will continue to monitor.  5.  High Grade non-small cell lung cancer with recurrence-seen by oncology.  Patient is status post chemoradiation but  has now worsening recurrence.  Patient this case to be discussed in tumor board, oncology is considering immunotherapy along with possible chemo. - cont. Further care as per Oncology.   6.  Essential hypertension-continue Toprol.  7.   Hyperlipidemia-continue Crestor.  8.  Anxiety-continue Ativan as needed.  Palliative care consulted to discuss goals of care, given the patient's multiple comorbidities and advanced malignancy prognosis is guarded to poor.  Discussed plan of care with daughter and husband at bedside.  All the records are reviewed and case discussed with Care Management/Social Worker. Management plans discussed with the patient, family and they are in agreement.  CODE STATUS: Full code  DVT Prophylaxis: Lovenox  TOTAL TIME TAKING CARE OF THIS PATIENT: 30 minutes.   POSSIBLE D/C IN 2-3 DAYS, DEPENDING ON CLINICAL CONDITION.   Henreitta Leber M.D on 03/03/2018 at 11:42 AM  Between 7am to 6pm - Pager - [phone]  After 6pm go to [email or web address] - Proofreader  [facility]  Office  [phone]  CC: Primary care physician; Crecencio Mc, MD

## 2018-03-03 NOTE — Progress Notes (Signed)
Pt c/o sob. O2 sats 98% on 2L O2. RR 20. Lung sounds clear on auscultation. IVF infusing at 142ml/hr via portacath. RT notified, neb given with improvement. Slight non pitting swelling to L arm/hand. Per spouse swelling > than yesterday. Per family pt drinks adequate amt of fluids. Notified Dr Verdell Carmine of the above, IVF to be d/c.

## 2018-03-03 NOTE — Progress Notes (Signed)
Inpatient Diabetes Program Recommendations  AACE/ADA:  Consensus Statement on Inpatient Glycemic Control (2015)  Target Ranges:  Prepandial:   less than 140 mg/dL      Peak postprandial:   less than 180 mg/dL (1-2 hours)      Critically ill patients:  140 - 180 mg/dL     Results for Dana Ramirez, Dana Ramirez" (MRN [id number]) as of 03/03/2018 12:40  Ref. Range 03/02/2018 12:07 03/02/2018 17:09 03/02/2018 21:27 03/03/2018 08:34 03/03/2018 12:02  Glucose-Capillary Latest Ref Range: 70 - 99 mg/dL 240 (H) 213 (H) 206 (H) 160 (H) 236 (H)    History: DM, Lung Cancer (getting Chemo), HTN  Home DM Meds: Januvia 100 mg daily                             Metformin 1000 mg daily                             Glipizide 10 mg daily (per Home Med list, pt NOT taking)  Current Orders: Novolog Sensitive Correction Scale/ SSI (0-9 units) TID AC + HS                            Metformin 1000 mg daily   Patient getting Prednisone 5 mg daily.  CBGs elevated since admission.    MD- Please consider the following in-hospital insulin adjustments:  Increase Novolog correction scale to Moderate correction (0-15 units) ac and hs.  May also consider checking current A1c as last was checked Dec 2018.  -- Will follow during hospitalization.--  Jonna Clark RN, MSN Diabetes Coordinator Inpatient Glycemic Control Team Team Pager: [phone] (8am-5pm)

## 2018-03-03 NOTE — Progress Notes (Signed)
Urology Consult Follow Up  Subjective: Flank pain resolved.  Creatinine improving.  Fever curve also improving.  Discussed today tumor board.  Anti-infectives: Anti-infectives (From admission, onward)   Start     Dose/Rate Route Frequency Ordered Stop   03/02/18 0200  vancomycin (VANCOCIN) IVPB 1000 mg/200 mL premix  Status:  Discontinued     1,000 mg 200 mL/hr over 60 Minutes Intravenous Every 24 hours 03/01/18 1910 03/02/18 0916   03/01/18 2000  ceFEPIme (MAXIPIME) 2 g in sodium chloride 0.9 % 100 mL IVPB     2 g 200 mL/hr over 30 Minutes Intravenous Every 12 hours 03/01/18 1806     03/01/18 1815  vancomycin (VANCOCIN) IVPB 1000 mg/200 mL premix     1,000 mg 200 mL/hr over 60 Minutes Intravenous  Once 03/01/18 1806 03/01/18 1941      Current Facility-Administered Medications  Medication Dose Route Frequency Provider Last Rate Last Dose  . acetaminophen (TYLENOL) tablet 650 mg  650 mg Oral Q6H PRN Vaughan Basta, MD   650 mg at 03/03/18 1047  . albuterol (PROVENTIL) (2.5 MG/3ML) 0.083% nebulizer solution 3 mL  3 mL Inhalation Q6H PRN Vaughan Basta, MD   3 mL at 03/03/18 1345  . aspirin EC tablet 81 mg  81 mg Oral BID Vaughan Basta, MD   81 mg at 03/03/18 0948  . benzonatate (TESSALON) capsule 200 mg  200 mg Oral Q6H PRN Saundra Shelling, MD   200 mg at 03/03/18 1047  . ceFEPIme (MAXIPIME) 2 g in sodium chloride 0.9 % 100 mL IVPB  2 g Intravenous Q12H Pernell Dupre, RPH   Stopped at 03/03/18 1118  . chlorpheniramine-HYDROcodone (TUSSIONEX) 10-8 MG/5ML suspension 5 mL  5 mL Oral Q12H PRN Vaughan Basta, MD   5 mL at 03/02/18 1219  . cholecalciferol (VITAMIN D) tablet 1,000 Units  1,000 Units Oral Daily Vaughan Basta, MD   1,000 Units at 03/03/18 0948  . docusate sodium (COLACE) capsule 100 mg  100 mg Oral BID PRN Vaughan Basta, MD      . docusate sodium (COLACE) capsule 100 mg  100 mg Oral BID Vaughan Basta, MD   100 mg at  03/03/18 0947  . enoxaparin (LOVENOX) injection 40 mg  40 mg Subcutaneous Q24H Saundra Shelling, MD   40 mg at 03/02/18 2056  . insulin aspart (novoLOG) injection 0-9 Units  0-9 Units Subcutaneous TID AC & HS Lance Coon, MD   3 Units at 03/03/18 1248  . ipratropium-albuterol (DUONEB) 0.5-2.5 (3) MG/3ML nebulizer solution 3 mL  3 mL Nebulization Once Arta Silence, MD      . ketorolac (TORADOL) 15 MG/ML injection 15 mg  15 mg Intravenous Q6H PRN Henreitta Leber, MD      . LORazepam (ATIVAN) tablet 0.5 mg  0.5 mg Oral Q8H PRN Vaughan Basta, MD   0.5 mg at 03/01/18 2021  . metoprolol succinate (TOPROL-XL) 24 hr tablet 25 mg  25 mg Oral Daily Vaughan Basta, MD   25 mg at 03/03/18 0947  . morphine 2 MG/ML injection 2 mg  2 mg Intravenous Q4H PRN Pyreddy, Reatha Harps, MD      . ondansetron (ZOFRAN) injection 4 mg  4 mg Intravenous Q6H PRN Vaughan Basta, MD   4 mg at 03/02/18 1219  . pantoprazole (PROTONIX) EC tablet 40 mg  40 mg Oral Daily Vaughan Basta, MD   40 mg at 03/03/18 0948  . predniSONE (DELTASONE) tablet 5 mg  5 mg Oral Q breakfast Vaughan Basta,  MD   5 mg at 03/03/18 0948  . rosuvastatin (CRESTOR) tablet 20 mg  20 mg Oral QPM Vaughan Basta, MD   20 mg at 03/02/18 1719  . traMADol (ULTRAM) tablet 50 mg  50 mg Oral Q6H PRN Saundra Shelling, MD   50 mg at 03/03/18 1358  . vitamin B-12 (CYANOCOBALAMIN) tablet 1,000 mcg  1,000 mcg Oral Daily Vaughan Basta, MD   1,000 mcg at 03/03/18 0947     Objective: Vital signs in last 24 hours: Temp:  [97.7 F (36.5 C)-98.6 F (37 C)] 98.6 F (37 C) (08/15 1200) Pulse Rate:  [98-133] 98 (08/15 1200) Resp:  [14-20] 14 (08/15 1200) BP: (94-130)/(49-82) 112/64 (08/15 1200) SpO2:  [97 %-100 %] 98 % (08/15 1200)  Intake/Output from previous day: 08/14 0701 - 08/15 0700 In: 3501.7 [P.O.:240; I.V.:3035; IV Piggyback:226.7] Out: 300 [Urine:300] Intake/Output this shift: No intake/output data  recorded.   Physical Exam  Constitutional: She is oriented to person, place, and time. She appears well-developed.  Family at bedside including daughter and husband  HENT:  Head: Normocephalic.  Pulmonary/Chest:  Wearing 02  Abdominal: Soft. Bowel sounds are normal. She exhibits no distension.  Neurological: She is alert and oriented to person, place, and time.  Skin: Skin is warm and dry.  Psychiatric: She has a normal mood and affect.  Vitals reviewed.   Lab Results:  Recent Labs    03/02/18 0436 03/03/18 0447  WBC 10.3 9.6  HGB 9.5* 8.4*  HCT 28.3* 24.4*  PLT 154 143*   BMET Recent Labs    03/02/18 0436 03/03/18 0447  NA 131* 134*  K 3.7 3.9  CL 100 107  CO2 22 20*  GLUCOSE 285* 176*  BUN 19 18  CREATININE 1.38* 1.21*  CALCIUM 7.1* 6.8*    Studies/Results: Ct Abdomen Pelvis Wo Contrast  Result Date: 03/01/2018 CLINICAL DATA:  History of non small cell lung carcinoma with fevers and nausea EXAM: CT ABDOMEN AND PELVIS WITHOUT CONTRAST TECHNIQUE: Multidetector CT imaging of the abdomen and pelvis was performed following the standard protocol without IV contrast. COMPARISON:  12/24/2017 PET-CT FINDINGS: Lower chest: Chronic changes are noted in the right lower lobe stable from the prior PET-CT is well as a CT examination from 12/29/2014. Some slight increased interstitial changes are noted which may be related to lymphangitic spread of carcinoma. The left lung base is within normal limits. Along the inferior aspect of the right hemidiaphragm posteriorly there is a 1.8 cm soft tissue nodule best seen on image number 21 of series 2. When compare with the prior PET-CT this was not present and likely represents a metastatic focus. Hepatobiliary: No focal liver abnormality is seen. Status post cholecystectomy. No biliary dilatation. Pancreas: Unremarkable. No pancreatic ductal dilatation or surrounding inflammatory changes. Spleen: Normal in size without focal abnormality.  Adrenals/Urinary Tract: Adrenal glands are within normal limits. Bladder is decompressed. Kidneys are well visualized bilaterally. Some mild hydronephrotic changes are noted on the left which extend inferiorly into the mid ureter. No definitive stone is seen although some soft tissue thickening of the ureter is noted. Possibility of underlying mass lesion cannot be totally excluded. The bladder is partially distended. Stomach/Bowel: Scattered diverticular change of the colon is noted without evidence of diverticulitis. The appendix is within normal limits. No small bowel abnormality is seen. Vascular/Lymphatic: Aortic atherosclerosis. No enlarged abdominal or pelvic lymph nodes. Reproductive: Uterus and bilateral adnexa are unremarkable. Other: No abdominal wall hernia or abnormality. No abdominopelvic ascites. Musculoskeletal: Degenerative  changes of the lumbar spine are seen. IMPRESSION: Changes in the right lung base predominately chronic in nature although some changes consistent with lymphangitic spread of carcinoma are present. These are new from the prior PET-CT. Nodule along the right hemidiaphragm posteriorly likely representing a metastatic deposit. Obstructive changes of the left renal collecting system and proximal ureter. Some thickening of the ureter is seen which may represent a focal lesion. Clinical correlation is recommended. Although not mentioned in the body of the report there are 2 peritoneal soft tissue lesions identified. The largest of these lies on image number 49 of series 2 measuring approximately 18 mm in dimension. This may also represent some metastatic disease. Repeat PET-CT when the patient's condition improves may be helpful. Electronically Signed   By: Inez Catalina M.D.   On: 03/01/2018 21:12   US Renal  Result Date: 03/02/2018 CLINICAL DATA:  67 year old female with LEFT hydronephrosis. History of RIGHT lung cancer. EXAM: RENAL / URINARY TRACT ULTRASOUND COMPLETE COMPARISON:   03/01/2018 CT and prior studies FINDINGS: Right Kidney: Length: 10.7 cm. Echogenicity within normal limits. No mass or hydronephrosis visualized. Left Kidney: Length: 12 cm. Mild LEFT hydronephrosis appears decreased from recent CT. Echogenicity within normal limits. No solid mass visualized. Bladder: Appears normal for degree of bladder distention. IMPRESSION: 1. Mild LEFT hydronephrosis, which appears decreased from 03/01/2018 CT. 2. No other significant abnormalities. Electronically Signed   By: Margarette Canada M.D.   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  Briefly discussed with the patient and her family discussion today which will likely involve transition to immunottherapy when she is recovered from this acute illness.  Plan:   1. Left hydronephrosis- mild hydronephrosis. Will as Dr. Tasia Catchings to repeat renal ultrasound in 2 weeks to assess for any progression.  2.  Acute kidney injury-renal function improving, multifactorial.  Urology will sign off for now.      LOS: 2 days    Hollice Espy 03/03/2018

## 2018-03-03 NOTE — Progress Notes (Signed)
New referral for outpatient Palliative to follow at home received following a Palliative Medicine Consult. Jennette aware. Patient information faxed to referral. No discharge date at this time. Flo Shanks RN,BSN, [facility], hospital Liaison [phone]

## 2018-03-03 NOTE — Progress Notes (Signed)
OT Cancellation Note  Patient Details Name: Dana Ramirez MRN: [id number] DOB: 23-May-1951   Cancelled Treatment:    Reason Eval/Treat Not Completed: Other (comment). On 2nd attempt, pt in bathroom with nursing for bath. Family in room. Agreeable to OT re-attempting evaluation next date to allow pt time to rest. Handout on energy conservation strategies provided. Will follow up next date.   Jeni Salles, MPH, MS, OTR/L ascom [phone] 03/03/18, 2:18 PM

## 2018-03-03 NOTE — Progress Notes (Signed)
Patient became short of breath  MD notified, Duo nebs and IV fluids decreased per MD order.

## 2018-03-04 ENCOUNTER — Inpatient Hospital Stay: Payer: Medicare Other | Admitting: Oncology

## 2018-03-04 ENCOUNTER — Inpatient Hospital Stay: Payer: Medicare Other

## 2018-03-04 ENCOUNTER — Encounter: Payer: Self-pay | Admitting: *Deleted

## 2018-03-04 DIAGNOSIS — J181 Lobar pneumonia, unspecified organism: Secondary | ICD-10-CM

## 2018-03-04 LAB — CBC
HCT: 25.5 % — ABNORMAL LOW (ref 35.0–47.0)
HEMOGLOBIN: 8.7 g/dL — AB (ref 12.0–16.0)
MCH: 32.6 pg (ref 26.0–34.0)
MCHC: 34.3 g/dL (ref 32.0–36.0)
MCV: 95 fL (ref 80.0–100.0)
Platelets: 171 10*3/uL (ref 150–440)
RBC: 2.68 MIL/uL — ABNORMAL LOW (ref 3.80–5.20)
RDW: 19.3 % — ABNORMAL HIGH (ref 11.5–14.5)
WBC: 13.5 10*3/uL — ABNORMAL HIGH (ref 3.6–11.0)

## 2018-03-04 LAB — GLUCOSE, CAPILLARY
GLUCOSE-CAPILLARY: 148 mg/dL — AB (ref 70–99)
GLUCOSE-CAPILLARY: 255 mg/dL — AB (ref 70–99)
GLUCOSE-CAPILLARY: 230 mg/dL — AB (ref 70–99)
Glucose-Capillary: 245 mg/dL — ABNORMAL HIGH (ref 70–99)

## 2018-03-04 LAB — BASIC METABOLIC PANEL
Anion gap: 5 (ref 5–15)
BUN: 19 mg/dL (ref 8–23)
CALCIUM: 7.5 mg/dL — AB (ref 8.9–10.3)
CHLORIDE: 107 mmol/L (ref 98–111)
CO2: 21 mmol/L — ABNORMAL LOW (ref 22–32)
CREATININE: 1.25 mg/dL — AB (ref 0.44–1.00)
GFR calc non Af Amer: 44 mL/min — ABNORMAL LOW (ref >60)
GFR, EST AFRICAN AMERICAN: 51 mL/min — AB (ref >60)
Glucose, Bld: 187 mg/dL — ABNORMAL HIGH (ref 70–99)
Potassium: 4.6 mmol/L (ref 3.5–5.1)
SODIUM: 133 mmol/L — AB (ref 135–145)

## 2018-03-04 MED ORDER — LACTULOSE 10 GM/15ML PO SOLN
30.0000 g | Freq: Two times a day (BID) | ORAL | Status: DC | PRN
  Administered 2018-03-04 – 2018-03-09 (×3): 30 g via ORAL
  Filled 2018-03-04 (×3): qty 60

## 2018-03-04 MED ORDER — LORAZEPAM 2 MG/ML IJ SOLN
0.5000 mg | Freq: Three times a day (TID) | INTRAMUSCULAR | Status: DC | PRN
  Administered 2018-03-04 – 2018-03-05 (×2): 0.5 mg via INTRAVENOUS
  Filled 2018-03-04 (×2): qty 1

## 2018-03-04 MED ORDER — SODIUM CHLORIDE 0.9% FLUSH: 10.0000 mL | INTRAVENOUS | Status: DC | PRN

## 2018-03-04 MED ORDER — ORAL CARE MOUTH RINSE
15.0000 mL | Freq: Two times a day (BID) | OROMUCOSAL | Status: DC
Start: 2018-03-04 — End: 2018-03-10
  Administered 2018-03-04 – 2018-03-10 (×8): 15 mL via OROMUCOSAL

## 2018-03-04 NOTE — Progress Notes (Signed)
Hematology/Oncology Progress Note [facility] [phone] [phone]  Patient Care Team: Crecencio Mc, MD as PCP - General (Internal Medicine) Minna Merritts, MD (Cardiology) Telford Nab, RN as Registered Nurse Bary Castilla, Forest Gleason, MD (General Surgery) Earlie Server, MD as Medical Oncologist (Medical Oncology)   Name of the patient: Dana Ramirez  [id number]  1951/04/09  Date of visit: 03/04/18   INTERVAL HISTORY-  No acute overnight events.  Patient reports feeling slightly better.  Pain has resolved.  Feels weak. Cough and shortness of breath.    Review of systems- Review of Systems  Constitutional: Positive for malaise/fatigue.  HENT: Positive for sore throat.   Respiratory: Positive for cough, shortness of breath and wheezing.   Cardiovascular: Negative for chest pain.  Gastrointestinal: Negative for abdominal pain, heartburn and vomiting.  Genitourinary: Negative for dysuria.  Musculoskeletal: Negative for back pain.  Skin: Negative for rash.  Neurological: Positive for weakness.  Endo/Heme/Allergies: Does not bruise/bleed easily.  Psychiatric/Behavioral: Negative for suicidal ideas.    Allergies  Allergen Reactions  . Codeine Nausea And Vomiting  . Lisinopril Cough  . Penicillins Itching    Has patient had a PCN reaction causing immediate rash, facial/tongue/throat swelling, SOB or lightheadedness with hypotension: no Has patient had a PCN reaction causing severe rash involving mucus membranes or skin necrosis: no Has patient had a PCN reaction that required hospitalization: no Has patient had a PCN reaction occurring within the last 10 years: no If all of the above answers are "NO", then may proceed with Cephalosporin use.     Patient Active Problem List   Diagnosis Date Noted  . AKI (acute kidney injury) ([place])   . Hydronephrosis, left   . Pneumonia 03/01/2018  . Supraclavicular lymphadenopathy 01/26/2018  .  Goals of care, counseling/discussion 01/07/2018  . Lung cancer ([place]) 01/06/2018  . Malignant neoplasm of overlapping sites of right lung ([place]) 12/28/2017  . Wheezing 12/21/2017  . Mediastinal lymphadenopathy 12/21/2017  . Neck mass 12/08/2017  . Cervical radiculopathy at C8 11/06/2017  . GERD (gastroesophageal reflux disease) 04/10/2017  . 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 B12 deficiency 04/02/2016  . Cough in adult patient 12/28/2015  . Carpal tunnel syndrome 06/03/2015  . Atherosclerosis of coronary artery 06/03/2015  . Hypertension 06/03/2015  . Subclinical hypothyroidism 06/03/2015  . Diabetes mellitus without complication ([place]) 79/89/2119  . Carotid stenosis 12/21/2012  . Hyperlipidemia 12/19/2009  . Atherosclerosis of native coronary artery of native heart with stable angina pectoris ([place]) 12/19/2009     Past Medical History:  Diagnosis Date  . CAD (coronary artery disease)   . Carotid arterial disease ([place])   . Diabetes mellitus without complication ([place])   . History of vertigo   . Hyperlipidemia   . Hypertension   . Kidney stone      Past Surgical History:  Procedure Laterality Date  . BREAST CYST ASPIRATION Left 1980s  . CESAREAN SECTION     x 2  . CHOLECYSTECTOMY    . CORONARY ARTERY BYPASS GRAFT  03/2005   LIMA to the LAD, vein graft to diagonal with a negative stress test in 06/2006  . PORTACATH PLACEMENT Left 12/31/2017   Procedure: INSERTION PORT-A-CATH;  Surgeon: Robert Bellow, MD;  Location: [facility] ORS;  Service: General;  Laterality: Left;  . SENTINEL NODE BIOPSY Right 12/31/2017   Procedure: SUPRACLAVICULAR NODE BIOPSY;  Surgeon: Robert Bellow, MD;  Location: [facility] ORS;  Service: General;  Laterality: Right;  . TONSILLECTOMY  Social History   Socioeconomic History  . Marital status: Married    Spouse name: Elenore Rota   . Number of children: 2  . Years of education: Not on file  . Highest education level: Not on file  Occupational History  .  Occupation: Retired     Fish farm manager: OTHER    Comment: [employer]  Social Needs  . Financial resource strain: Not on file  . Food insecurity:    Worry: Not on file    Inability: Not on file  . Transportation needs:    Medical: Not on file    Non-medical: Not on file  Tobacco Use  . Smoking status: Never Smoker  . Smokeless tobacco: Never Used  Substance and Sexual Activity  . Alcohol use: No  . Drug use: No  . Sexual activity: Not on file  Lifestyle  . Physical activity:    Days per week: Not on file    Minutes per session: Not on file  . Stress: Not on file  Relationships  . Social connections:    Talks on phone: Not on file    Gets together: Not on file    Attends religious service: Not on file    Active member of club or organization: Not on file    Attends meetings of clubs or organizations: Not on file    Relationship status: Not on file  . Intimate partner violence:    Fear of current or ex partner: Not on file    Emotionally abused: Not on file    Physically abused: Not on file    Forced sexual activity: Not on file  Other Topics Concern  . Not on file  Social History Narrative   Married   Gets regular exercise, once or twice every week     Family History  Problem Relation Age of Onset  . Heart disease Mother   . Heart disease Father 71  . Stroke Sister 11  . Heart disease Sister   . Diabetes Brother   . Coronary artery disease Other        Strong family history of CAD and CABG  . Heart disease Maternal Grandmother   . Breast cancer Maternal Grandmother   . Heart disease Maternal Grandfather   . Heart disease Paternal Grandmother   . Breast cancer Paternal Grandmother 42  . Heart disease Paternal Grandfather      Current Facility-Administered Medications:  .  acetaminophen (TYLENOL) tablet 650 mg, 650 mg, Oral, Q6H PRN, Vaughan Basta, MD, 650 mg at 03/03/18 1735 .  albuterol (PROVENTIL) (2.5 MG/3ML) 0.083% nebulizer solution 3 mL, 3 mL,  Inhalation, Q6H PRN, Vaughan Basta, MD, 3 mL at 03/03/18 1345 .  aspirin EC tablet 81 mg, 81 mg, Oral, BID, Vaughan Basta, MD, 81 mg at 03/03/18 2103 .  benzonatate (TESSALON) capsule 200 mg, 200 mg, Oral, Q6H PRN, Pyreddy, Pavan, MD, 200 mg at 03/03/18 1047 .  ceFEPIme (MAXIPIME) 2 g in sodium chloride 0.9 % 100 mL IVPB, 2 g, Intravenous, Q12H, Hallaji, Dani Gobble, RPH, Stopped at 03/03/18 2230 .  chlorpheniramine-HYDROcodone (TUSSIONEX) 10-8 MG/5ML suspension 5 mL, 5 mL, Oral, Q12H PRN, Vaughan Basta, MD, 5 mL at 03/02/18 1219 .  cholecalciferol (VITAMIN D) tablet 1,000 Units, 1,000 Units, Oral, Daily, Vaughan Basta, MD, 1,000 Units at 03/03/18 0948 .  docusate sodium (COLACE) capsule 100 mg, 100 mg, Oral, BID PRN, Vaughan Basta, MD .  docusate sodium (COLACE) capsule 100 mg, 100 mg, Oral, BID, Vaughan Basta, MD, 100 mg  at 03/03/18 2103 .  enoxaparin (LOVENOX) injection 40 mg, 40 mg, Subcutaneous, Q24H, Pyreddy, Pavan, MD, 40 mg at 03/03/18 1739 .  insulin aspart (novoLOG) injection 0-9 Units, 0-9 Units, Subcutaneous, TID AC & HS, Lance Coon, MD, 3 Units at 03/03/18 2225 .  ipratropium-albuterol (DUONEB) 0.5-2.5 (3) MG/3ML nebulizer solution 3 mL, 3 mL, Nebulization, Once, Sridharan, Prasanna, MD .  ketorolac (TORADOL) 15 MG/ML injection 15 mg, 15 mg, Intravenous, Q6H PRN, Verdell Carmine, Belia Heman, MD .  LORazepam (ATIVAN) tablet 0.5 mg, 0.5 mg, Oral, Q8H PRN, Vaughan Basta, MD, 0.5 mg at 03/01/18 2021 .  MEDLINE mouth rinse, 15 mL, Mouth Rinse, BID, Sainani, Vivek J, MD .  metoprolol succinate (TOPROL-XL) 24 hr tablet 25 mg, 25 mg, Oral, Daily, Vaughan Basta, MD, 25 mg at 03/03/18 0947 .  morphine 2 MG/ML injection 2 mg, 2 mg, Intravenous, Q4H PRN, Pyreddy, Pavan, MD .  ondansetron (ZOFRAN) injection 4 mg, 4 mg, Intravenous, Q6H PRN, Vaughan Basta, MD, 4 mg at 03/02/18 1219 .  pantoprazole (PROTONIX) EC tablet 40 mg, 40 mg,  Oral, Daily, Vaughan Basta, MD, 40 mg at 03/03/18 0948 .  predniSONE (DELTASONE) tablet 5 mg, 5 mg, Oral, Q breakfast, Vaughan Basta, MD, 5 mg at 03/03/18 0948 .  rosuvastatin (CRESTOR) tablet 20 mg, 20 mg, Oral, QPM, Vaughan Basta, MD, 20 mg at 03/03/18 1739 .  traMADol (ULTRAM) tablet 50 mg, 50 mg, Oral, Q6H PRN, Pyreddy, Pavan, MD, 50 mg at 03/04/18 0451 .  vitamin B-12 (CYANOCOBALAMIN) tablet 1,000 mcg, 1,000 mcg, Oral, Daily, Vaughan Basta, MD, 1,000 mcg at 03/03/18 0947   Physical exam:  Vitals:   03/03/18 0941 03/03/18 1200 03/03/18 2108 03/04/18 0527  BP: 122/82 112/64 115/73 109/68  Pulse: (!) 133 98 93 93  Resp: 19 14    Temp: 97.9 F (36.6 C) 98.6 F (37 C) 97.7 F (36.5 C) 98 F (36.7 C)  TempSrc: Oral Oral Oral Oral  SpO2: 97% 98% 100% 100%  Weight:      Height:       Physical Exam  Constitutional: She is oriented to person, place, and time. No distress.  HENT:  Head: Normocephalic.  Eyes: EOM are normal.  Neck: Neck supple.  Cardiovascular:  No murmur heard. Pulmonary/Chest: Effort normal. She has wheezes.  Abdominal: Soft. Bowel sounds are normal.  Musculoskeletal: She exhibits edema.  Neurological: She is alert and oriented to person, place, and time.  Skin: Skin is warm and dry.  Psychiatric: Affect normal.       CMP Latest Ref Rng & Units 03/04/2018  Glucose 70 - 99 mg/dL 187(H)  BUN 8 - 23 mg/dL 19  Creatinine 0.44 - 1.00 mg/dL 1.25(H)  Sodium 135 - 145 mmol/L 133(L)  Potassium 3.5 - 5.1 mmol/L 4.6  Chloride 98 - 111 mmol/L 107  CO2 22 - 32 mmol/L 21(L)  Calcium 8.9 - 10.3 mg/dL 7.5(L)  Total Protein 6.5 - 8.1 g/dL -  Total Bilirubin 0.3 - 1.2 mg/dL -  Alkaline Phos 38 - 126 U/L -  AST 15 - 41 U/L -  ALT 0 - 44 U/L -   CBC Latest Ref Rng & Units 03/04/2018  WBC 3.6 - 11.0 K/uL 13.5(H)  Hemoglobin 12.0 - 16.0 g/dL 8.7(L)  Hematocrit 35.0 - 47.0 % 25.5(L)  Platelets 150 - 440 K/uL 171   RADIOGRAPHIC  STUDIES: I have personally reviewed the radiological images as listed and agreed with the findings in the report. Ct Abdomen Pelvis Wo Contrast  Result  Date: 03/01/2018 CLINICAL DATA:  History of non small cell lung carcinoma with fevers and nausea EXAM: CT ABDOMEN AND PELVIS WITHOUT CONTRAST TECHNIQUE: Multidetector CT imaging of the abdomen and pelvis was performed following the standard protocol without IV contrast. COMPARISON:  12/24/2017 PET-CT FINDINGS: Lower chest: Chronic changes are noted in the right lower lobe stable from the prior PET-CT is well as a CT examination from 12/29/2014. Some slight increased interstitial changes are noted which may be related to lymphangitic spread of carcinoma. The left lung base is within normal limits. Along the inferior aspect of the right hemidiaphragm posteriorly there is a 1.8 cm soft tissue nodule best seen on image number 21 of series 2. When compare with the prior PET-CT this was not present and likely represents a metastatic focus. Hepatobiliary: No focal liver abnormality is seen. Status post cholecystectomy. No biliary dilatation. Pancreas: Unremarkable. No pancreatic ductal dilatation or surrounding inflammatory changes. Spleen: Normal in size without focal abnormality. Adrenals/Urinary Tract: Adrenal glands are within normal limits. Bladder is decompressed. Kidneys are well visualized bilaterally. Some mild hydronephrotic changes are noted on the left which extend inferiorly into the mid ureter. No definitive stone is seen although some soft tissue thickening of the ureter is noted. Possibility of underlying mass lesion cannot be totally excluded. The bladder is partially distended. Stomach/Bowel: Scattered diverticular change of the colon is noted without evidence of diverticulitis. The appendix is within normal limits. No small bowel abnormality is seen. Vascular/Lymphatic: Aortic atherosclerosis. No enlarged abdominal or pelvic lymph nodes. Reproductive:  Uterus and bilateral adnexa are unremarkable. Other: No abdominal wall hernia or abnormality. No abdominopelvic ascites. Musculoskeletal: Degenerative changes of the lumbar spine are seen. IMPRESSION: Changes in the right lung base predominately chronic in nature although some changes consistent with lymphangitic spread of carcinoma are present. These are new from the prior PET-CT. Nodule along the right hemidiaphragm posteriorly likely representing a metastatic deposit. Obstructive changes of the left renal collecting system and proximal ureter. Some thickening of the ureter is seen which may represent a focal lesion. Clinical correlation is recommended. Although not mentioned in the body of the report there are 2 peritoneal soft tissue lesions identified. The largest of these lies on image number 49 of series 2 measuring approximately 18 mm in dimension. This may also represent some metastatic disease. Repeat PET-CT when the patient's condition improves may be helpful. Electronically Signed   By: Inez Catalina M.D.   On: 03/01/2018 21:12   Ct Chest Wo Contrast  Result Date: 03/01/2018 CLINICAL DATA:  Cough and shortness of breath beginning last night. The patient is undergoing chemotherapy and radiation therapy for lung carcinoma. EXAM: CT CHEST WITHOUT CONTRAST TECHNIQUE: Multidetector CT imaging of the chest was performed following the standard protocol without IV contrast. COMPARISON:  PET CT scan 12/24/2017. FINDINGS: Cardiovascular: The patient is status post CABG. Heart size is normal. Calcific aortic atherosclerosis is identified. No pericardial effusion. Mediastinum/Nodes: Previously seen 1.5 cm short axis dimension right supraclavicular node measures 0.8 cm on image 5 of series 2 today. Right suprahilar mass seen on the prior examination measures 4.4 x 2.7 cm today on image 32 compared to 4.9 x 3.6 cm on the prior study (remeasurement). The lesion abuts the right subclavian artery as on the prior exam.  A right infrahilar mass lesion encasing the right mainstem bronchus measures 3.9 x 3.9 cm on image 67 compared to 4.3 x 4.3 cm on the prior exam. There is less mass effect on the right  mainstem bronchus. The thyroid gland and esophagus are unremarkable. Lungs/Pleura: No pleural effusion. Thickened pleura and pleural calcification in the right lung base are unchanged. The lungs are emphysematous. Increased airspace opacity in the posterior right lower lobe since the prior exam is seen medially and worrisome for pneumonia. Scattered, patchy areas of ground-glass attenuation throughout the right lung are not grossly changed. The left lung is clear. Upper Abdomen: A soft tissue nodule measuring 1.8 x 1.6 cm on image 125 posterior to the medial limb of the right adrenal gland is new since the prior study. Also seen is new fullness of the visualized left intrarenal collecting system which is new since the prior exam. Musculoskeletal: No lytic or sclerotic lesion is identified. IMPRESSION: Some increase in airspace opacity in the right lower lobe worrisome for pneumonia. Lung carcinoma with metastatic lymphadenopathy appears improved as described above. Visualized left intrarenal collecting system is dilated worrisome for hydronephrosis, new since the prior examination. Cause for this finding is not identified. Renal ultrasound is recommended for further evaluation. New soft tissue nodule along the medial aspect of the posterior right hemidiaphragm is worrisome for metastatic disease. Aortic Atherosclerosis (ICD10-I70.0) and Emphysema (ICD10-J43.9). Electronically Signed   By: Inge Rise M.D.   On: 03/01/2018 12:37   US Renal  Result Date: 03/02/2018 CLINICAL DATA:  67 year old female with LEFT hydronephrosis. History of RIGHT lung cancer. EXAM: RENAL / URINARY TRACT ULTRASOUND COMPLETE COMPARISON:  03/01/2018 CT and prior studies FINDINGS: Right Kidney: Length: 10.7 cm. Echogenicity within normal limits. No mass  or hydronephrosis visualized. Left Kidney: Length: 12 cm. Mild LEFT hydronephrosis appears decreased from recent CT. Echogenicity within normal limits. No solid mass visualized. Bladder: Appears normal for degree of bladder distention. IMPRESSION: 1. Mild LEFT hydronephrosis, which appears decreased from 03/01/2018 CT. 2. No other significant abnormalities. Electronically Signed   By: Margarette Canada M.D.   On: 03/02/2018 10:46    Assessment and plan- Patient is a 67 y.o. female with history of CAD, diabetes, hyperlipidemia, hypertension kidney stone recent diagnosis of high-grade non-small cell lung cancer on chemo plus radiation, presents with cough, fever, acute kidney failure  #Pneumonia, continue IV antibiotics. #High-grade carcinoma of lung, disease progression was discussed with patient.  Discussed with her that CT image is consistent with metastatic disease, patient's condition is no longer curable.  Patient has high expression of PDL 1 TPS as 90%, recommend monotherapy with [facility] outpatient.  Patient asked questions all questions answered to her satisfaction. #AKI, secondary to obstruction changes.  Likely metastatic cancer.  Kidney function improved today.  Continue monitoring. Case was discussed on tumor board.  Discussed with patient's daughter over the phone.  Thank you for allowing me to participate in the care of this patient.  Total face to face encounter time for this patient visit was 25 min. >50% of the time was  spent in counseling and coordination of care.  Earlie Server, MD, PhD Hematology Oncology [facility] Pager- [phone] 03/04/2018

## 2018-03-04 NOTE — Care Management Note (Signed)
Case Management Note  Patient Details  Name: Samaya Boardley MRN: [id number] Date of Birth: 09-29-1950  Subjective/Objective:                 Patient admitted from home with pneumonia.  Currently underging treatment for lung cancer. She is in a weakened state. Lives with her husband and no issues accessing medical care or with transportation.  Spoke with patient, daughter and husband.  Agreeable to home health services.  No agency preference.  Patient's insurance is in network with [facility] and agency can see patient within 24 hours of discharge.  It is anticipated that patient will discharge home 8/17. Requests a walker and transfer bench.  DME will be delivered to patient's room 8/17 by [facility].  Home health staff need to call Mr Bayle to schedule visits because he does not like to use patient's Iphone [phone]  Action/Plan:  Requested order for the DME and home health RN PT OT Aide from attending.     Expected Discharge Date:                  Expected Discharge Plan:     In-House Referral:     Discharge planning Services     Post Acute Care Choice:    Choice offered to:  Spouse, Adult Children, Patient  DME Arranged:  Gilford Rile, transfer bench) DME Agency:  [place]:  RN, PT, OT, Nurse's Aide [place] Agency:  [facility]  Status of Service:  In process, will continue to follow  If discussed at Long Length of Stay Meetings, dates discussed:    Additional Comments:  Katrina Stack, RN 03/04/2018, 4:20 PM

## 2018-03-04 NOTE — Progress Notes (Signed)
Daily Progress Note   Patient Name: Dana Ramirez       Date: 03/04/2018 DOB: [birth year]  Age: 67 y.o. MRN#: [id number] Attending Physician: Henreitta Leber, MD Primary Care Physician: Crecencio Mc, MD Admit Date: 03/01/2018  Reason for Consultation/Follow-up: Establishing goals of care  Subjective: Patient sitting up in bed resting. She denies pain or discomfort. Daughter is at the bedside. Patient and daughter reports she had a better night last night and have worked with PT/OT this morning. Patient is more awake today and is no longer wearing her oxygen. She denies shortness of breath and is pleased that she is feeling better. She reports she has had more of an appetite this morning as well.   Briefly reviewed goals of care discussion on yesterday. Daughter and patient reports they do not have any questions at this time and still in agreement to have outpatient Palliative once she is discharged. Daughter states once patient returns home she will be in need of some medical equipment which she states they discussed with PT this morning. Daughter verbalizes conversation with Dr. Tasia Catchings and states they remain hopeful that patient will be able to proceed with further treatment which will hopefull allow some stability in patient's disease process. They are aware that treatment is moreso palliative focused at this time versus curative. Patient and family is aware that Palliative will be a great resource and of support and can assist with future needs such as transitioning to hospice once they feel it is appropriate. Patient and daughter verbalized understanding.   Chart Reviewed.   Length of Stay: 3  Current Medications: Scheduled Meds:  . aspirin EC  81 mg Oral BID  . cholecalciferol  1,000  Units Oral Daily  . docusate sodium  100 mg Oral BID  . enoxaparin (LOVENOX) injection  40 mg Subcutaneous Q24H  . insulin aspart  0-9 Units Subcutaneous TID AC & HS  . ipratropium-albuterol  3 mL Nebulization Once  . mouth rinse  15 mL Mouth Rinse BID  . metoprolol succinate  25 mg Oral Daily  . pantoprazole  40 mg Oral Daily  . predniSONE  5 mg Oral Q breakfast  . rosuvastatin  20 mg Oral QPM  . cyanocobalamin  1,000 mcg Oral Daily    Continuous Infusions: . ceFEPime (MAXIPIME) IV 2  g (03/04/18 1111)    PRN Meds: acetaminophen, albuterol, benzonatate, chlorpheniramine-HYDROcodone, docusate sodium, ketorolac, LORazepam, morphine injection, ondansetron (ZOFRAN) IV, traMADol  Physical Exam  Constitutional: She is oriented to person, place, and time. Vital signs are normal. She is cooperative. She has a sickly appearance.  Frail in appearance   Cardiovascular: Normal rate, regular rhythm and normal heart sounds.  Pulmonary/Chest: Effort normal. She has decreased breath sounds.  Neurological: She is alert and oriented to person, place, and time.  Nursing note and vitals reviewed.           Vital Signs: BP 126/73 (BP Location: Right Arm)   Pulse (!) 120   Temp 97.6 F (36.4 C) (Oral)   Resp 20   Ht 5\' 3"  (1.6 m)   Wt 65.8 kg   SpO2 99%   BMI 25.69 kg/m  SpO2: SpO2: 99 % O2 Device: O2 Device: Room Air O2 Flow Rate: O2 Flow Rate (L/min): 2 L/min  Intake/output summary:   Intake/Output Summary (Last 24 hours) at 03/04/2018 1254 Last data filed at 03/04/2018 1031 Gross per 24 hour  Intake 240 ml  Output -  Net 240 ml   LBM: Last BM Date: 03/01/18 Baseline Weight: Weight: 65.8 kg Most recent weight: Weight: 65.8 kg       Palliative Assessment/Data: PPS 40%      Patient Active Problem List   Diagnosis Date Noted  . AKI (acute kidney injury) ([place])   . Hydronephrosis, left   . Pneumonia 03/01/2018  . Supraclavicular lymphadenopathy 01/26/2018  . Goals of care,  counseling/discussion 01/07/2018  . Lung cancer ([place]) 01/06/2018  . Malignant neoplasm of overlapping sites of right lung ([place]) 12/28/2017  . Wheezing 12/21/2017  . Mediastinal lymphadenopathy 12/21/2017  . Neck mass 12/08/2017  . Cervical radiculopathy at C8 11/06/2017  . GERD (gastroesophageal reflux disease) 04/10/2017  . 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 B12 deficiency 04/02/2016  . Cough in adult patient 12/28/2015  . Carpal tunnel syndrome 06/03/2015  . Atherosclerosis of coronary artery 06/03/2015  . Hypertension 06/03/2015  . Subclinical hypothyroidism 06/03/2015  . Diabetes mellitus without complication ([place]) 22/08/5425  . Carotid stenosis 12/21/2012  . Hyperlipidemia 12/19/2009  . Atherosclerosis of native coronary artery of native heart with stable angina pectoris ([place]) 12/19/2009    Palliative Care Assessment & Plan   Patient Profile: 67 y.o. female admitted on 03/01/2018 from home with complaints of right flank pain, nausea, and shortness of breath. 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 Patient was seen at the [facility] prior to admission with a fever, cough, fatigue, and weakness. CT of chest showed possible pneumonia and also metastatic lymphadenopathy showed some improvement, however there was also a new soft tissue nodule along the right diaphragm concerning for metastatic disease. . Due to nausea she was unable to take oral antibiotics and was suggested to be seen for possible admission. She was given IV dose of Levaquin in the clinic along with morphine and Zofran. Since admission patient continues to complaint of weakness, shortness of breath, and left flank pain. She has been seen by Urology and Oncology. Palliative Medicine team consulted for goals of care discussion.   Recommendations/Plan:  DNR/DNI-as requested  by patient and family   Continue to treat the treatable while hospitalized. Patient and family remains hopeful for her improvement and the ability to restart chemotherapy in addition to  immunotherapy as recommended by Dr. Tasia Catchings.   Patient and family would like outpatient Palliative to follow at discharge.   Case Management consult for outpatient Palliative.   Patient verbalizes current pain regimen is controlling pain. Tramadol/Tylenol.   Patient verbalizes her cough is being controlled with Tessalon/Albuterol nebulizer and is aware that Tussionex is also available if needed.   Palliative will continue to support patient, family, and medical team during hospitalization.   Goals of Care and Additional Recommendations:  Limitations on Scope of Treatment: Full Scope Treatment  Code Status:    Code Status Orders  (From admission, onward)         Start     Ordered   03/04/18 0509  Do not attempt resuscitation (DNR)  Continuous    Question Answer Comment  In the event of cardiac or respiratory ARREST Do not call a "code blue"   In the event of cardiac or respiratory ARREST Do not perform Intubation, CPR, defibrillation or ACLS   In the event of cardiac or respiratory ARREST Use medication by any route, position, wound care, and other measures to relive pain and suffering. May use oxygen, suction and manual treatment of airway obstruction as needed for comfort.      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   Discharge Planning:  Home with Home Health and outpatient Palliative services.   Care plan was discussed with patient, patient's family, and bedside RN.   Thank you for allowing the Palliative Medicine Team to assist in the care of this patient.   Total Time 25 min Prolonged Time Billed  NO       Greater than 50%  of this time was spent counseling and coordinating care related to the above assessment and plan.  Alda Lea, AGPCNP-BC  Please contact Palliative Medicine Team phone at [phone] for questions and concerns.

## 2018-03-04 NOTE — Progress Notes (Signed)
Physical Therapy Treatment Patient Details Name: Dana Ramirez MRN: [id number] DOB: 1951/07/20 Today's Date: 03/04/2018    History of Present Illness Pt presents to hospital on 03/01/18 for complaints of weakness and SOB. Pt subsequently diagnosed with pneumonia, AKI, and hydonephrosis. Pt is currently undergoing chemotherapy for non small cell lung cancer. Pts other PMH includes CAD, DM, vertigo, HLD, and kidney stones    PT Comments    Pt seen this morning and states that she is doing well and appears much better this morning compared to previous visit. Pt sitting up in chair upon arrival stating that she has independently done there-ex this morning as well as worked with OT. Upon arrival pt not on O2 and O2 saturation 86%, however rises to 93% with verbal cuing for deep breathing. Pt instructed in B LE there-ex and tolerates well however requires intermittent rest breaks. Pt remains on RA throughout rest of session and remained above 90% even with exertion. Pt amb 50' total with CGA and RW requiring one seated rest break half way due to fatigue however pt does not have any LOB with gait and has good self awareness of when she needs to rest. Pt left in chair on RA with O2 saturation 94%. Pt could benefit from continued skilled therapy at this time to improve deficits toward PLOF. PT will continue to work with pt at least 2x/week while admitted. D/c recommendations continue to be home with HHPT and 24 hour assistance.    Follow Up Recommendations  Home health PT;Supervision/Assistance - 24 hour     Equipment Recommendations  Rolling walker with 5" wheels    Recommendations for Other Services       Precautions / Restrictions Precautions Precautions: None Restrictions Weight Bearing Restrictions: No    Mobility  Bed Mobility               General bed mobility comments: Not assessed this visit. Pt up in chair upon arrival and prefers to stay up upon  completion  Transfers Overall transfer level: Needs assistance Equipment used: Rolling walker (2 wheeled) Transfers: Sit to/from Stand Sit to Stand: Min guard         General transfer comment: Pt CGA to transfer sit<>stand multiple times throughout session demonstrating safety with RW and UE use standing and good eccentric control stand>sit  Ambulation/Gait Ambulation/Gait assistance: Min guard Gait Distance (Feet): 25 Feet Assistive device: Rolling walker (2 wheeled) Gait Pattern/deviations: Step-through pattern;Decreased step length - right;Decreased step length - left     General Gait Details: Pt amb 25' x2 with CGA and RW. Pt requires one seated rest break between due to fatigue however quickly recovers and is eager to amb further   Stairs             Wheelchair Mobility    Modified Rankin (Stroke Patients Only)       Balance                                            Cognition Arousal/Alertness: Awake/alert Behavior During Therapy: WFL for tasks assessed/performed Overall Cognitive Status: Within Functional Limits for tasks assessed                                 General Comments: A & O x4      Exercises  Other Exercises Other Exercises: Pt instructed in and tolerates well B LE ther-ex including SLR, LAQ, seated and standing marching x15 reps each. Pt does fatigue with ther-ex and requires intermittent rest breaks    General Comments        Pertinent Vitals/Pain Pain Assessment: No/denies pain Pain Score: 0-No pain    Home Living                      Prior Function            PT Goals (current goals can now be found in the care plan section) Acute Rehab PT Goals Patient Stated Goal: to go home PT Goal Formulation: With patient Time For Goal Achievement: 29-Mar-2018 Potential to Achieve Goals: Fair Progress towards PT goals: Progressing toward goals    Frequency    Min 2X/week      PT Plan  Current plan remains appropriate    Co-evaluation              AM-PAC PT "6 Clicks" Daily Activity  Outcome Measure  Difficulty turning over in bed (including adjusting bedclothes, sheets and blankets)?: A Little Difficulty moving from lying on back to sitting on the side of the bed? : A Little Difficulty sitting down on and standing up from a chair with arms (e.g., wheelchair, bedside commode, etc,.)?: Unable Help needed moving to and from a bed to chair (including a wheelchair)?: A Little Help needed walking in hospital room?: A Little Help needed climbing 3-5 steps with a railing? : A Lot 6 Click Score: 15    End of Session Equipment Utilized During Treatment: Gait belt Activity Tolerance: Patient tolerated treatment well;Patient limited by fatigue Patient left: in chair;with call bell/phone within reach;with chair alarm set;with family/visitor present Nurse Communication: Mobility status;Other (comment)(O2) PT Visit Diagnosis: Unsteadiness on feet (R26.81);Other abnormalities of gait and mobility (R26.89);Muscle weakness (generalized) (M62.81);Difficulty in walking, not elsewhere classified (R26.2);Pain     Time: 4008-6761 PT Time Calculation (min) (ACUTE ONLY): 18 min  Charges:  $Therapeutic Exercise: 8-22 mins                     [employer], SPT    Tad Fancher 03/04/2018, 10:45 AM

## 2018-03-04 NOTE — Care Management Important Message (Signed)
Important Message  Patient Details  Name: Dana Ramirez MRN: [id number] Date of Birth: Aug 18, 1950   Medicare Important Message Given:  Yes    Juliann Pulse A Isa Hitz 03/04/2018, 11:30 AM

## 2018-03-04 NOTE — Progress Notes (Signed)
Hematology/Oncology Progress Note [facility] Telephone:([phone] [phone]  Patient Care Team: Crecencio Mc, MD as PCP - General (Internal Medicine) Minna Merritts, MD (Cardiology) Telford Nab, RN as Registered Nurse Bary Castilla, Forest Gleason, MD (General Surgery) Earlie Server, MD as Medical Oncologist (Medical Oncology)   Name of the patient: Dana Ramirez  [id number]  09/15/1950  Date of visit: 03/04/18   INTERVAL HISTORY-  No acute overnight events.  Patient reports feeling better today.  Flank pain has resolved.  She participated physical therapy and oxygen maintained above 88%.  Appetite is also slightly better. No more fever episodes.  Review of systems- Review of Systems  Constitutional: Positive for malaise/fatigue. Negative for chills, fever and weight loss.  HENT: Negative for nosebleeds and sore throat.   Eyes: Negative for double vision, photophobia and redness.  Respiratory: Positive for cough and shortness of breath. Negative for wheezing.   Cardiovascular: Negative for chest pain, palpitations and orthopnea.  Gastrointestinal: Negative for abdominal pain, blood in stool, heartburn, nausea and vomiting.  Genitourinary: Negative for dysuria.  Musculoskeletal: Negative for back pain, myalgias and neck pain.  Skin: Negative for itching and rash.  Neurological: Positive for weakness. Negative for dizziness, tingling and tremors.  Endo/Heme/Allergies: Negative for environmental allergies. Does not bruise/bleed easily.  Psychiatric/Behavioral: Negative for depression and suicidal ideas.    Allergies  Allergen Reactions  . Codeine Nausea And Vomiting  . Lisinopril Cough  . Penicillins Itching    Has patient had a PCN reaction causing immediate rash, facial/tongue/throat swelling, SOB or lightheadedness with hypotension: no Has patient had a PCN reaction causing severe rash involving mucus membranes or skin necrosis: no Has patient  had a PCN reaction that required hospitalization: no Has patient had a PCN reaction occurring within the last 10 years: no If all of the above answers are "NO", then may proceed with Cephalosporin use.     Patient Active Problem List   Diagnosis Date Noted  . AKI (acute kidney injury) ([place])   . Hydronephrosis, left   . Pneumonia 03/01/2018  . Supraclavicular lymphadenopathy 01/26/2018  . Goals of care, counseling/discussion 01/07/2018  . Lung cancer ([place]) 01/06/2018  . Malignant neoplasm of overlapping sites of right lung ([place]) 12/28/2017  . Wheezing 12/21/2017  . Mediastinal lymphadenopathy 12/21/2017  . Neck mass 12/08/2017  . Cervical radiculopathy at C8 11/06/2017  . GERD (gastroesophageal reflux disease) 04/10/2017  . 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 B12 deficiency 04/02/2016  . Cough in adult patient 12/28/2015  . Carpal tunnel syndrome 06/03/2015  . Atherosclerosis of coronary artery 06/03/2015  . Hypertension 06/03/2015  . Subclinical hypothyroidism 06/03/2015  . Diabetes mellitus without complication ([place]) 25/36/6440  . Carotid stenosis 12/21/2012  . Hyperlipidemia 12/19/2009  . Atherosclerosis of native coronary artery of native heart with stable angina pectoris ([place]) 12/19/2009     Past Medical History:  Diagnosis Date  . CAD (coronary artery disease)   . Carotid arterial disease ([place])   . Diabetes mellitus without complication ([place])   . History of vertigo   . Hyperlipidemia   . Hypertension   . Kidney stone      Past Surgical History:  Procedure Laterality Date  . BREAST CYST ASPIRATION Left 1980s  . CESAREAN SECTION     x 2  . CHOLECYSTECTOMY    . CORONARY ARTERY BYPASS GRAFT  03/2005   LIMA to the LAD, vein graft to diagonal with a negative stress test in 06/2006  . PORTACATH PLACEMENT  Left 12/31/2017   Procedure: INSERTION PORT-A-CATH;  Surgeon: Robert Bellow, MD;  Location: [facility] ORS;  Service: General;  Laterality: Left;  . SENTINEL NODE BIOPSY  Right 12/31/2017   Procedure: SUPRACLAVICULAR NODE BIOPSY;  Surgeon: Robert Bellow, MD;  Location: [facility] ORS;  Service: General;  Laterality: Right;  . TONSILLECTOMY      Social History   Socioeconomic History  . Marital status: Married    Spouse name: Elenore Rota   . Number of children: 2  . Years of education: Not on file  . Highest education level: Not on file  Occupational History  . Occupation: Retired     Fish farm manager: OTHER    Comment: [employer]  Social Needs  . Financial resource strain: Not on file  . Food insecurity:    Worry: Not on file    Inability: Not on file  . Transportation needs:    Medical: Not on file    Non-medical: Not on file  Tobacco Use  . Smoking status: Never Smoker  . Smokeless tobacco: Never Used  Substance and Sexual Activity  . Alcohol use: No  . Drug use: No  . Sexual activity: Not on file  Lifestyle  . Physical activity:    Days per week: Not on file    Minutes per session: Not on file  . Stress: Not on file  Relationships  . Social connections:    Talks on phone: Not on file    Gets together: Not on file    Attends religious service: Not on file    Active member of club or organization: Not on file    Attends meetings of clubs or organizations: Not on file    Relationship status: Not on file  . Intimate partner violence:    Fear of current or ex partner: Not on file    Emotionally abused: Not on file    Physically abused: Not on file    Forced sexual activity: Not on file  Other Topics Concern  . Not on file  Social History Narrative   Married   Gets regular exercise, once or twice every week     Family History  Problem Relation Age of Onset  . Heart disease Mother   . Heart disease Father 2  . Stroke Sister 67  . Heart disease Sister   . Diabetes Brother   . Coronary artery disease Other        Strong family history of CAD and CABG  . Heart disease Maternal Grandmother   . Breast cancer Maternal Grandmother   . Heart  disease Maternal Grandfather   . Heart disease Paternal Grandmother   . Breast cancer Paternal Grandmother 2  . Heart disease Paternal Grandfather      Current Facility-Administered Medications:  .  acetaminophen (TYLENOL) tablet 650 mg, 650 mg, Oral, Q6H PRN, Vaughan Basta, MD, 650 mg at 03/03/18 1735 .  albuterol (PROVENTIL) (2.5 MG/3ML) 0.083% nebulizer solution 3 mL, 3 mL, Inhalation, Q6H PRN, Vaughan Basta, MD, 3 mL at 03/03/18 1345 .  aspirin EC tablet 81 mg, 81 mg, Oral, BID, Vaughan Basta, MD, 81 mg at 03/04/18 0908 .  benzonatate (TESSALON) capsule 200 mg, 200 mg, Oral, Q6H PRN, Pyreddy, Pavan, MD, 200 mg at 03/03/18 1047 .  ceFEPIme (MAXIPIME) 2 g in sodium chloride 0.9 % 100 mL IVPB, 2 g, Intravenous, Q12H, Hallaji, Sheema M, RPH, Stopped at 03/04/18 1140 .  chlorpheniramine-HYDROcodone (TUSSIONEX) 10-8 MG/5ML suspension 5 mL, 5 mL, Oral, Q12H PRN, Anselm Jungling,  Rosalio Macadamia, MD, 5 mL at 03/02/18 1219 .  cholecalciferol (VITAMIN D) tablet 1,000 Units, 1,000 Units, Oral, Daily, Vaughan Basta, MD, 1,000 Units at 03/04/18 0908 .  docusate sodium (COLACE) capsule 100 mg, 100 mg, Oral, BID PRN, Vaughan Basta, MD .  docusate sodium (COLACE) capsule 100 mg, 100 mg, Oral, BID, Vaughan Basta, MD, 100 mg at 03/04/18 0907 .  enoxaparin (LOVENOX) injection 40 mg, 40 mg, Subcutaneous, Q24H, Pyreddy, Pavan, MD, 40 mg at 03/03/18 1739 .  insulin aspart (novoLOG) injection 0-9 Units, 0-9 Units, Subcutaneous, TID AC & HS, Lance Coon, MD, 5 Units at 03/04/18 1308 .  ipratropium-albuterol (DUONEB) 0.5-2.5 (3) MG/3ML nebulizer solution 3 mL, 3 mL, Nebulization, Once, Sridharan, Prasanna, MD .  ketorolac (TORADOL) 15 MG/ML injection 15 mg, 15 mg, Intravenous, Q6H PRN, Verdell Carmine, Belia Heman, MD .  LORazepam (ATIVAN) tablet 0.5 mg, 0.5 mg, Oral, Q8H PRN, Vaughan Basta, MD, 0.5 mg at 03/01/18 2021 .  MEDLINE mouth rinse, 15 mL, Mouth Rinse, BID,  Sainani, Belia Heman, MD, 15 mL at 03/04/18 1110 .  metoprolol succinate (TOPROL-XL) 24 hr tablet 25 mg, 25 mg, Oral, Daily, Vaughan Basta, MD, 25 mg at 03/04/18 0908 .  morphine 2 MG/ML injection 2 mg, 2 mg, Intravenous, Q4H PRN, Pyreddy, Pavan, MD .  ondansetron (ZOFRAN) injection 4 mg, 4 mg, Intravenous, Q6H PRN, Vaughan Basta, MD, 4 mg at 03/02/18 1219 .  pantoprazole (PROTONIX) EC tablet 40 mg, 40 mg, Oral, Daily, Vaughan Basta, MD, 40 mg at 03/04/18 0908 .  predniSONE (DELTASONE) tablet 5 mg, 5 mg, Oral, Q breakfast, Vaughan Basta, MD, 5 mg at 03/04/18 0908 .  rosuvastatin (CRESTOR) tablet 20 mg, 20 mg, Oral, QPM, Vaughan Basta, MD, 20 mg at 03/03/18 1739 .  traMADol (ULTRAM) tablet 50 mg, 50 mg, Oral, Q6H PRN, Pyreddy, Pavan, MD, 50 mg at 03/04/18 1104 .  vitamin B-12 (CYANOCOBALAMIN) tablet 1,000 mcg, 1,000 mcg, Oral, Daily, Vaughan Basta, MD, 1,000 mcg at 03/04/18 0908   Physical exam:  Vitals:   03/03/18 2108 03/04/18 0527 03/04/18 0852 03/04/18 1331  BP: 115/73 109/68 126/73 112/85  Pulse: 93 93 (!) 120 (!) 123  Resp:   20 16  Temp: 97.7 F (36.5 C) 98 F (36.7 C) 97.6 F (36.4 C) 98.1 F (36.7 C)  TempSrc: Oral Oral Oral Oral  SpO2: 100% 100% 99% 91%  Weight:      Height:       Physical Exam  Constitutional: She is oriented to person, place, and time and well-developed, well-nourished, and in no distress. No distress.  HENT:  Head: Normocephalic and atraumatic.  Mouth/Throat: No oropharyngeal exudate.  Eyes: Pupils are equal, round, and reactive to light. EOM are normal. Left eye exhibits no discharge. No scleral icterus.  Neck: Normal range of motion. Neck supple.  Cardiovascular: Normal rate and regular rhythm.  No murmur heard. Pulmonary/Chest: Effort normal. She has no wheezes. She has no rales.  Abdominal: Soft. Bowel sounds are normal. She exhibits no distension and no mass. There is no tenderness.    Musculoskeletal: Normal range of motion. She exhibits edema.  Neurological: She is alert and oriented to person, place, and time. No cranial nerve deficit. She exhibits normal muscle tone. Coordination normal.  Skin: Skin is warm and dry. She is not diaphoretic. No erythema.  Psychiatric: Affect normal.       CMP Latest Ref Rng & Units 03/04/2018  Glucose 70 - 99 mg/dL 187(H)  BUN 8 - 23 mg/dL 19  Creatinine  0.44 - 1.00 mg/dL 1.25(H)  Sodium 135 - 145 mmol/L 133(L)  Potassium 3.5 - 5.1 mmol/L 4.6  Chloride 98 - 111 mmol/L 107  CO2 22 - 32 mmol/L 21(L)  Calcium 8.9 - 10.3 mg/dL 7.5(L)  Total Protein 6.5 - 8.1 g/dL -  Total Bilirubin 0.3 - 1.2 mg/dL -  Alkaline Phos 38 - 126 U/L -  AST 15 - 41 U/L -  ALT 0 - 44 U/L -   CBC Latest Ref Rng & Units 03/04/2018  WBC 3.6 - 11.0 K/uL 13.5(H)  Hemoglobin 12.0 - 16.0 g/dL 8.7(L)  Hematocrit 35.0 - 47.0 % 25.5(L)  Platelets 150 - 440 K/uL 171   RADIOGRAPHIC STUDIES: I have personally reviewed the radiological images as listed and agreed with the findings in the report. Ct Abdomen Pelvis Wo Contrast  Result Date: 03/01/2018 CLINICAL DATA:  History of non small cell lung carcinoma with fevers and nausea EXAM: CT ABDOMEN AND PELVIS WITHOUT CONTRAST TECHNIQUE: Multidetector CT imaging of the abdomen and pelvis was performed following the standard protocol without IV contrast. COMPARISON:  12/24/2017 PET-CT FINDINGS: Lower chest: Chronic changes are noted in the right lower lobe stable from the prior PET-CT is well as a CT examination from 12/29/2014. Some slight increased interstitial changes are noted which may be related to lymphangitic spread of carcinoma. The left lung base is within normal limits. Along the inferior aspect of the right hemidiaphragm posteriorly there is a 1.8 cm soft tissue nodule best seen on image number 21 of series 2. When compare with the prior PET-CT this was not present and likely represents a metastatic focus.  Hepatobiliary: No focal liver abnormality is seen. Status post cholecystectomy. No biliary dilatation. Pancreas: Unremarkable. No pancreatic ductal dilatation or surrounding inflammatory changes. Spleen: Normal in size without focal abnormality. Adrenals/Urinary Tract: Adrenal glands are within normal limits. Bladder is decompressed. Kidneys are well visualized bilaterally. Some mild hydronephrotic changes are noted on the left which extend inferiorly into the mid ureter. No definitive stone is seen although some soft tissue thickening of the ureter is noted. Possibility of underlying mass lesion cannot be totally excluded. The bladder is partially distended. Stomach/Bowel: Scattered diverticular change of the colon is noted without evidence of diverticulitis. The appendix is within normal limits. No small bowel abnormality is seen. Vascular/Lymphatic: Aortic atherosclerosis. No enlarged abdominal or pelvic lymph nodes. Reproductive: Uterus and bilateral adnexa are unremarkable. Other: No abdominal wall hernia or abnormality. No abdominopelvic ascites. Musculoskeletal: Degenerative changes of the lumbar spine are seen. IMPRESSION: Changes in the right lung base predominately chronic in nature although some changes consistent with lymphangitic spread of carcinoma are present. These are new from the prior PET-CT. Nodule along the right hemidiaphragm posteriorly likely representing a metastatic deposit. Obstructive changes of the left renal collecting system and proximal ureter. Some thickening of the ureter is seen which may represent a focal lesion. Clinical correlation is recommended. Although not mentioned in the body of the report there are 2 peritoneal soft tissue lesions identified. The largest of these lies on image number 49 of series 2 measuring approximately 18 mm in dimension. This may also represent some metastatic disease. Repeat PET-CT when the patient's condition improves may be helpful. Electronically  Signed   By: Inez Catalina M.D.   On: 03/01/2018 21:12   Ct Chest Wo Contrast  Result Date: 03/01/2018 CLINICAL DATA:  Cough and shortness of breath beginning last night. The patient is undergoing chemotherapy and radiation therapy for lung carcinoma. EXAM:  CT CHEST WITHOUT CONTRAST TECHNIQUE: Multidetector CT imaging of the chest was performed following the standard protocol without IV contrast. COMPARISON:  PET CT scan 12/24/2017. FINDINGS: Cardiovascular: The patient is status post CABG. Heart size is normal. Calcific aortic atherosclerosis is identified. No pericardial effusion. Mediastinum/Nodes: Previously seen 1.5 cm short axis dimension right supraclavicular node measures 0.8 cm on image 5 of series 2 today. Right suprahilar mass seen on the prior examination measures 4.4 x 2.7 cm today on image 32 compared to 4.9 x 3.6 cm on the prior study (remeasurement). The lesion abuts the right subclavian artery as on the prior exam. A right infrahilar mass lesion encasing the right mainstem bronchus measures 3.9 x 3.9 cm on image 67 compared to 4.3 x 4.3 cm on the prior exam. There is less mass effect on the right mainstem bronchus. The thyroid gland and esophagus are unremarkable. Lungs/Pleura: No pleural effusion. Thickened pleura and pleural calcification in the right lung base are unchanged. The lungs are emphysematous. Increased airspace opacity in the posterior right lower lobe since the prior exam is seen medially and worrisome for pneumonia. Scattered, patchy areas of ground-glass attenuation throughout the right lung are not grossly changed. The left lung is clear. Upper Abdomen: A soft tissue nodule measuring 1.8 x 1.6 cm on image 125 posterior to the medial limb of the right adrenal gland is new since the prior study. Also seen is new fullness of the visualized left intrarenal collecting system which is new since the prior exam. Musculoskeletal: No lytic or sclerotic lesion is identified. IMPRESSION:  Some increase in airspace opacity in the right lower lobe worrisome for pneumonia. Lung carcinoma with metastatic lymphadenopathy appears improved as described above. Visualized left intrarenal collecting system is dilated worrisome for hydronephrosis, new since the prior examination. Cause for this finding is not identified. Renal ultrasound is recommended for further evaluation. New soft tissue nodule along the medial aspect of the posterior right hemidiaphragm is worrisome for metastatic disease. Aortic Atherosclerosis (ICD10-I70.0) and Emphysema (ICD10-J43.9). Electronically Signed   By: Inge Dana M.D.   On: 03/01/2018 12:37   US Renal  Result Date: 03/02/2018 CLINICAL DATA:  67 year old female with LEFT hydronephrosis. History of RIGHT lung cancer. EXAM: RENAL / URINARY TRACT ULTRASOUND COMPLETE COMPARISON:  03/01/2018 CT and prior studies FINDINGS: Right Kidney: Length: 10.7 cm. Echogenicity within normal limits. No mass or hydronephrosis visualized. Left Kidney: Length: 12 cm. Mild LEFT hydronephrosis appears decreased from recent CT. Echogenicity within normal limits. No solid mass visualized. Bladder: Appears normal for degree of bladder distention. IMPRESSION: 1. Mild LEFT hydronephrosis, which appears decreased from 03/01/2018 CT. 2. No other significant abnormalities. Electronically Signed   By: Margarette Canada M.D.   On: 03/02/2018 10:46    Assessment and plan- Patient is a 67 y.o. female with history of CAD, diabetes, hyperlipidemia, hypertension kidney stone recent diagnosis of high-grade non-small cell lung cancer on chemo plus radiation, presents with cough, fever, acute kidney failure  #Pneumonia, clinically improving.  Afebrile.  Continue IV antibiotics.  On discharge be switched to oral antibiotics. #High-grade carcinoma of lung, Patient has high expression of PDL 1 TPS as 90%, recommend monotherapy with [facility] outpatient.   Patient has an appointment with me on 03/08/2018 8:45 AM  to start immunotherapy.  #AKI, secondary to obstruction changes.  Likely metastatic cancer.  Kidney function stable continue to monitor.    Thank you for allowing me to participate in the care of this patient.  Total face to face  encounter time for this patient visit was 25 min. >50% of the time was  spent in counseling and coordination of care.   Earlie Server, MD, PhD Hematology Oncology [facility] at [facility] Pager- [phone] 03/04/2018

## 2018-03-04 NOTE — Consult Note (Signed)
Pharmacy Antibiotic Note  Dana Ramirez is a 67 y.o. female admitted on 03/01/2018 with pneumonia.  Pharmacy has been consulted for Vancomycin and Cefepime dosing.  Plan: Continue cefepime 2g IV every 12 hours.   Height: 5\' 3"  (160 cm) Weight: 145 lb (65.8 kg) IBW/kg (Calculated) : 52.4  Temp (24hrs), Avg:97.9 F (36.6 C), Min:97.6 F (36.4 C), Max:98.1 F (36.7 C)  Recent Labs  Lab 03/01/18 0915 03/01/18 2029 03/01/18 2322 03/02/18 0436 03/03/18 0447 03/04/18 0444  WBC 9.6  --   --  10.3 9.6 13.5*  CREATININE 1.13*  --   --  1.38* 1.21* 1.25*  LATICACIDVEN  --  1.7 2.7* 1.4  --   --     Estimated Creatinine Clearance: 40.9 mL/min (A) (by C-G formula based on SCr of 1.25 mg/dL (H)).    Allergies  Allergen Reactions  . Codeine Nausea And Vomiting  . Lisinopril Cough  . Penicillins Itching    Has patient had a PCN reaction causing immediate rash, facial/tongue/throat swelling, SOB or lightheadedness with hypotension: no Has patient had a PCN reaction causing severe rash involving mucus membranes or skin necrosis: no Has patient had a PCN reaction that required hospitalization: no Has patient had a PCN reaction occurring within the last 10 years: no If all of the above answers are "NO", then may proceed with Cephalosporin use.     Antimicrobials this admission: 8/13 Vancomycin  >> 8/14 8/13 Cefepime >>   Microbiology results: 8/13 BCx: NGTD 8/13 MRSA PCR: negative  Thank you for allowing pharmacy to be a part of this patient's care.  Rocky Morel, PharmD, BCPS Clinical Pharmacist 03/04/2018 5:03 PM

## 2018-03-04 NOTE — Evaluation (Signed)
Occupational Therapy Evaluation Patient Details Name: Dana Ramirez MRN: [id number] DOB: 03/17/1951 Today's Date: 03/04/2018    History of Present Illness Pt presents to hospital on 03/01/18 for complaints of weakness and SOB. Pt subsequently diagnosed with pneumonia, AKI, and hydonephrosis. Pt is currently undergoing chemotherapy for non small cell lung cancer. Pts other PMH includes CAD, DM, vertigo, HLD, and kidney stones   Clinical Impression   Pt seen for OT evaluation this date. Pt was independent in all ADLs and mobility, living in a 1 story home with her spouse and good family support/assist from daughters and others. Pt reports recently becoming easily fatigued or out of breath with minimize exertion. Pt currently requires minimal assist for LB ADL and CGA for ambulation with RW for ADL tasks due to poor activity tolerance. Pt on RA t/o session, O2 sats >93% and improves with use of learned pursed lip breathing. Pt/family educated in energy conservation conservation strategies including pursed lip breathing, activity pacing, home/routines modifications, work simplification, AE/DME, prioritizing of meaningful occupations, and falls prevention. Handout provided. Pt/family verbalized understanding but would benefit from additional skilled OT services to maximize recall and carryover of learned techniques and facilitate implementation of learned techniques into daily routines. Upon discharge, recommend HHOT services to assess home environment and implementation of energy conservation strategies.    Follow Up Recommendations  Home health OT    Equipment Recommendations  Tub/shower bench;Toilet rise with handles    Recommendations for Other Services       Precautions / Restrictions Precautions Precautions: None Restrictions Weight Bearing Restrictions: No      Mobility Bed Mobility               General bed mobility comments: deferred, pt up in recliner at start and end  of session  Transfers Overall transfer level: Needs assistance Equipment used: Rolling walker (2 wheeled) Transfers: Sit to/from Stand Sit to Stand: Min guard         General transfer comment: Pt CGA to transfer sit<>stand multiple times throughout session demonstrating safety with RW and UE use standing and good eccentric control stand>sit    Balance Overall balance assessment: Needs assistance Sitting-balance support: No upper extremity supported;Feet unsupported Sitting balance-Leahy Scale: Good       Standing balance-Leahy Scale: Fair                             ADL either performed or assessed with clinical judgement   ADL Overall ADL's : Needs assistance/impaired Eating/Feeding: Sitting;Independent   Grooming: Sitting;Independent   Upper Body Bathing: Sitting;Minimal assistance   Lower Body Bathing: Sit to/from stand;Minimal assistance Lower Body Bathing Details (indicate cue type and reason): pt/family instructed in seated bathing to maximize energy conservation and minimize falls Upper Body Dressing : Sitting;Minimal assistance   Lower Body Dressing: Sit to/from stand;Minimal assistance   Toilet Transfer: RW;Ambulation;Comfort height toilet;Min guard                   Vision Baseline Vision/History: Wears glasses Wears Glasses: Reading only Patient Visual Report: No change from baseline       Perception     Praxis      Pertinent Vitals/Pain Pain Assessment: No/denies pain Pain Score: 0-No pain Pain Intervention(s): Monitored during session     Hand Dominance     Extremity/Trunk Assessment Upper Extremity Assessment Upper Extremity Assessment: Generalized weakness   Lower Extremity Assessment Lower Extremity Assessment: Generalized weakness  Cervical / Trunk Assessment Cervical / Trunk Assessment: Normal   Communication Communication Communication: No difficulties   Cognition Arousal/Alertness: Awake/alert Behavior  During Therapy: WFL for tasks assessed/performed Overall Cognitive Status: Within Functional Limits for tasks assessed                                 General Comments: A & O x4   General Comments       Exercises Exercises: Other exercises Other Exercises Other Exercises: Pt/family educated in energy conservation strategies including pursed lip breathing, activity pacing, prioritizing meaningful occupations, home/routines modifications, and work simplification as well as falls prevention strategies   Shoulder Instructions      Home Living Family/patient expects to be discharged to:: Private residence Living Arrangements: Spouse/significant other Available Help at Discharge: Family;Available 24 hours/day(Husband and daughters available 24/7) Type of Home: House Home Access: Stairs to enter [employer] of Steps: 4 Entrance Stairs-Rails: Left Home Layout: One level     Bathroom Shower/Tub: Tub/shower unit;Curtain   Biochemist, clinical: Standard     Home Equipment: Cane - quad          Prior Functioning/Environment Level of Independence: Independent        Comments: Pt states that she was independent without an AD and was able to amb in the community as needed up until Tuesday 03/01/18.         OT Problem List: Decreased strength;Decreased knowledge of use of DME or AE;Decreased activity tolerance;Cardiopulmonary status limiting activity      OT Treatment/Interventions: Self-care/ADL training;Therapeutic exercise;Therapeutic activities;Energy conservation;DME and/or AE instruction;Patient/family education    OT Goals(Current goals can be found in the care plan section) Acute Rehab OT Goals Patient Stated Goal: to go home and get back to normal routine OT Goal Formulation: With patient/family Time For Goal Achievement: 03/18/18 Potential to Achieve Goals: Good ADL Goals Pt Will Perform Lower Body Dressing: with modified independence;with  adaptive equipment;sit to/from stand(with no SOB, O2 sats >93% on RA) Additional ADL Goal #1: Pt will incorporated pursed lip breathing into ADL and functional mobility tasks to minimize SOB during exertion with O2 sats on RA >93%. Additional ADL Goal #2: Pt will verbalize plan to implement at least 1 learned ECS into daily routine to minimize falls and maximize safety.  OT Frequency: Min 1X/week   Barriers to D/C:            Co-evaluation              AM-PAC PT "6 Clicks" Daily Activity     Outcome Measure Help from another person eating meals?: None Help from another person taking care of personal grooming?: None Help from another person toileting, which includes using toliet, bedpan, or urinal?: A Little Help from another person bathing (including washing, rinsing, drying)?: A Little Help from another person to put on and taking off regular upper body clothing?: None Help from another person to put on and taking off regular lower body clothing?: A Little 6 Click Score: 21   End of Session    Activity Tolerance: Patient tolerated treatment well Patient left: in chair;with call bell/phone within reach;with family/visitor present  OT Visit Diagnosis: Other abnormalities of gait and mobility (R26.89);Muscle weakness (generalized) (M62.81)                Time: 3976-7341 OT Time Calculation (min): 18 min Charges:  OT General Charges $OT Visit: 1 Visit OT Evaluation $OT  Eval Low Complexity: 1 Low OT Treatments $Self Care/Home Management : 8-22 mins  Jeni Salles, MPH, MS, OTR/L ascom [phone] 03/04/18, 11:19 AM

## 2018-03-04 NOTE — Progress Notes (Signed)
[place] [facility] [place] [facility] [place] NAME: Dana Ramirez    MR#:  [id number]  DATE OF BIRTH:  1951-07-19  SUBJECTIVE:   Renal function improved since yesterday.  Her appetite is better and she feels overall better.  Patient's daughter is at bedside.  Plan for possible discharge tomorrow with home health services.  REVIEW OF SYSTEMS:    Review of Systems  Constitutional: Negative for chills and fever.  HENT: Negative for congestion and tinnitus.   Eyes: Negative for blurred vision and double vision.  Respiratory: Positive for shortness of breath (improved. ). Negative for cough and wheezing.   Cardiovascular: Negative for chest pain, orthopnea and PND.  Gastrointestinal: Negative for abdominal pain, diarrhea, nausea and vomiting.  Genitourinary: Negative for dysuria and hematuria.  Neurological: Positive for weakness (Generalized. ). Negative for dizziness, sensory change and focal weakness.  All other systems reviewed and are negative.   Nutrition: Heart Healthy/Carb control Tolerating Diet: Yes Tolerating PT: Await Eval.   DRUG ALLERGIES:   Allergies  Allergen Reactions  . Codeine Nausea And Vomiting  . Lisinopril Cough  . Penicillins Itching    Has patient had a PCN reaction causing immediate rash, facial/tongue/throat swelling, SOB or lightheadedness with hypotension: no Has patient had a PCN reaction causing severe rash involving mucus membranes or skin necrosis: no Has patient had a PCN reaction that required hospitalization: no Has patient had a PCN reaction occurring within the last 10 years: no If all of the above answers are "NO", then may proceed with Cephalosporin use.     VITALS:  Blood pressure 112/85, pulse (!) 123, temperature 98.1 F (36.7 C), temperature source Oral, resp. rate 16, height 5\' 3"  (1.6 m), weight 65.8 kg, SpO2 91 %.  PHYSICAL EXAMINATION:   Physical Exam  GENERAL:  67 y.o.-year-old patient lying in bed in  NAD.    EYES: Pupils equal, round, reactive to light and accommodation. No scleral icterus. Extraocular muscles intact.  HEENT: Head atraumatic, normocephalic. Oropharynx and nasopharynx clear.  NECK:  Supple, no jugular venous distention. No thyroid enlargement, no tenderness.  LUNGS: Poor Resp. effort, no wheezing, rales, rhonchi. No use of accessory muscles of respiration.  CARDIOVASCULAR: S1, S2 normal. No murmurs, rubs, or gallops.  ABDOMEN: Soft, nontender, nondistended. Bowel sounds present. No organomegaly or mass.  EXTREMITIES: No cyanosis, clubbing or edema b/l.    NEUROLOGIC: Cranial nerves II through XII are intact. No focal Motor or sensory deficits b/l.  Globally weak.  PSYCHIATRIC: The patient is alert and oriented x 3.  SKIN: No obvious rash, lesion, or ulcer.    LABORATORY PANEL:   CBC Recent Labs  Lab 03/04/18 0444  WBC 13.5*  HGB 8.7*  HCT 25.5*  PLT 171   ------------------------------------------------------------------------------------------------------------------  Chemistries  Recent Labs  Lab 03/01/18 0915  03/04/18 0444  NA 131*   < > 133*  K 3.8   < > 4.6  CL 96*   < > 107  CO2 22   < > 21*  GLUCOSE 321*   < > 187*  BUN 24*   < > 19  CREATININE 1.13*   < > 1.25*  CALCIUM 9.2   < > 7.5*  AST 19  --   --   ALT 17  --   --   ALKPHOS 87  --   --   BILITOT 0.5  --   --    < > = values in this interval not displayed.   ------------------------------------------------------------------------------------------------------------------  Cardiac Enzymes No results for input(s): TROPONINI in the last 168 hours. ------------------------------------------------------------------------------------------------------------------  RADIOLOGY:  No results found.   ASSESSMENT AND PLAN:   67 year old female with past medical history of non-small cell lung cancer, diabetes, hypertension, hyperlipidemia, history of nephrolithiasis, carotid artery disease  who presented to the hospital due to shortness of breath, chest pain and cough.  1.  Pneumonia-suspected to be healthcare associated pneumonia. -Much improved since yesterday.  Patient is afebrile, hemodynamically stable, shortness of breath is improved. -Continue IV cefepime and will switch to oral antibiotics tomorrow upon discharge.  2. Sepsis- suspected to be secondary to pneumonia. -Continue IV cefepime as mentioned above.  Follow cultures which are currently negative.    3.  Left flank pain/hydronephrosis- patient had some mild left flank pain and ultrasound showed mild left hydronephrosis.  Seen by urology and given mild acute kidney injury they were considering stent versus percutaneous nephrostomy tube, although patient's creatinine has improved and she has no acute abdominal pain and is urinating well and therefore no plans for intervention.  Continue supportive care for now. - Patient to have repeat ultrasound done in the next 2 weeks.  4.  Acute kidney injury- secondary to dehydration/nephrolithiasis/ intrinsic compression from underlying malignancy. -Improved with IV fluids, seen by urology no plans for intervention as mentioned above.   - Creatinine improving with supportive care we will continue to monitor.  5.  High Grade non-small cell lung cancer with recurrence-seen by oncology.  Patient is status post chemoradiation but has now worsening recurrence.   Patient's case discussed at tumor board, discussed with oncologist and plan on starting [place] as an outpatient next week once patient recovers from her pneumonia.  6.  Essential hypertension-continue Toprol.  7.  Hyperlipidemia-continue Crestor.  8.  Anxiety-continue Ativan as needed.  She had palliative care input and patient is a DNR now.  Likely discharge home tomorrow with home health services.  All the records are reviewed and case discussed with Care Management/Social Worker. Management plans discussed with the  patient, family and they are in agreement.  CODE STATUS: DNR  DVT Prophylaxis: Lovenox  TOTAL TIME TAKING CARE OF THIS PATIENT: 30 minutes.   POSSIBLE D/C IN 2-3 DAYS, DEPENDING ON CLINICAL CONDITION.   Henreitta Leber M.D on 03/04/2018 at 1:48 PM  Between 7am to 6pm - Pager - [phone]  After 6pm go to [email or web address] - Proofreader  [facility]  Office  [phone]  CC: Primary care physician; Crecencio Mc, MD

## 2018-03-05 ENCOUNTER — Inpatient Hospital Stay: Payer: Medicare Other

## 2018-03-05 DIAGNOSIS — K921 Melena: Secondary | ICD-10-CM

## 2018-03-05 LAB — HEPARIN LEVEL (UNFRACTIONATED)
HEPARIN UNFRACTIONATED: 0.1 [IU]/mL — AB (ref 0.30–0.70)
HEPARIN UNFRACTIONATED: 0.46 [IU]/mL (ref 0.30–0.70)

## 2018-03-05 LAB — PROTIME-INR
INR: 1.12
Prothrombin Time: 14.3 seconds (ref 11.4–15.2)

## 2018-03-05 LAB — CBC
HCT: 25.4 % — ABNORMAL LOW (ref 35.0–47.0)
Hemoglobin: 8.8 g/dL — ABNORMAL LOW (ref 12.0–16.0)
MCH: 32.6 pg (ref 26.0–34.0)
MCHC: 34.6 g/dL (ref 32.0–36.0)
MCV: 94 fL (ref 80.0–100.0)
PLATELETS: 145 10*3/uL — AB (ref 150–440)
RBC: 2.71 MIL/uL — ABNORMAL LOW (ref 3.80–5.20)
RDW: 19.6 % — AB (ref 11.5–14.5)
WBC: 7.6 10*3/uL (ref 3.6–11.0)

## 2018-03-05 LAB — GLUCOSE, CAPILLARY
GLUCOSE-CAPILLARY: 243 mg/dL — AB (ref 70–99)
Glucose-Capillary: 167 mg/dL — ABNORMAL HIGH (ref 70–99)
Glucose-Capillary: 239 mg/dL — ABNORMAL HIGH (ref 70–99)
Glucose-Capillary: 292 mg/dL — ABNORMAL HIGH (ref 70–99)

## 2018-03-05 LAB — OCCULT BLOOD X 1 CARD TO LAB, STOOL: Fecal Occult Bld: NEGATIVE

## 2018-03-05 LAB — APTT: aPTT: 31 seconds (ref 24–36)

## 2018-03-05 MED ORDER — GUAIFENESIN-DM 100-10 MG/5ML PO SYRP
5.0000 mL | ORAL_SOLUTION | ORAL | Status: DC | PRN
  Administered 2018-03-05 – 2018-03-08 (×7): 5 mL via ORAL
  Filled 2018-03-05 (×9): qty 5

## 2018-03-05 MED ORDER — IOPAMIDOL (ISOVUE-370) INJECTION 76%
75.0000 mL | Freq: Once | INTRAVENOUS | Status: AC | PRN
  Administered 2018-03-05: 75 mL via INTRAVENOUS

## 2018-03-05 MED ORDER — HEPARIN (PORCINE) IN NACL 100-0.45 UNIT/ML-% IJ SOLN
1000.0000 [IU]/h | INTRAMUSCULAR | Status: DC
  Administered 2018-03-05: 15:00:00 1100 [IU]/h via INTRAVENOUS
  Administered 2018-03-06 – 2018-03-08 (×3): 1000 [IU]/h via INTRAVENOUS
  Filled 2018-03-05 (×4): qty 250

## 2018-03-05 MED ORDER — LEVOFLOXACIN 750 MG PO TABS: 750.0000 mg | ORAL_TABLET | ORAL | 0 refills | Status: DC

## 2018-03-05 MED ORDER — GUAIFENESIN-DM 100-10 MG/5ML PO SYRP: 5.0000 mL | ORAL_SOLUTION | ORAL | 0 refills | Status: AC | PRN

## 2018-03-05 MED ORDER — LEVOFLOXACIN 750 MG PO TABS
750.0000 mg | ORAL_TABLET | ORAL | Status: DC
  Administered 2018-03-05: 750 mg via ORAL
  Filled 2018-03-05: qty 1

## 2018-03-05 MED ORDER — FUROSEMIDE 10 MG/ML IJ SOLN
20.0000 mg | Freq: Two times a day (BID) | INTRAMUSCULAR | Status: DC
  Administered 2018-03-05 (×2): 20 mg via INTRAVENOUS
  Filled 2018-03-05 (×2): qty 2

## 2018-03-05 MED ORDER — LEVOFLOXACIN 750 MG PO TABS
750.0000 mg | ORAL_TABLET | ORAL | Status: DC
  Administered 2018-03-07: 10:00:00 750 mg via ORAL
  Filled 2018-03-05: qty 1

## 2018-03-05 MED ORDER — SODIUM CHLORIDE 0.9 % IV SOLN
INTRAVENOUS | Status: AC
  Administered 2018-03-05 – 2018-03-06 (×2): via INTRAVENOUS

## 2018-03-05 NOTE — Progress Notes (Signed)
Advanced Care Plan.  Purpose of Encounter: Hospice care. Parties in Attendance: The patient, her daughter and son, me. Patient's Decisional Capacity: Yes. Medical Story: Dana Ramirez  is a 67 y.o. female with a known history of CAD, DM, HLD, Htn, Kidney stone- recent diagnosis of non small cell lung cancer- started on Chemo+ radiation, last dose 2 weeks ago.  She was admitted for sepsis due to HAP, left hydronephrosis and acute renal failure.  He has worsening shortness of breath and tachycardia since last night.  CT angiogram show massive PE.  I discussed with the patient, her daughter and his son about her current condition, very poor prognosis, options of hospice care or further treatment of PE and lung cancer.  After the discuss with the patient's husband, they decided treatment and started heparin drip. Plan:  Code Status: DNR. Time spent discussing advance care planning: 22 minutes.

## 2018-03-05 NOTE — Progress Notes (Signed)
Pharmacy Antibiotic Note  Dana Ramirez is a 67 y.o. female admitted on 03/01/2018 with pneumonia.  Pharmacy has been consulted for levaquin dosing.  Plan: Will start levaquin 750 mg PO q48h per CrCl 20 - 49 ml/min  No EKG to assess  Height: 5\' 3"  (160 cm) Weight: 145 lb (65.8 kg) IBW/kg (Calculated) : 52.4  Temp (24hrs), Avg:97.7 F (36.5 C), Min:97.5 F (36.4 C), Max:98.1 F (36.7 C)  Recent Labs  Lab 03/01/18 0915 03/01/18 2029 03/01/18 2322 03/02/18 0436 03/03/18 0447 03/04/18 0444 03/05/18 0643  WBC 9.6  --   --  10.3 9.6 13.5* 7.6  CREATININE 1.13*  --   --  1.38* 1.21* 1.25*  --   LATICACIDVEN  --  1.7 2.7* 1.4  --   --   --     Estimated Creatinine Clearance: 40.9 mL/min (A) (by C-G formula based on SCr of 1.25 mg/dL (H)).    Allergies  Allergen Reactions  . Codeine Nausea And Vomiting  . Lisinopril Cough  . Penicillins Itching    Has patient had a PCN reaction causing immediate rash, facial/tongue/throat swelling, SOB or lightheadedness with hypotension: no Has patient had a PCN reaction causing severe rash involving mucus membranes or skin necrosis: no Has patient had a PCN reaction that required hospitalization: no Has patient had a PCN reaction occurring within the last 10 years: no If all of the above answers are "NO", then may proceed with Cephalosporin use.     Thank you for allowing pharmacy to be a part of this patient's care.  Tobie Lords, PharmD, BCPS Clinical Pharmacist 03/05/2018

## 2018-03-05 NOTE — Consult Note (Signed)
ANTICOAGULATION CONSULT NOTE - Initial Consult  Pharmacy Consult for Heparin Drip  Indication: pulmonary embolus  Patient Measurements: Height: 5\' 3"  (160 cm) Weight: 145 lb (65.8 kg) IBW/kg (Calculated) : 52.4  Labs: Recent Labs    03/03/18 0447 03/04/18 0444 03/05/18 0643 03/05/18 1419 03/05/18 2100  HGB 8.4* 8.7* 8.8*  --   --   HCT 24.4* 25.5* 25.4*  --   --   PLT 143* 171 145*  --   --   APTT  --   --   --  31  --   LABPROT  --   --   --  14.3  --   INR  --   --   --  1.12  --   HEPARINUNFRC  --   --   --  0.10* 0.46  CREATININE 1.21* 1.25*  --   --   --     Assessment: Pharmacy consulted heparin dosing in 67 yo female with PE.  Patient has been receiving enoxaparin 40mg  every 24 hours while admitted. Last enoxaparin dose @1816  on 8/16.   8/17 Hgb: 8.8, Hct: 25  Plts: 145 Currently on Heparin 1100 units/hr, 21:00 Heparin level resulted at 0.46.  Goal of Therapy:  Heparin level 0.3-0.7 units/ml Monitor platelets by anticoagulation protocol: Yes   Plan:  Will continue with current rate of 1100 units/hr. Check anti-Xa level in 6 hours and daily while on heparin Continue to monitor H&H and platelets  Paulina Fusi, PharmD, BCPS 03/05/2018 9:48 PM

## 2018-03-05 NOTE — Consult Note (Signed)
ANTICOAGULATION CONSULT NOTE - Initial Consult  Pharmacy Consult for Heparin Drip  Indication: pulmonary embolus  Patient Measurements: Height: 5\' 3"  (160 cm) Weight: 145 lb (65.8 kg) IBW/kg (Calculated) : 52.4  Labs: Recent Labs    03/03/18 0447 03/04/18 0444 03/05/18 0643  HGB 8.4* 8.7* 8.8*  HCT 24.4* 25.5* 25.4*  PLT 143* 171 145*  CREATININE 1.21* 1.25*  --     Assessment: Pharmacy consulted heparin dosing in 67 yo female with PE.  Patient has been receiving enoxaparin 40mg  every 24 hours while admitted. Last enoxaparin dose @1816  on 8/16.   8/17 Hgb: 8.8, Hct: 25  Plts: 145  Goal of Therapy:  Heparin level 0.3-0.7 units/ml Monitor platelets by anticoagulation protocol: Yes   Plan:  Enoxaparin discontinued and baseline labs ordered.  No bolus, per Dr. Bridgett Larsson  Start heparin infusion at 1100 units/hr Check anti-Xa level in 6 hours and daily while on heparin Continue to monitor H&H and platelets  Pernell Dupre, PharmD, BCPS Clinical Pharmacist 03/05/2018 2:42 PM

## 2018-03-05 NOTE — Progress Notes (Signed)
Patient had a moderate amount of clear, frothy sputum with a small, brownish tinge. MD notified. Will continue to monitor.

## 2018-03-05 NOTE — Consult Note (Signed)
Reason for Consult: Right PE Referring Physician: Dr. Jerral Ralph Jenessa Gillingham is an 67 y.o. female.  HPI: Patient with metastatic lung cancer on chemo. Now with Right Pulmonary Embolism. No cardiopulmonary compromise. Some tachycardia. Otherwise hemodynamically stable. Patient states she feels better since beginning Heparin gtt.   Past Medical History:  Diagnosis Date  . CAD (coronary artery disease)   . Carotid arterial disease ([place])   . Diabetes mellitus without complication ([place])   . History of vertigo   . Hyperlipidemia   . Hypertension   . Kidney stone     Past Surgical History:  Procedure Laterality Date  . BREAST CYST ASPIRATION Left 1980s  . CESAREAN SECTION     x 2  . CHOLECYSTECTOMY    . CORONARY ARTERY BYPASS GRAFT  03/2005   LIMA to the LAD, vein graft to diagonal with a negative stress test in 06/2006  . PORTACATH PLACEMENT Left 12/31/2017   Procedure: INSERTION PORT-A-CATH;  Surgeon: Robert Bellow, MD;  Location: [facility] ORS;  Service: General;  Laterality: Left;  . SENTINEL NODE BIOPSY Right 12/31/2017   Procedure: SUPRACLAVICULAR NODE BIOPSY;  Surgeon: Robert Bellow, MD;  Location: [facility] ORS;  Service: General;  Laterality: Right;  . TONSILLECTOMY      Family History  Problem Relation Age of Onset  . Heart disease Mother   . Heart disease Father 65  . Stroke Sister 34  . Heart disease Sister   . Diabetes Brother   . Coronary artery disease Other        Strong family history of CAD and CABG  . Heart disease Maternal Grandmother   . Breast cancer Maternal Grandmother   . Heart disease Maternal Grandfather   . Heart disease Paternal Grandmother   . Breast cancer Paternal Grandmother 38  . Heart disease Paternal Grandfather     Social History:  reports that she has never smoked. She has never used smokeless tobacco. She reports that she does not drink alcohol or use drugs.  Allergies:  Allergies  Allergen Reactions  . Codeine Nausea And  Vomiting  . Lisinopril Cough  . Penicillins Itching    Has patient had a PCN reaction causing immediate rash, facial/tongue/throat swelling, SOB or lightheadedness with hypotension: no Has patient had a PCN reaction causing severe rash involving mucus membranes or skin necrosis: no Has patient had a PCN reaction that required hospitalization: no Has patient had a PCN reaction occurring within the last 10 years: no If all of the above answers are "NO", then may proceed with Cephalosporin use.     Medications: I have reviewed the patient's current medications.  Results for orders placed or performed during the hospital encounter of 03/01/18 (from the past 48 hour(s))  Glucose, capillary     Status: Abnormal   Collection Time: 03/03/18  8:58 PM  Result Value Ref Range   Glucose-Capillary 272 (H) 70 - 99 mg/dL   Comment 1 Notify RN   Glucose, capillary     Status: Abnormal   Collection Time: 03/03/18 10:19 PM  Result Value Ref Range   Glucose-Capillary 239 (H) 70 - 99 mg/dL  CBC     Status: Abnormal   Collection Time: 03/04/18  4:44 AM  Result Value Ref Range   WBC 13.5 (H) 3.6 - 11.0 K/uL   RBC 2.68 (L) 3.80 - 5.20 MIL/uL   Hemoglobin 8.7 (L) 12.0 - 16.0 g/dL   HCT 25.5 (L) 35.0 - 47.0 %   MCV 95.0  80.0 - 100.0 fL   MCH 32.6 26.0 - 34.0 pg   MCHC 34.3 32.0 - 36.0 g/dL   RDW 19.3 (H) 11.5 - 14.5 %   Platelets 171 150 - 440 K/uL    Comment: Performed at [facility], [address] [place], [place] [address]  Basic metabolic panel     Status: Abnormal   Collection Time: 03/04/18  4:44 AM  Result Value Ref Range   Sodium 133 (L) 135 - 145 mmol/L   Potassium 4.6 3.5 - 5.1 mmol/L   Chloride 107 98 - 111 mmol/L   CO2 21 (L) 22 - 32 mmol/L   Glucose, Bld 187 (H) 70 - 99 mg/dL   BUN 19 8 - 23 mg/dL   Creatinine, Ser 1.25 (H) 0.44 - 1.00 mg/dL   Calcium 7.5 (L) 8.9 - 10.3 mg/dL   GFR calc non Af Amer 44 (L) >60 mL/min   GFR calc Af Amer 51 (L) >60 mL/min    Comment:  (NOTE) The eGFR has been calculated using the CKD EPI equation. This calculation has not been validated in all clinical situations. eGFR's persistently <60 mL/min signify possible Chronic Kidney Disease.    Anion gap 5 5 - 15    Comment: Performed at [facility], [address] [place], [place] [address]  Glucose, capillary     Status: Abnormal   Collection Time: 03/04/18  8:04 AM  Result Value Ref Range   Glucose-Capillary 148 (H) 70 - 99 mg/dL  Glucose, capillary     Status: Abnormal   Collection Time: 03/04/18 12:15 PM  Result Value Ref Range   Glucose-Capillary 255 (H) 70 - 99 mg/dL  Glucose, capillary     Status: Abnormal   Collection Time: 03/04/18  5:02 PM  Result Value Ref Range   Glucose-Capillary 230 (H) 70 - 99 mg/dL  Glucose, capillary     Status: Abnormal   Collection Time: 03/04/18  8:01 PM  Result Value Ref Range   Glucose-Capillary 245 (H) 70 - 99 mg/dL   Comment 1 Notify RN    Comment 2 Document in Chart   Occult blood card to lab, stool     Status: None   Collection Time: 03/05/18  2:34 AM  Result Value Ref Range   Fecal Occult Bld NEGATIVE NEGATIVE    Comment: Performed at [facility], [address] [place], [place] [address]  CBC     Status: Abnormal   Collection Time: 03/05/18  6:43 AM  Result Value Ref Range   WBC 7.6 3.6 - 11.0 K/uL   RBC 2.71 (L) 3.80 - 5.20 MIL/uL   Hemoglobin 8.8 (L) 12.0 - 16.0 g/dL   HCT 25.4 (L) 35.0 - 47.0 %   MCV 94.0 80.0 - 100.0 fL   MCH 32.6 26.0 - 34.0 pg   MCHC 34.6 32.0 - 36.0 g/dL   RDW 19.6 (H) 11.5 - 14.5 %   Platelets 145 (L) 150 - 440 K/uL    Comment: Performed at [facility], [address] [place], [place] [address]  Glucose, capillary     Status: Abnormal   Collection Time: 03/05/18  7:38 AM  Result Value Ref Range   Glucose-Capillary 167 (H) 70 - 99 mg/dL  Glucose, capillary     Status: Abnormal   Collection Time: 03/05/18 11:44 AM  Result Value Ref Range    Glucose-Capillary 239 (H) 70 - 99 mg/dL  APTT     Status: None  Collection Time: 03/05/18  2:19 PM  Result Value Ref Range   aPTT 31 24 - 36 seconds    Comment: Performed at [facility], [address] [place], [place] [address]  Protime-INR     Status: None   Collection Time: 03/05/18  2:19 PM  Result Value Ref Range   Prothrombin Time 14.3 11.4 - 15.2 seconds   INR 1.12     Comment: Performed at [facility], [place], [place] [address]  Heparin level (unfractionated)     Status: Abnormal   Collection Time: 03/05/18  2:19 PM  Result Value Ref Range   Heparin Unfractionated 0.10 (L) 0.30 - 0.70 IU/mL    Comment: (NOTE) If heparin results are below expected values, and patient dosage has  been confirmed, suggest follow up testing of antithrombin III levels. Performed at [facility], [address] [place], [place] [address]   Glucose, capillary     Status: Abnormal   Collection Time: 03/05/18  5:23 PM  Result Value Ref Range   Glucose-Capillary 243 (H) 70 - 99 mg/dL    Ct Angio Chest Pe W Or Wo Contrast  Result Date: 03/05/2018 CLINICAL DATA:  67 year old with worsening shortness of breath and chest discomfort. Right lung cancer. EXAM: CT ANGIOGRAPHY CHEST WITH CONTRAST TECHNIQUE: Multidetector CT imaging of the chest was performed using the standard protocol during bolus administration of intravenous contrast. Multiplanar CT image reconstructions and MIPs were obtained to evaluate the vascular anatomy. CONTRAST:  47m ISOVUE-370 IOPAMIDOL (ISOVUE-370) INJECTION 76% COMPARISON:  03/01/2018 FINDINGS: Cardiovascular: There is minimal blood flow in the main right pulmonary artery related to a large thrombus burden. There is a small amount of thrombus extending into the main pulmonary artery and near the origin of the left main pulmonary artery. Scattered emboli in the segmental branches of the left pulmonary arteries. There is no blood flow  within the lobar and segmental right pulmonary arteries. Evidence of previous CABG procedure. No significant pericardial fluid. Mediastinum/Nodes: Again noted is large amount of soft tissue in the right hilum and right side of the mediastinum. The soft tissue is encompassing the right bronchus intermedius. This soft tissue mass may have enlarged in size measuring up to 6.2 cm on sequence 5 image 47 previously measuring roughly 3.9 cm. However, it is difficult to measure this tissue due to the adjacent volume loss in the right lung. Large amount of soft tissue in the right upper mediastinum measuring up to 3.7 cm in thickness on sequence 5, 28 and previously measured roughly 3.0 cm on the recent comparison examination. Probable small supraclavicular lymph nodes. Lungs/Pleura: Trachea is patent. Right upper lobe is patent. There is marked narrowing throughout the right bronchus intermedius. Markedly decreased aeration in the right lower lobe with extensive consolidation. The patient has developed a small right pleural effusion. There appears to be no aeration to the right middle lobe. Increased patchy parenchymal densities throughout the right upper lung. Densities along the medial left upper lobe probably related to atelectasis. Upper Abdomen: Again noted is a soft tissue nodule along the posterior right hemidiaphragm measuring roughly 2.0 cm. Small peritoneal nodule in the left upper quadrant on image 76. There is persistent moderate left hydronephrosis. There is at least mild right hydronephrosis. Musculoskeletal: Again noted are enlarged lymph nodes in the left sub pectoralis region. Again noted is a left chest Port-A-Cath. The catheter tip is near the superior cavoatrial junction. Review of the MIP images confirms the above  findings. IMPRESSION: Massive pulmonary embolism involving the main right pulmonary artery with complete occlusion of the distal right pulmonary artery and right pulmonary artery branches.  Right pulmonary artery is adjacent to the known mediastinal tumor and tumor thrombus cannot be excluded. Positive for small pulmonary emboli within left pulmonary artery branches. Extensive volume loss throughout the right lung related to compression on the airways to the right lower lobe and right middle lobe. Aeration to the right lower lobe has markedly decreased compared 03/01/2018 and patient has developed a small right pleural effusion. In addition, there is now patchy disease throughout the right upper lung. Soft tissue masses in the mediastinum and right hilum may have enlarged since 03/01/18 but difficult to accurately measure due to the extensive volume loss in the right hemithorax. Again noted is metastatic disease in the upper abdomen and left sub pectoralis region. Again noted is bilateral hydronephrosis, left side greater than right. Critical Value/emergent results were called by telephone at the time of interpretation on 03/05/2018 at 11:42 am to Dr. Demetrios Loll , who verbally acknowledged these results. Electronically Signed   By: Markus Daft M.D.   On: 03/05/2018 11:56   US Venous Img Lower Bilateral  Result Date: 03/05/2018 CLINICAL DATA:  67 year old with lung cancer and pulmonary embolism. EXAM: BILATERAL LOWER EXTREMITY VENOUS DOPPLER ULTRASOUND TECHNIQUE: Gray-scale sonography with graded compression, as well as color Doppler and duplex ultrasound were performed to evaluate the lower extremity deep venous systems from the level of the common femoral vein and including the common femoral, femoral, profunda femoral, popliteal and calf veins including the posterior tibial, peroneal and gastrocnemius veins when visible. The superficial great saphenous vein was also interrogated. Spectral Doppler was utilized to evaluate flow at rest and with distal augmentation maneuvers in the common femoral, femoral and popliteal veins. COMPARISON:  None. FINDINGS: RIGHT LOWER EXTREMITY Common Femoral Vein: No  evidence of thrombus. Normal compressibility, respiratory phasicity and response to augmentation. Saphenofemoral Junction: No evidence of thrombus. Normal compressibility and flow on color Doppler imaging. Profunda Femoral Vein: No evidence of thrombus. Normal compressibility and flow on color Doppler imaging. Femoral Vein: No evidence of thrombus. Normal compressibility, respiratory phasicity and response to augmentation. Popliteal Vein: No evidence of thrombus. Normal compressibility, respiratory phasicity and response to augmentation. Calf Veins: Visualized right deep calf veins are patent without thrombus. Limited evaluation of the peroneal veins. LEFT LOWER EXTREMITY Common Femoral Vein: No evidence of thrombus. Normal compressibility, respiratory phasicity and response to augmentation. Saphenofemoral Junction: No evidence of thrombus. Normal compressibility and flow on color Doppler imaging. Profunda Femoral Vein: No evidence of thrombus. Normal compressibility and flow on color Doppler imaging. Femoral Vein: No evidence of thrombus. Normal compressibility, respiratory phasicity and response to augmentation. Popliteal Vein: No evidence of thrombus. Normal compressibility, respiratory phasicity and response to augmentation. Calf Veins: Visualized left deep calf veins are patent without thrombus. Limited evaluation of the peroneal veins. IMPRESSION: No evidence of deep venous thrombosis in the lower extremities. Electronically Signed   By: Markus Daft M.D.   On: 03/05/2018 18:24    Review of Systems  Constitutional: Positive for malaise/fatigue.  HENT: Negative.   Eyes: Negative.   Respiratory: Positive for shortness of breath and wheezing.   Cardiovascular: Positive for chest pain.  Skin: Negative.    Blood pressure 134/71, pulse (!) 103, temperature 98 F (36.7 C), temperature source Oral, resp. rate 18, height '5\' 3"'$  (1.6 m), weight 65.8 kg, SpO2 100 %. Physical Exam  Nursing note  and vitals  reviewed. Constitutional: She is oriented to person, place, and time. She appears well-developed.  Cardiovascular: Regular rhythm.  tachycardia  Respiratory: No respiratory distress. She has no wheezes.  GI: Soft.  Neurological: She is alert and oriented to person, place, and time.    Assessment/Plan: Pulmonary Embolism. No cardiopulmonary compromise. Some Tachycardia. 100% on 2L.  Continue Heparin gtt.  Supportive care. No intervention at this time.   Eniya Cannady A 03/05/2018, 7:42 PM

## 2018-03-05 NOTE — Progress Notes (Addendum)
[facility] NAME: Dana Ramirez    MR#:  [id number]  DATE OF BIRTH:  11/06/1950  SUBJECTIVE:   The patient had shortness of breath and anxiety last night, given Ativan.  Tachycardia to 120s.  REVIEW OF SYSTEMS:    Review of Systems  Constitutional: Negative for chills and fever.  HENT: Negative for congestion and tinnitus.   Eyes: Negative for blurred vision and double vision.  Respiratory: Positive for shortness of breath (improved. ). Negative for cough and wheezing.   Cardiovascular: Negative for chest pain, orthopnea and PND.  Gastrointestinal: Negative for abdominal pain, diarrhea, nausea and vomiting.  Genitourinary: Negative for dysuria and hematuria.  Neurological: Positive for weakness (Generalized. ). Negative for dizziness, sensory change and focal weakness.  All other systems reviewed and are negative.   Nutrition: Heart Healthy/Carb control Tolerating Diet: Yes Tolerating PT: Await Eval.   DRUG ALLERGIES:   Allergies  Allergen Reactions  . Codeine Nausea And Vomiting  . Lisinopril Cough  . Penicillins Itching    Has patient had a PCN reaction causing immediate rash, facial/tongue/throat swelling, SOB or lightheadedness with hypotension: no Has patient had a PCN reaction causing severe rash involving mucus membranes or skin necrosis: no Has patient had a PCN reaction that required hospitalization: no Has patient had a PCN reaction occurring within the last 10 years: no If all of the above answers are "NO", then may proceed with Cephalosporin use.     VITALS:  Blood pressure 132/84, pulse (!) 128, temperature (!) 97.5 F (36.4 C), temperature source Oral, resp. rate 18, height 5\' 3"  (1.6 m), weight 65.8 kg, SpO2 100 %.  PHYSICAL EXAMINATION:   Physical Exam  GENERAL:  67 y.o.-year-old patient lying in bed in NAD.    EYES: Pupils equal, round, reactive to light and accommodation. No scleral icterus.  Extraocular muscles intact.  HEENT: Head atraumatic, normocephalic. Oropharynx and nasopharynx clear.  NECK:  Supple, no jugular venous distention. No thyroid enlargement, no tenderness.  LUNGS: Diminished lung sounds on the right side, no wheezing, rales, rhonchi. No use of accessory muscles of respiration.  CARDIOVASCULAR: S1, S2 normal. No murmurs, rubs, or gallops.  ABDOMEN: Soft, nontender, nondistended. Bowel sounds present. No organomegaly or mass.  EXTREMITIES: No cyanosis, clubbing.  Bilateral leg edema. NEUROLOGIC: Cranial nerves II through XII are intact. No focal Motor or sensory deficits b/l.  Globally weak.  PSYCHIATRIC: The patient is alert and oriented x 3.  SKIN: No obvious rash, lesion, or ulcer.    LABORATORY PANEL:   CBC Recent Labs  Lab 03/05/18 0643  WBC 7.6  HGB 8.8*  HCT 25.4*  PLT 145*   ------------------------------------------------------------------------------------------------------------------  Chemistries  Recent Labs  Lab 03/01/18 0915  03/04/18 0444  NA 131*   < > 133*  K 3.8   < > 4.6  CL 96*   < > 107  CO2 22   < > 21*  GLUCOSE 321*   < > 187*  BUN 24*   < > 19  CREATININE 1.13*   < > 1.25*  CALCIUM 9.2   < > 7.5*  AST 19  --   --   ALT 17  --   --   ALKPHOS 87  --   --   BILITOT 0.5  --   --    < > = values in this interval not displayed.   ------------------------------------------------------------------------------------------------------------------  Cardiac Enzymes No results for input(s): TROPONINI in the  last 168 hours. ------------------------------------------------------------------------------------------------------------------  RADIOLOGY:  Ct Angio Chest Pe W Or Wo Contrast  Result Date: 03/05/2018 CLINICAL DATA:  67 year old with worsening shortness of breath and chest discomfort. Right lung cancer. EXAM: CT ANGIOGRAPHY CHEST WITH CONTRAST TECHNIQUE: Multidetector CT imaging of the chest was performed using the  standard protocol during bolus administration of intravenous contrast. Multiplanar CT image reconstructions and MIPs were obtained to evaluate the vascular anatomy. CONTRAST:  95mL ISOVUE-370 IOPAMIDOL (ISOVUE-370) INJECTION 76% COMPARISON:  03/01/2018 FINDINGS: Cardiovascular: There is minimal blood flow in the main right pulmonary artery related to a large thrombus burden. There is a small amount of thrombus extending into the main pulmonary artery and near the origin of the left main pulmonary artery. Scattered emboli in the segmental branches of the left pulmonary arteries. There is no blood flow within the lobar and segmental right pulmonary arteries. Evidence of previous CABG procedure. No significant pericardial fluid. Mediastinum/Nodes: Again noted is large amount of soft tissue in the right hilum and right side of the mediastinum. The soft tissue is encompassing the right bronchus intermedius. This soft tissue mass may have enlarged in size measuring up to 6.2 cm on sequence 5 image 47 previously measuring roughly 3.9 cm. However, it is difficult to measure this tissue due to the adjacent volume loss in the right lung. Large amount of soft tissue in the right upper mediastinum measuring up to 3.7 cm in thickness on sequence 5, 28 and previously measured roughly 3.0 cm on the recent comparison examination. Probable small supraclavicular lymph nodes. Lungs/Pleura: Trachea is patent. Right upper lobe is patent. There is marked narrowing throughout the right bronchus intermedius. Markedly decreased aeration in the right lower lobe with extensive consolidation. The patient has developed a small right pleural effusion. There appears to be no aeration to the right middle lobe. Increased patchy parenchymal densities throughout the right upper lung. Densities along the medial left upper lobe probably related to atelectasis. Upper Abdomen: Again noted is a soft tissue nodule along the posterior right hemidiaphragm  measuring roughly 2.0 cm. Small peritoneal nodule in the left upper quadrant on image 76. There is persistent moderate left hydronephrosis. There is at least mild right hydronephrosis. Musculoskeletal: Again noted are enlarged lymph nodes in the left sub pectoralis region. Again noted is a left chest Port-A-Cath. The catheter tip is near the superior cavoatrial junction. Review of the MIP images confirms the above findings. IMPRESSION: Massive pulmonary embolism involving the main right pulmonary artery with complete occlusion of the distal right pulmonary artery and right pulmonary artery branches. Right pulmonary artery is adjacent to the known mediastinal tumor and tumor thrombus cannot be excluded. Positive for small pulmonary emboli within left pulmonary artery branches. Extensive volume loss throughout the right lung related to compression on the airways to the right lower lobe and right middle lobe. Aeration to the right lower lobe has markedly decreased compared 03/01/2018 and patient has developed a small right pleural effusion. In addition, there is now patchy disease throughout the right upper lung. Soft tissue masses in the mediastinum and right hilum may have enlarged since 03/01/18 but difficult to accurately measure due to the extensive volume loss in the right hemithorax. Again noted is metastatic disease in the upper abdomen and left sub pectoralis region. Again noted is bilateral hydronephrosis, left side greater than right. Critical Value/emergent results were called by telephone at the time of interpretation on 03/05/2018 at 11:42 am to Dr. Demetrios Loll , who verbally acknowledged these results.  Electronically Signed   By: Markus Daft M.D.   On: 03/05/2018 11:56     ASSESSMENT AND PLAN:   67 year old female with past medical history of non-small cell lung cancer, diabetes, hypertension, hyperlipidemia, history of nephrolithiasis, carotid artery disease who presented to the hospital due to  shortness of breath, chest pain and cough.  1.  Pneumonia-suspected to be healthcare associated pneumonia. -Much improved since yesterday.  Patient is afebrile, hemodynamically stable, shortness of breath is improved. -Continue IV cefepime and will switch to oral antibiotics on discharge.  2. Sepsis- suspected to be secondary to pneumonia. -Continue IV cefepime as mentioned above.  Follow cultures which are currently negative.    3.  Left flank pain/hydronephrosis- patient had some mild left flank pain and ultrasound showed mild left hydronephrosis.  Seen by urology and given mild acute kidney injury they were considering stent versus percutaneous nephrostomy tube, although patient's creatinine has improved and she has no acute abdominal pain and is urinating well and therefore no plans for intervention.  Continue supportive care for now. - Patient to have repeat ultrasound done in the next 2 weeks.  4.  Acute kidney injury- secondary to dehydration/nephrolithiasis/ intrinsic compression from underlying malignancy. -Improved with IV fluids, seen by urology no plans for intervention as mentioned above.   - Creatinine improving with supportive care we will continue to monitor.  5.  High Grade non-small cell lung cancer with recurrence-seen by oncology.  Patient is status post chemoradiation but has now worsening recurrence.   Patient's case discussed at tumor board, discussed with oncologist and plan on starting [place] as an outpatient next week once patient recovers from her pneumonia.  6.  Essential hypertension-continue Toprol.  7.  Hyperlipidemia-continue Crestor.  8.  Anxiety-continue Ativan as needed.  Acute bilateral pulmonary embolism. Massive pulmonary embolism involving the main right pulmonary artery with complete occlusion of the distal right pulmonary artery and right pulmonary artery branches. Right pulmonary artery is adjacent to the known mediastinal tumor and tumor  thrombus cannot be Excluded. Positive for small pulmonary emboli within left pulmonary artery Branches.  Start heparin drip after discussion with Dr. Tasia Catchings and the patient daughter and son. Venous duplex and vascular surgery consult.  Bloody stool last night.  Stool occult is negative.  High risk for endoscopy per Dr. Vicente Males.  Very poor prognosis. All the records are reviewed and case discussed with Care Management/Social Worker. Management plans discussed with the patient, family and they are in agreement.  CODE STATUS: DNR  TOTAL TIME TAKING CARE OF THIS PATIENT: 38 minutes.   POSSIBLE D/C IN 2-3 DAYS, DEPENDING ON CLINICAL CONDITION.   Demetrios Loll M.D on 03/05/2018 at 3:09 PM  Between 7am to 6pm - Pager - [phone]  After 6pm go to [email or web address] - Proofreader  [facility]  Office  [phone]  CC: Primary care physician; Crecencio Mc, MD

## 2018-03-05 NOTE — Consult Note (Signed)
[name] , MD [address], [place], [place], [place], [address] [address], [place], [place], [place], [address] Phone: [phone]  Fax: [phone]  Consultation  Referring Provider:  Dr Bridgett Larsson Primary Care Physician:  Crecencio Mc, MD Primary Gastroenterologist:  None         Reason for Consultation:     Blood in stool  Date of Admission:  03/01/2018 Date of Consultation:  03/05/2018         HPI:   Dana Ramirez is a 67 y.o. female has been admitted in the hospital on 03/01/2018 with pneumonia.  She has a history of non-small cell lung cancer on chemoradiation treatment last dose was about 3 weeks back.  Other medical comorbidities include CAD, diabetes mellitus, hyperlipidemia, hypertension.  I have been consulted for blood in the stool by Dr. Bridgett Larsson.  Hemoglobin of the last 2 days has actually gone up from 8.4-8.8.  3 weeks back hemoglobin was 11.1.  I see that she has had a CT angiogram of the chest without contrast today and shows massive pulmonary embolism involving the my main right pulmonary artery with complete occlusion of the distal right pulmonary artery and right pulmonary artery branches and a small pulmonary emboli with left pulmonary artery branches as well.  There is secondary lung collapse due to compression of on the airways.  She was in with her family when I went in , small qty of red blood with her stool the size of a quarter yesterday. None after. She is short of breath.   CBC Latest Ref Rng & Units 03/05/2018 03/04/2018 03/03/2018  WBC 3.6 - 11.0 K/uL 7.6 13.5(H) 9.6  Hemoglobin 12.0 - 16.0 g/dL 8.8(L) 8.7(L) 8.4(L)  Hematocrit 35.0 - 47.0 % 25.4(L) 25.5(L) 24.4(L)  Platelets 150 - 440 K/uL 145(L) 171 143(L)   In addition to pneumonia patient has AKI likely secondary to metastatic cancer and obstruction.  Past Medical History:  Diagnosis Date  . CAD (coronary artery disease)   . Carotid arterial disease ([place])   . Diabetes mellitus without  complication ([place])   . History of vertigo   . Hyperlipidemia   . Hypertension   . Kidney stone     Past Surgical History:  Procedure Laterality Date  . BREAST CYST ASPIRATION Left 1980s  . CESAREAN SECTION     x 2  . CHOLECYSTECTOMY    . CORONARY ARTERY BYPASS GRAFT  03/2005   LIMA to the LAD, vein graft to diagonal with a negative stress test in 06/2006  . PORTACATH PLACEMENT Left 12/31/2017   Procedure: INSERTION PORT-A-CATH;  Surgeon: Robert Bellow, MD;  Location: [facility] ORS;  Service: General;  Laterality: Left;  . SENTINEL NODE BIOPSY Right 12/31/2017   Procedure: SUPRACLAVICULAR NODE BIOPSY;  Surgeon: Robert Bellow, MD;  Location: [facility] ORS;  Service: General;  Laterality: Right;  . TONSILLECTOMY      Prior to Admission medications   Medication Sig Start Date End Date Taking? Authorizing Provider  acetaminophen (TYLENOL) 650 MG CR tablet Take 650 mg by mouth every 8 (eight) hours as needed for pain.   Yes [provider]  aspirin (ASPIR-LOW) 81 MG EC tablet Take 81 mg by mouth 2 (two) times daily.    Yes [provider]  Cholecalciferol (VITAMIN D3) 1000 units CAPS Take 1,000 Units by mouth daily.    Yes [provider]  cyanocobalamin 1000 MCG tablet Take 1,000 mcg by mouth daily.   Yes [provider]  JANUVIA 100 MG tablet TAKE ONE TABLET BY MOUTH DAILY Patient taking differently: Take 100 mg by mouth daily.  08/13/17  Yes Crecencio Mc, MD  LORazepam (ATIVAN) 0.5 MG tablet Take 1 tablet (0.5 mg total) by mouth every 8 (eight) hours as needed for anxiety (nausea). 02/11/18  Yes Earlie Server, MD  metFORMIN (GLUCOPHAGE) 1000 MG tablet TAKE ONE TABLET BY MOUTH DAILY Patient taking differently: Take 1,000 mg by mouth every evening.  05/20/17  Yes Crecencio Mc, MD  metoprolol succinate (TOPROL-XL) 25 MG 24 hr tablet TAKE 1 TABLET DAILY Patient taking differently: Take 25 mg by mouth daily.  09/03/17  Yes Minna Merritts, MD  naproxen  sodium (ALEVE) 220 MG tablet Take 220 mg by mouth 2 (two) times daily as needed (pain).    Yes [provider]  omeprazole (PRILOSEC OTC) 20 MG tablet Take 20 mg by mouth daily.   Yes [provider]  predniSONE (DELTASONE) 5 MG tablet Take 1 tablet (5 mg total) by mouth daily with breakfast. 03/01/18  Yes Verlon Au, NP  rosuvastatin (CRESTOR) 20 MG tablet TAKE ONE TABLET BY MOUTH DAILY Patient taking differently: Take 20 mg by mouth at bedtime.  11/15/17  Yes Gollan, Kathlene November, MD  albuterol (PROVENTIL HFA;VENTOLIN HFA) 108 (90 Base) MCG/ACT inhaler Inhale 2 puffs into the lungs every 6 (six) hours as needed for wheezing or shortness of breath. Patient not taking: Reported on 03/01/2018 12/21/17   Earlie Server, MD  chlorpheniramine-HYDROcodone (TUSSIONEX) 10-8 MG/5ML SUER Take 5 mLs by mouth every 12 (twelve) hours as needed for cough. 03/01/18   Verlon Au, NP  cyclobenzaprine (FLEXERIL) 10 MG tablet Take 1 tablet (10 mg total) by mouth 3 (three) times daily as needed for muscle spasms. Patient not taking: Reported on 03/01/2018 11/04/17   Crecencio Mc, MD  glipiZIDE (GLUCOTROL XL) 2.5 MG 24 hr tablet TAKE ONE TABLET BY MOUTH EVERY MORNING WITH BREAKFAST Patient not taking: Reported on 03/01/2018 07/28/17   Crecencio Mc, MD  guaiFENesin-dextromethorphan (ROBITUSSIN DM) 100-10 MG/5ML syrup Take 5 mLs by mouth every 4 (four) hours as needed for cough (chest congestion). 03/05/18   Demetrios Loll, MD  levofloxacin (LEVAQUIN) 750 MG tablet Take 1 tablet (750 mg total) by mouth every other day. 03/05/18   Demetrios Loll, MD  silver sulfADIAZINE (SILVADENE) 1 % cream Apply 1 application topically 2 (two) times daily. Patient not taking: Reported on 03/02/2018 02/18/18   Noreene Filbert, MD  traMADol (ULTRAM) 50 MG tablet Take 1 tablet (50 mg total) by mouth every 6 (six) hours as needed. Patient not taking: Reported on 02/09/2018 12/07/17   Crecencio Mc, MD  prochlorperazine (COMPAZINE) 10 MG  tablet Take 1 tablet (10 mg total) by mouth every 6 (six) hours as needed (Nausea or vomiting). Patient not taking: Reported on 02/09/2018 01/06/18 03/03/18  Earlie Server, MD    Family History  Problem Relation Age of Onset  . Heart disease Mother   . Heart disease Father 6  . Stroke Sister 52  . Heart disease Sister   . Diabetes Brother   . Coronary artery disease Other        Strong family history of CAD and CABG  . Heart disease Maternal Grandmother   . Breast cancer Maternal Grandmother   . Heart disease Maternal Grandfather   . Heart disease Paternal Grandmother   . Breast cancer Paternal Grandmother 47  . Heart disease Paternal Grandfather  Social History   Tobacco Use  . Smoking status: Never Smoker  . Smokeless tobacco: Never Used  Substance Use Topics  . Alcohol use: No  . Drug use: No    Allergies as of 03/01/2018 - Review Complete 03/01/2018  Allergen Reaction Noted  . Codeine Nausea And Vomiting 12/19/2009  . Lisinopril Cough 06/03/2015  . Penicillins Itching     Review of Systems:    All systems reviewed and negative except where noted in HPI.   Physical Exam:  Vital signs in last 24 hours: Temp:  [97.5 F (36.4 C)-98.1 F (36.7 C)] 97.5 F (36.4 C) (08/16 1958) Pulse Rate:  [123-128] 128 (08/16 1958) Resp:  [16-18] 18 (08/16 1958) BP: (112-132)/(84-85) 132/84 (08/16 1958) SpO2:  [91 %-100 %] 100 % (08/16 1958) Last BM Date: 03/04/18 General:   Comfortable laying flat Head:  Normocephalic and atraumatic. Eyes:   No icterus.   Conjunctiva pink. PERRLA. Ears:  Normal auditory acuity. Neck:  Supple; no masses or thyroidomegaly JVD raised almost till her ear lobe  Lungs: B/l expiratory wheeze, no added sounds, air entery equal b/l  Heart:  Regular rate and rhythm;  Without murmur, clicks, rubs or gallops Abdomen:  Soft, nondistended, nontender. Normal bowel sounds. No appreciable masses or hepatomegaly.  No rebound or guarding.  Neurologic:  Alert  and oriented x3;  grossly normal neurologically. Skin:  Intact without significant lesions or rashes. Cervical Nodes:  No significant cervical adenopathy. Psych:  Alert and cooperative. Normal affect.  LAB RESULTS: Recent Labs    03/03/18 0447 03/04/18 0444 03/05/18 0643  WBC 9.6 13.5* 7.6  HGB 8.4* 8.7* 8.8*  HCT 24.4* 25.5* 25.4*  PLT 143* 171 145*   BMET Recent Labs    03/03/18 0447 03/04/18 0444  NA 134* 133*  K 3.9 4.6  CL 107 107  CO2 20* 21*  GLUCOSE 176* 187*  BUN 18 19  CREATININE 1.21* 1.25*  CALCIUM 6.8* 7.5*   LFT No results for input(s): PROT, ALBUMIN, AST, ALT, ALKPHOS, BILITOT, BILIDIR, IBILI in the last 72 hours. PT/INR No results for input(s): LABPROT, INR in the last 72 hours.  STUDIES: Ct Angio Chest Pe W Or Wo Contrast  Result Date: 03/05/2018 CLINICAL DATA:  67 year old with worsening shortness of breath and chest discomfort. Right lung cancer. EXAM: CT ANGIOGRAPHY CHEST WITH CONTRAST TECHNIQUE: Multidetector CT imaging of the chest was performed using the standard protocol during bolus administration of intravenous contrast. Multiplanar CT image reconstructions and MIPs were obtained to evaluate the vascular anatomy. CONTRAST:  72mL ISOVUE-370 IOPAMIDOL (ISOVUE-370) INJECTION 76% COMPARISON:  03/01/2018 FINDINGS: Cardiovascular: There is minimal blood flow in the main right pulmonary artery related to a large thrombus burden. There is a small amount of thrombus extending into the main pulmonary artery and near the origin of the left main pulmonary artery. Scattered emboli in the segmental branches of the left pulmonary arteries. There is no blood flow within the lobar and segmental right pulmonary arteries. Evidence of previous CABG procedure. No significant pericardial fluid. Mediastinum/Nodes: Again noted is large amount of soft tissue in the right hilum and right side of the mediastinum. The soft tissue is encompassing the right bronchus intermedius.  This soft tissue mass may have enlarged in size measuring up to 6.2 cm on sequence 5 image 47 previously measuring roughly 3.9 cm. However, it is difficult to measure this tissue due to the adjacent volume loss in the right lung. Large amount of soft tissue in the right  upper mediastinum measuring up to 3.7 cm in thickness on sequence 5, 28 and previously measured roughly 3.0 cm on the recent comparison examination. Probable small supraclavicular lymph nodes. Lungs/Pleura: Trachea is patent. Right upper lobe is patent. There is marked narrowing throughout the right bronchus intermedius. Markedly decreased aeration in the right lower lobe with extensive consolidation. The patient has developed a small right pleural effusion. There appears to be no aeration to the right middle lobe. Increased patchy parenchymal densities throughout the right upper lung. Densities along the medial left upper lobe probably related to atelectasis. Upper Abdomen: Again noted is a soft tissue nodule along the posterior right hemidiaphragm measuring roughly 2.0 cm. Small peritoneal nodule in the left upper quadrant on image 76. There is persistent moderate left hydronephrosis. There is at least mild right hydronephrosis. Musculoskeletal: Again noted are enlarged lymph nodes in the left sub pectoralis region. Again noted is a left chest Port-A-Cath. The catheter tip is near the superior cavoatrial junction. Review of the MIP images confirms the above findings. IMPRESSION: Massive pulmonary embolism involving the main right pulmonary artery with complete occlusion of the distal right pulmonary artery and right pulmonary artery branches. Right pulmonary artery is adjacent to the known mediastinal tumor and tumor thrombus cannot be excluded. Positive for small pulmonary emboli within left pulmonary artery branches. Extensive volume loss throughout the right lung related to compression on the airways to the right lower lobe and right middle  lobe. Aeration to the right lower lobe has markedly decreased compared 03/01/2018 and patient has developed a small right pleural effusion. In addition, there is now patchy disease throughout the right upper lung. Soft tissue masses in the mediastinum and right hilum may have enlarged since 03/01/18 but difficult to accurately measure due to the extensive volume loss in the right hemithorax. Again noted is metastatic disease in the upper abdomen and left sub pectoralis region. Again noted is bilateral hydronephrosis, left side greater than right. Critical Value/emergent results were called by telephone at the time of interpretation on 03/05/2018 at 11:42 am to Dr. Demetrios Loll , who verbally acknowledged these results. Electronically Signed   By: Markus Daft M.D.   On: 03/05/2018 11:56      Impression / Plan:   Dana Ramirez is a 67 y.o. y/o female with with non-small cell lung cancer admitted to the hospital with a pneumonia and a CT angiogram at this point of time suggest an massive pulmonary embolism.  I have been consulted for blood in the stool with a hemoglobin which has been stable, non efurther .  At this point of time with an active pneumonia and a massive pulmonary embolism the risks of anesthesia, sedation, endoscopy would be at extremely high risk to be even considered.  It would only be considered if at all if she is hemorrhaging.  It is also very likely the effects of anemia due to chemotherapy.  At this point of time I have no further recommendations but to treat her life-threatening pulmonary embolism as her main priority.  If she were to bleed overtly then we will have to reevaluate our options .  I will sign off.  Please call me if any further GI concerns or questions.  We would like to thank you for the opportunity to participate in the care of Dana Ramirez.   Thank you for involving me in the care of this patient.      LOS: 4 days   [name], MD  03/05/2018, 12:40  PM

## 2018-03-06 LAB — BASIC METABOLIC PANEL
ANION GAP: 8 (ref 5–15)
BUN: 22 mg/dL (ref 8–23)
CALCIUM: 8.1 mg/dL — AB (ref 8.9–10.3)
CO2: 24 mmol/L (ref 22–32)
Chloride: 103 mmol/L (ref 98–111)
Creatinine, Ser: 1.54 mg/dL — ABNORMAL HIGH (ref 0.44–1.00)
GFR calc Af Amer: 40 mL/min — ABNORMAL LOW (ref >60)
GFR, EST NON AFRICAN AMERICAN: 34 mL/min — AB (ref >60)
Glucose, Bld: 180 mg/dL — ABNORMAL HIGH (ref 70–99)
POTASSIUM: 3.6 mmol/L (ref 3.5–5.1)
SODIUM: 135 mmol/L (ref 135–145)

## 2018-03-06 LAB — HEPARIN LEVEL (UNFRACTIONATED)
HEPARIN UNFRACTIONATED: 0.71 [IU]/mL — AB (ref 0.30–0.70)
Heparin Unfractionated: 0.43 IU/mL (ref 0.30–0.70)
Heparin Unfractionated: 0.43 IU/mL (ref 0.30–0.70)

## 2018-03-06 LAB — GLUCOSE, CAPILLARY
GLUCOSE-CAPILLARY: 179 mg/dL — AB (ref 70–99)
GLUCOSE-CAPILLARY: 315 mg/dL — AB (ref 70–99)
GLUCOSE-CAPILLARY: 218 mg/dL — AB (ref 70–99)
Glucose-Capillary: 172 mg/dL — ABNORMAL HIGH (ref 70–99)

## 2018-03-06 LAB — CBC
HEMATOCRIT: 27.4 % — AB (ref 35.0–47.0)
Hemoglobin: 9.5 g/dL — ABNORMAL LOW (ref 12.0–16.0)
MCH: 32.8 pg (ref 26.0–34.0)
MCHC: 34.9 g/dL (ref 32.0–36.0)
MCV: 94.1 fL (ref 80.0–100.0)
PLATELETS: 146 10*3/uL — AB (ref 150–440)
RBC: 2.91 MIL/uL — ABNORMAL LOW (ref 3.80–5.20)
RDW: 19.3 % — AB (ref 11.5–14.5)
WBC: 5.1 10*3/uL (ref 3.6–11.0)

## 2018-03-06 LAB — CULTURE, BLOOD (ROUTINE X 2)
Culture: NO GROWTH
Culture: NO GROWTH
Special Requests: ADEQUATE

## 2018-03-06 MED ORDER — INSULIN GLARGINE 100 UNIT/ML ~~LOC~~ SOLN
10.0000 [IU] | Freq: Every day | SUBCUTANEOUS | Status: DC
  Administered 2018-03-06 – 2018-03-10 (×5): 10 [IU] via SUBCUTANEOUS
  Filled 2018-03-06 (×5): qty 0.1

## 2018-03-06 NOTE — Consult Note (Signed)
ANTICOAGULATION CONSULT NOTE - Initial Consult  Pharmacy Consult for Heparin Drip  Indication: pulmonary embolus  Patient Measurements: Height: 5\' 3"  (160 cm) Weight: 145 lb (65.8 kg) IBW/kg (Calculated) : 52.4  Labs: Recent Labs    03/04/18 0444 03/05/18 0643 03/05/18 1419 03/05/18 2100 03/06/18 0407  HGB 8.7* 8.8*  --   --  9.5*  HCT 25.5* 25.4*  --   --  27.4*  PLT 171 145*  --   --  146*  APTT  --   --  31  --   --   LABPROT  --   --  14.3  --   --   INR  --   --  1.12  --   --   HEPARINUNFRC  --   --  0.10* 0.46 0.71*  CREATININE 1.25*  --   --   --  1.54*    Assessment: Pharmacy consulted heparin dosing in 67 yo female with PE.  Patient has been receiving enoxaparin 40mg  every 24 hours while admitted. Last enoxaparin dose @1816  on 8/16.   8/17 Hgb: 8.8, Hct: 25  Plts: 145 Currently on Heparin 1100 units/hr, 21:00 Heparin level resulted at 0.46.  Goal of Therapy:  Heparin level 0.3-0.7 units/ml Monitor platelets by anticoagulation protocol: Yes   Plan:  08/18 @ 0400 HL 0.71 slightly supratherapeutic. Will decrease down to 1000 units/hr and will recheck next anti-Xa @ 1000. CBC stable.  Tobie Lords, PharmD, BCPS Clinical Pharmacist 03/06/2018

## 2018-03-06 NOTE — Consult Note (Signed)
ANTICOAGULATION CONSULT NOTE -  Pharmacy Consult for Heparin Drip  Indication: pulmonary embolus  Patient Measurements: Height: 5\' 3"  (160 cm) Weight: 145 lb (65.8 kg) IBW/kg (Calculated) : 52.4  Labs: Recent Labs    03/04/18 0444 03/05/18 0643 03/05/18 1419  03/06/18 0407 03/06/18 0956 03/06/18 1612  HGB 8.7* 8.8*  --   --  9.5*  --   --   HCT 25.5* 25.4*  --   --  27.4*  --   --   PLT 171 145*  --   --  146*  --   --   APTT  --   --  31  --   --   --   --   LABPROT  --   --  14.3  --   --   --   --   INR  --   --  1.12  --   --   --   --   HEPARINUNFRC  --   --  0.10*   < > 0.71* 0.43 0.43  CREATININE 1.25*  --   --   --  1.54*  --   --    < > = values in this interval not displayed.    Assessment: Pharmacy consulted heparin dosing in 67 yo female with PE.  Patient has been receiving enoxaparin 40mg  every 24 hours while admitted. Last enoxaparin dose @1816  on 8/16.   Heparin infusing at 1000 units/hr 8/18 16:12 Heparin level 0.43  Goal of Therapy:  Heparin level 0.3-0.7 units/ml Monitor platelets by anticoagulation protocol: Yes   Plan:  Will continue with current rate. Next Heparin level with AM labs. Daily CBC while on Heparin drip.    Paulina Fusi, PharmD, BCPS 03/06/2018 4:52 PM

## 2018-03-06 NOTE — Progress Notes (Signed)
[facility] [place] NAME: Dana Ramirez    MR#:  [id number]  DATE OF BIRTH:  22-Mar-1951  SUBJECTIVE:   The patient feels better, better shortness of breath, no melena or bloody stool.  On heparin drip.  REVIEW OF SYSTEMS:    Review of Systems  Constitutional: Negative for chills and fever.  HENT: Negative for congestion and tinnitus.   Eyes: Negative for blurred vision and double vision.  Respiratory: Negative for cough and wheezing. Shortness of breath: improved.    Cardiovascular: Negative for chest pain, orthopnea and PND.  Gastrointestinal: Negative for abdominal pain, diarrhea, nausea and vomiting.  Genitourinary: Negative for dysuria and hematuria.  Neurological: Positive for weakness (Generalized. ). Negative for dizziness, sensory change and focal weakness.  All other systems reviewed and are negative.   Nutrition: Heart Healthy/Carb control Tolerating Diet: Yes Tolerating PT: Await Eval.   DRUG ALLERGIES:   Allergies  Allergen Reactions  . Codeine Nausea And Vomiting  . Lisinopril Cough  . Penicillins Itching    Has patient had a PCN reaction causing immediate rash, facial/tongue/throat swelling, SOB or lightheadedness with hypotension: no Has patient had a PCN reaction causing severe rash involving mucus membranes or skin necrosis: no Has patient had a PCN reaction that required hospitalization: no Has patient had a PCN reaction occurring within the last 10 years: no If all of the above answers are "NO", then may proceed with Cephalosporin use.     VITALS:  Blood pressure 138/80, pulse (!) 105, temperature (!) 97.4 F (36.3 C), temperature source Oral, resp. rate 16, height 5\' 3"  (1.6 m), weight 65.8 kg, SpO2 100 %.  PHYSICAL EXAMINATION:   Physical Exam  GENERAL:  67 y.o.-year-old patient lying in bed in NAD.    EYES: Pupils equal, round, reactive to light and accommodation. No scleral icterus. Extraocular  muscles intact.  HEENT: Head atraumatic, normocephalic. Oropharynx and nasopharynx clear.  NECK:  Supple, no jugular venous distention. No thyroid enlargement, no tenderness.  LUNGS: Diminished lung sounds on the right side, no wheezing, rales, rhonchi. No use of accessory muscles of respiration.  CARDIOVASCULAR: S1, S2 normal. No murmurs, rubs, or gallops.  ABDOMEN: Soft, nontender, nondistended. Bowel sounds present. No organomegaly or mass.  EXTREMITIES: No cyanosis, clubbing.  Bilateral leg edema. NEUROLOGIC: Cranial nerves II through XII are intact. No focal Motor or sensory deficits b/l.  Globally weak.  PSYCHIATRIC: The patient is alert and oriented x 3.  SKIN: No obvious rash, lesion, or ulcer.    LABORATORY PANEL:   CBC Recent Labs  Lab 03/06/18 0407  WBC 5.1  HGB 9.5*  HCT 27.4*  PLT 146*   ------------------------------------------------------------------------------------------------------------------  Chemistries  Recent Labs  Lab 03/01/18 0915  03/06/18 0407  NA 131*   < > 135  K 3.8   < > 3.6  CL 96*   < > 103  CO2 22   < > 24  GLUCOSE 321*   < > 180*  BUN 24*   < > 22  CREATININE 1.13*   < > 1.54*  CALCIUM 9.2   < > 8.1*  AST 19  --   --   ALT 17  --   --   ALKPHOS 87  --   --   BILITOT 0.5  --   --    < > = values in this interval not displayed.   ------------------------------------------------------------------------------------------------------------------  Cardiac Enzymes No results for input(s): TROPONINI in the last  168 hours. ------------------------------------------------------------------------------------------------------------------  RADIOLOGY:  Ct Angio Chest Pe W Or Wo Contrast  Result Date: 03/05/2018 CLINICAL DATA:  67 year old with worsening shortness of breath and chest discomfort. Right lung cancer. EXAM: CT ANGIOGRAPHY CHEST WITH CONTRAST TECHNIQUE: Multidetector CT imaging of the chest was performed using the standard  protocol during bolus administration of intravenous contrast. Multiplanar CT image reconstructions and MIPs were obtained to evaluate the vascular anatomy. CONTRAST:  109mL ISOVUE-370 IOPAMIDOL (ISOVUE-370) INJECTION 76% COMPARISON:  03/01/2018 FINDINGS: Cardiovascular: There is minimal blood flow in the main right pulmonary artery related to a large thrombus burden. There is a small amount of thrombus extending into the main pulmonary artery and near the origin of the left main pulmonary artery. Scattered emboli in the segmental branches of the left pulmonary arteries. There is no blood flow within the lobar and segmental right pulmonary arteries. Evidence of previous CABG procedure. No significant pericardial fluid. Mediastinum/Nodes: Again noted is large amount of soft tissue in the right hilum and right side of the mediastinum. The soft tissue is encompassing the right bronchus intermedius. This soft tissue mass may have enlarged in size measuring up to 6.2 cm on sequence 5 image 47 previously measuring roughly 3.9 cm. However, it is difficult to measure this tissue due to the adjacent volume loss in the right lung. Large amount of soft tissue in the right upper mediastinum measuring up to 3.7 cm in thickness on sequence 5, 28 and previously measured roughly 3.0 cm on the recent comparison examination. Probable small supraclavicular lymph nodes. Lungs/Pleura: Trachea is patent. Right upper lobe is patent. There is marked narrowing throughout the right bronchus intermedius. Markedly decreased aeration in the right lower lobe with extensive consolidation. The patient has developed a small right pleural effusion. There appears to be no aeration to the right middle lobe. Increased patchy parenchymal densities throughout the right upper lung. Densities along the medial left upper lobe probably related to atelectasis. Upper Abdomen: Again noted is a soft tissue nodule along the posterior right hemidiaphragm measuring  roughly 2.0 cm. Small peritoneal nodule in the left upper quadrant on image 76. There is persistent moderate left hydronephrosis. There is at least mild right hydronephrosis. Musculoskeletal: Again noted are enlarged lymph nodes in the left sub pectoralis region. Again noted is a left chest Port-A-Cath. The catheter tip is near the superior cavoatrial junction. Review of the MIP images confirms the above findings. IMPRESSION: Massive pulmonary embolism involving the main right pulmonary artery with complete occlusion of the distal right pulmonary artery and right pulmonary artery branches. Right pulmonary artery is adjacent to the known mediastinal tumor and tumor thrombus cannot be excluded. Positive for small pulmonary emboli within left pulmonary artery branches. Extensive volume loss throughout the right lung related to compression on the airways to the right lower lobe and right middle lobe. Aeration to the right lower lobe has markedly decreased compared 03/01/2018 and patient has developed a small right pleural effusion. In addition, there is now patchy disease throughout the right upper lung. Soft tissue masses in the mediastinum and right hilum may have enlarged since 03/01/18 but difficult to accurately measure due to the extensive volume loss in the right hemithorax. Again noted is metastatic disease in the upper abdomen and left sub pectoralis region. Again noted is bilateral hydronephrosis, left side greater than right. Critical Value/emergent results were called by telephone at the time of interpretation on 03/05/2018 at 11:42 am to Dr. Demetrios Loll , who verbally acknowledged these results. Electronically  Signed   By: Markus Daft M.D.   On: 03/05/2018 11:56   US Venous Img Lower Bilateral  Result Date: 03/05/2018 CLINICAL DATA:  67 year old with lung cancer and pulmonary embolism. EXAM: BILATERAL LOWER EXTREMITY VENOUS DOPPLER ULTRASOUND TECHNIQUE: Gray-scale sonography with graded compression, as well  as color Doppler and duplex ultrasound were performed to evaluate the lower extremity deep venous systems from the level of the common femoral vein and including the common femoral, femoral, profunda femoral, popliteal and calf veins including the posterior tibial, peroneal and gastrocnemius veins when visible. The superficial great saphenous vein was also interrogated. Spectral Doppler was utilized to evaluate flow at rest and with distal augmentation maneuvers in the common femoral, femoral and popliteal veins. COMPARISON:  None. FINDINGS: RIGHT LOWER EXTREMITY Common Femoral Vein: No evidence of thrombus. Normal compressibility, respiratory phasicity and response to augmentation. Saphenofemoral Junction: No evidence of thrombus. Normal compressibility and flow on color Doppler imaging. Profunda Femoral Vein: No evidence of thrombus. Normal compressibility and flow on color Doppler imaging. Femoral Vein: No evidence of thrombus. Normal compressibility, respiratory phasicity and response to augmentation. Popliteal Vein: No evidence of thrombus. Normal compressibility, respiratory phasicity and response to augmentation. Calf Veins: Visualized right deep calf veins are patent without thrombus. Limited evaluation of the peroneal veins. LEFT LOWER EXTREMITY Common Femoral Vein: No evidence of thrombus. Normal compressibility, respiratory phasicity and response to augmentation. Saphenofemoral Junction: No evidence of thrombus. Normal compressibility and flow on color Doppler imaging. Profunda Femoral Vein: No evidence of thrombus. Normal compressibility and flow on color Doppler imaging. Femoral Vein: No evidence of thrombus. Normal compressibility, respiratory phasicity and response to augmentation. Popliteal Vein: No evidence of thrombus. Normal compressibility, respiratory phasicity and response to augmentation. Calf Veins: Visualized left deep calf veins are patent without thrombus. Limited evaluation of the  peroneal veins. IMPRESSION: No evidence of deep venous thrombosis in the lower extremities. Electronically Signed   By: Markus Daft M.D.   On: 03/05/2018 18:24     ASSESSMENT AND PLAN:   67 year old female with past medical history of non-small cell lung cancer, diabetes, hypertension, hyperlipidemia, history of nephrolithiasis, carotid artery disease who presented to the hospital due to shortness of breath, chest pain and cough.  1.  Pneumonia-suspected to be healthcare associated pneumonia. -Much improved since yesterday.  Patient is afebrile, hemodynamically stable, shortness of breath is improved. Discontinue IV cefepime and switch to oral levaquin.  2. Sepsis- suspected to be secondary to pneumonia. Discontinue IV cefepime and switch to oral levaquin.  Follow cultures which are currently negative.    3.  Left flank pain/hydronephrosis- patient had some mild left flank pain and ultrasound showed mild left hydronephrosis.  Seen by urology and given mild acute kidney injury they were considering stent versus percutaneous nephrostomy tube, although patient's creatinine has improved and she has no acute abdominal pain and is urinating well and therefore no plans for intervention.  Continue supportive care for now. - Patient to have repeat ultrasound done in the next 2 weeks.  4.  Acute kidney injury- secondary to dehydration/nephrolithiasis/ intrinsic compression from underlying malignancy. -Improved with IV fluids, seen by urology no plans for intervention as mentioned above.   But creatinine up to 1.54 today, possible due to angiogram with contrast.  Continue IV fluid and follow-up BMP.  5.  High Grade non-small cell lung cancer with recurrence-seen by oncology.  Patient is status post chemoradiation but has now worsening recurrence.   Patient's case discussed at tumor board, discussed  with oncologist and plan on starting Keytruda as an outpatient next week once patient recovers from her  pneumonia.  6.  Essential hypertension-continue Toprol.  7.  Hyperlipidemia-continue Crestor.  8.  Anxiety-continue Ativan as needed.  Acute bilateral pulmonary embolism. Massive pulmonary embolism involving the main right pulmonary artery with complete occlusion of the distal right pulmonary artery and right pulmonary artery branches. Right pulmonary artery is adjacent to the known mediastinal tumor and tumor thrombus cannot be Excluded. Positive for small pulmonary emboli within left pulmonary artery Branches. Venous duplex did not show DVT. Continue heparin drip, may change to p.o. anticoagulation or Lovenox on discharge.  Follow-up Dr. Tasia Catchings for further recommendation.  Bloody stool last night.  Stool occult is negative.  High risk for endoscopy per Dr. Vicente Males.  Very poor prognosis. All the records are reviewed and case discussed with Care Management/Social Worker. Management plans discussed with the patient, her husband, son and daughter and they are in agreement.  CODE STATUS: DNR  TOTAL TIME TAKING CARE OF THIS PATIENT: 38 minutes.   POSSIBLE D/C IN 2 DAYS, DEPENDING ON CLINICAL CONDITION.   Demetrios Loll M.D on 03/06/2018 at 12:35 PM  Between 7am to 6pm - Pager - [phone]  After 6pm go to [email or web address] - Proofreader  [facility]  Office  [phone]  CC: Primary care physician; Crecencio Mc, MD

## 2018-03-06 NOTE — Consult Note (Signed)
ANTICOAGULATION CONSULT NOTE -  Pharmacy Consult for Heparin Drip  Indication: pulmonary embolus  Patient Measurements: Height: 5\' 3"  (160 cm) Weight: 145 lb (65.8 kg) IBW/kg (Calculated) : 52.4  Labs: Recent Labs    03/04/18 0444 03/05/18 0643  03/05/18 1419 03/05/18 2100 03/06/18 0407 03/06/18 0956  HGB 8.7* 8.8*  --   --   --  9.5*  --   HCT 25.5* 25.4*  --   --   --  27.4*  --   PLT 171 145*  --   --   --  146*  --   APTT  --   --   --  31  --   --   --   LABPROT  --   --   --  14.3  --   --   --   INR  --   --   --  1.12  --   --   --   HEPARINUNFRC  --   --    < > 0.10* 0.46 0.71* 0.43  CREATININE 1.25*  --   --   --   --  1.54*  --    < > = values in this interval not displayed.    Assessment: Pharmacy consulted heparin dosing in 67 yo female with PE.  Patient has been receiving enoxaparin 40mg  every 24 hours while admitted. Last enoxaparin dose @1816  on 8/16.   8/17 Hgb: 8.8, Hct: 25  Plts: 145 Currently on Heparin 1100 units/hr, 21:00 Heparin level resulted at 0.46.  Goal of Therapy:  Heparin level 0.3-0.7 units/ml Monitor platelets by anticoagulation protocol: Yes   Plan:  08/18 @ 0400 HL 0.71 slightly supratherapeutic. Will decrease down to 1000 units/hr and will recheck next anti-Xa @ 1000. CBC stable.   08/18 ~ 10:00  HL = 0.43.  Continue current drip rate. Recheck today at 16:00.    Olivia Canter, [facility] Clinical Pharmacist 03/06/2018

## 2018-03-07 ENCOUNTER — Ambulatory Visit: Payer: Medicare Other

## 2018-03-07 ENCOUNTER — Telehealth: Payer: Self-pay | Admitting: *Deleted

## 2018-03-07 ENCOUNTER — Encounter: Payer: Self-pay | Admitting: *Deleted

## 2018-03-07 DIAGNOSIS — N1330 Unspecified hydronephrosis: Secondary | ICD-10-CM

## 2018-03-07 DIAGNOSIS — I2699 Other pulmonary embolism without acute cor pulmonale: Secondary | ICD-10-CM

## 2018-03-07 DIAGNOSIS — R0602 Shortness of breath: Secondary | ICD-10-CM

## 2018-03-07 DIAGNOSIS — R110 Nausea: Secondary | ICD-10-CM

## 2018-03-07 LAB — BASIC METABOLIC PANEL
Anion gap: 6 (ref 5–15)
BUN: 24 mg/dL — AB (ref 8–23)
CALCIUM: 8.2 mg/dL — AB (ref 8.9–10.3)
CO2: 25 mmol/L (ref 22–32)
Chloride: 105 mmol/L (ref 98–111)
Creatinine, Ser: 1.68 mg/dL — ABNORMAL HIGH (ref 0.44–1.00)
GFR calc Af Amer: 36 mL/min — ABNORMAL LOW (ref >60)
GFR, EST NON AFRICAN AMERICAN: 31 mL/min — AB (ref >60)
GLUCOSE: 159 mg/dL — AB (ref 70–99)
Potassium: 3.3 mmol/L — ABNORMAL LOW (ref 3.5–5.1)
SODIUM: 136 mmol/L (ref 135–145)

## 2018-03-07 LAB — GLUCOSE, CAPILLARY
GLUCOSE-CAPILLARY: 149 mg/dL — AB (ref 70–99)
GLUCOSE-CAPILLARY: 269 mg/dL — AB (ref 70–99)
Glucose-Capillary: 217 mg/dL — ABNORMAL HIGH (ref 70–99)
Glucose-Capillary: 288 mg/dL — ABNORMAL HIGH (ref 70–99)
Glucose-Capillary: 174 mg/dL — ABNORMAL HIGH (ref 70–99)

## 2018-03-07 LAB — CBC
HCT: 26.9 % — ABNORMAL LOW (ref 35.0–47.0)
Hemoglobin: 9.2 g/dL — ABNORMAL LOW (ref 12.0–16.0)
MCH: 32.4 pg (ref 26.0–34.0)
MCHC: 34.3 g/dL (ref 32.0–36.0)
MCV: 94.6 fL (ref 80.0–100.0)
PLATELETS: 159 10*3/uL (ref 150–440)
RBC: 2.85 MIL/uL — ABNORMAL LOW (ref 3.80–5.20)
RDW: 19.4 % — ABNORMAL HIGH (ref 11.5–14.5)
WBC: 5.3 10*3/uL (ref 3.6–11.0)

## 2018-03-07 LAB — HEPARIN LEVEL (UNFRACTIONATED): Heparin Unfractionated: 0.48 IU/mL (ref 0.30–0.70)

## 2018-03-07 MED ORDER — PROMETHAZINE HCL 25 MG/ML IJ SOLN
12.5000 mg | Freq: Four times a day (QID) | INTRAMUSCULAR | Status: DC | PRN
  Filled 2018-03-07: qty 1

## 2018-03-07 MED ORDER — MORPHINE SULFATE (CONCENTRATE) 10 MG/0.5ML PO SOLN
5.0000 mg | Freq: Once | ORAL | Status: AC
  Administered 2018-03-07: 5 mg via ORAL
  Filled 2018-03-07: qty 1

## 2018-03-07 MED ORDER — ACETAMINOPHEN 325 MG PO TABS
650.0000 mg | ORAL_TABLET | Freq: Four times a day (QID) | ORAL | Status: DC | PRN
  Administered 2018-03-08 – 2018-03-10 (×5): 650 mg via ORAL
  Filled 2018-03-07 (×5): qty 2

## 2018-03-07 MED ORDER — SODIUM CHLORIDE 0.9 % IV SOLN
INTRAVENOUS | Status: DC
  Administered 2018-03-07 – 2018-03-08 (×2): via INTRAVENOUS
  Filled 2018-03-07: qty 1000

## 2018-03-07 MED ORDER — POTASSIUM CHLORIDE CRYS ER 20 MEQ PO TBCR
40.0000 meq | EXTENDED_RELEASE_TABLET | Freq: Two times a day (BID) | ORAL | Status: AC
  Administered 2018-03-07 (×2): 40 meq via ORAL
  Filled 2018-03-07 (×2): qty 2

## 2018-03-07 MED ORDER — MORPHINE SULFATE (CONCENTRATE) 10 MG/0.5ML PO SOLN
5.0000 mg | ORAL | Status: DC | PRN
  Administered 2018-03-07 (×2): 5 mg via ORAL
  Filled 2018-03-07 (×2): qty 1

## 2018-03-07 MED ORDER — MORPHINE SULFATE (CONCENTRATE) 10 MG/0.5ML PO SOLN
5.0000 mg | ORAL | Status: DC | PRN
  Administered 2018-03-08 – 2018-03-10 (×4): 5 mg via ORAL
  Filled 2018-03-07 (×4): qty 1

## 2018-03-07 NOTE — Telephone Encounter (Signed)
She is currently admitted.

## 2018-03-07 NOTE — Care Management (Signed)
Home health/private sitter list given to daughter. Patient active with [facility]. Will have [facility] resume home health, RN, PT, OT and HHA at DC.

## 2018-03-07 NOTE — Plan of Care (Signed)
Care plane reviewed with pt and family'

## 2018-03-07 NOTE — Consult Note (Signed)
ANTICOAGULATION CONSULT NOTE -  Pharmacy Consult for Heparin Drip  Indication: pulmonary embolus  Patient Measurements: Height: 5\' 3"  (160 cm) Weight: 145 lb (65.8 kg) IBW/kg (Calculated) : 52.4  Labs: Recent Labs    03/05/18 0643 03/05/18 1419  03/06/18 0407 03/06/18 0956 03/06/18 1612 03/07/18 0514  HGB 8.8*  --   --  9.5*  --   --  9.2*  HCT 25.4*  --   --  27.4*  --   --  26.9*  PLT 145*  --   --  146*  --   --  159  APTT  --  31  --   --   --   --   --   LABPROT  --  14.3  --   --   --   --   --   INR  --  1.12  --   --   --   --   --   HEPARINUNFRC  --  0.10*   < > 0.71* 0.43 0.43 0.48  CREATININE  --   --   --  1.54*  --   --  1.68*   < > = values in this interval not displayed.    Assessment: Pharmacy consulted heparin dosing in 67 yo female with PE.  Patient has been receiving enoxaparin 40mg  every 24 hours while admitted. Last enoxaparin dose @1816  on 8/16.   Heparin infusing at 1000 units/hr 8/18 16:12 Heparin level 0.43  Goal of Therapy:  Heparin level 0.3-0.7 units/ml Monitor platelets by anticoagulation protocol: Yes   Plan:  08/19 @ 0500 HL 0.48 therapeutic. Will continue current rate and will recheck HL w/ am labs. hgb trending down slightly but stable.  Tobie Lords, PharmD, BCPS Clinical Pharmacist 03/07/2018

## 2018-03-07 NOTE — Progress Notes (Addendum)
Inpatient Diabetes Program Recommendations  AACE/ADA: New Consensus Statement on Inpatient Glycemic Control (2019)  Target Ranges:  Prepandial:   less than 140 mg/dL      Peak postprandial:   less than 180 mg/dL (1-2 hours)      Critically ill patients:  140 - 180 mg/dL  Results for Dana Ramirez, Dana Ramirez" (MRN [id number]) as of 03/07/2018 10:30  Ref. Range 03/06/2018 07:40 03/06/2018 11:28 03/06/2018 17:09 03/06/2018 21:26 03/06/2018 23:21 03/07/2018 07:32  Glucose-Capillary Latest Ref Range: 70 - 99 mg/dL 179 (H) 315 (H) 172 (H) 218 (H) 217 (H) 149 (H)    Review of Glycemic Control  Diabetes history: DM2 Outpatient Diabetes medications: Januvia 100 mg daily, Metformin 1000 mg QPM Current orders for Inpatient glycemic control: Lantus 10 units daily, Novolog 0-9 units TID with meals, Novolog 0-5 units QHS; Prednisone 5 mg QAM  Inpatient Diabetes Program Recommendations: Correction (SSI): Please consider increasing Novolog correction to Moderate scale (Novolog 0-15 units) TID with meals.  Thanks, Barnie Alderman, RN, MSN, CDE Diabetes Coordinator Inpatient Diabetes Program [phone] (Team Pager from 8am to 5pm)

## 2018-03-07 NOTE — Progress Notes (Signed)
OT Cancellation Note  Patient Details Name: Naziah Portee MRN: [id number] DOB: 01/21/1951   Cancelled Treatment:    Reason Eval/Treat Not Completed: Medical issues which prohibited therapy. Pt noted to have a PE over the weekend. Per therapy protocol will hold pt 48 hrs from time of initial heparin dose after PE. Will re-attempt OT treatment at later date/time (after 03/07/18, 2:50pm) as medically appropriate.   Jeni Salles, MPH, MS, OTR/L ascom [phone] 03/07/18, 8:40 AM

## 2018-03-07 NOTE — Telephone Encounter (Signed)
Christy informed that we will wait until patient discharged for orders

## 2018-03-07 NOTE — Plan of Care (Signed)

## 2018-03-07 NOTE — Consult Note (Signed)
ANTICOAGULATION CONSULT NOTE -  Pharmacy Consult for Heparin Drip  Indication: pulmonary embolus  Patient Measurements: Height: 5\' 3"  (160 cm) Weight: 145 lb (65.8 kg) IBW/kg (Calculated) : 52.4  Labs: Recent Labs    03/05/18 0643 03/05/18 1419  03/06/18 0407 03/06/18 0956 03/06/18 1612 03/07/18 0514  HGB 8.8*  --   --  9.5*  --   --  9.2*  HCT 25.4*  --   --  27.4*  --   --  26.9*  PLT 145*  --   --  146*  --   --  159  APTT  --  31  --   --   --   --   --   LABPROT  --  14.3  --   --   --   --   --   INR  --  1.12  --   --   --   --   --   HEPARINUNFRC  --  0.10*   < > 0.71* 0.43 0.43 0.48  CREATININE  --   --   --  1.54*  --   --  1.68*   < > = values in this interval not displayed.    Assessment: Pharmacy consulted heparin dosing in 67 yo female with PE.  Patient has been receiving enoxaparin 40mg  every 24 hours while admitted. Last enoxaparin dose @1816  on 8/16.   Heparin infusing at 1000 units/hr 8/18 16:12 Heparin level 0.43  Goal of Therapy:  Heparin level 0.3-0.7 units/ml Monitor platelets by anticoagulation protocol: Yes   Plan:  08/19 @ 0500 HL 0.48 therapeutic. Will continue current rate and will recheck HL w/ am labs. hgb trending down slightly but stable.   Paticia Stack, PharmD Pharmacy Resident  03/07/2018 8:06 AM

## 2018-03-07 NOTE — Progress Notes (Signed)
PT Cancellation Note  Patient Details Name: Dana Ramirez MRN: [id number] DOB: 1951-06-28   Cancelled Treatment:      Chart reviewed. Pt noted to have a PE over the weekend. Per therapy protocol will hold pt 48 hrs from time of initial heparin dose after PE. Will re-attempt PT treatment at later date/time (after 03/07/18, 2:50pm) as medically appropriate.   Yolonda Kida, SPT   Vanessa Kampf 03/07/2018, 9:21 AM

## 2018-03-07 NOTE — Care Management Important Message (Signed)
Important Message  Patient Details  Name: Dana Ramirez MRN: [id number] Date of Birth: 1950-10-23   Medicare Important Message Given:  Yes    Juliann Pulse A Velena Keegan 03/07/2018, 11:40 AM

## 2018-03-07 NOTE — Progress Notes (Signed)
Hematology/Oncology Progress Note [facility] Telephone:([phone] [phone]  Patient Care Team: Crecencio Mc, MD as PCP - General (Internal Medicine) Minna Merritts, MD (Cardiology) Telford Nab, RN as Registered Nurse Bary Castilla, Forest Gleason, MD (General Surgery) Earlie Server, MD as Medical Oncologist (Medical Oncology)   Name of the patient: Dana Ramirez  [id number]  April 30, 1951  Date of visit: 03/07/18   INTERVAL HISTORY-  Patient developed right massive PE over the weekend.  She was started on heparin drip for anticoagulation for the treatment of PE. She was also evaluated by vascular surgeon. no intervention was recommended. Today patient was seen and examined.  She is sitting in the chair.  Daughter and son at the bedside. Patient appears weak, does not talk much.  Per daughter patient continued to feel shortness of breath, extremely weak. Also had some postnasal drip. Appetite is fair.  Review of systems- Review of Systems  Constitutional: Positive for malaise/fatigue. Negative for chills, fever and weight loss.  HENT: Negative for nosebleeds and sore throat.   Eyes: Negative for double vision, photophobia and redness.  Respiratory: Positive for shortness of breath. Negative for cough and wheezing.   Cardiovascular: Negative for chest pain, palpitations and orthopnea.  Gastrointestinal: Negative for abdominal pain, blood in stool, heartburn, nausea and vomiting.  Genitourinary: Negative for dysuria.  Musculoskeletal: Negative for back pain, myalgias and neck pain.  Skin: Negative for itching and rash.  Neurological: Positive for weakness. Negative for dizziness, tingling and tremors.  Endo/Heme/Allergies: Negative for environmental allergies. Does not bruise/bleed easily.  Psychiatric/Behavioral: Negative for depression and suicidal ideas.    Allergies  Allergen Reactions  . Codeine Nausea And Vomiting  . Lisinopril Cough  .  Penicillins Itching    Has patient had a PCN reaction causing immediate rash, facial/tongue/throat swelling, SOB or lightheadedness with hypotension: no Has patient had a PCN reaction causing severe rash involving mucus membranes or skin necrosis: no Has patient had a PCN reaction that required hospitalization: no Has patient had a PCN reaction occurring within the last 10 years: no If all of the above answers are "NO", then may proceed with Cephalosporin use.     Patient Active Problem List   Diagnosis Date Noted  . AKI (acute kidney injury) ([place])   . Hydronephrosis, left   . Pneumonia 03/01/2018  . Supraclavicular lymphadenopathy 01/26/2018  . Goals of care, counseling/discussion 01/07/2018  . Lung cancer ([place]) 01/06/2018  . Malignant neoplasm of overlapping sites of right lung ([place]) 12/28/2017  . Wheezing 12/21/2017  . Mediastinal lymphadenopathy 12/21/2017  . Neck mass 12/08/2017  . Cervical radiculopathy at C8 11/06/2017  . GERD (gastroesophageal reflux disease) 04/10/2017  . 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 B12 deficiency 04/02/2016  . Cough in adult patient 12/28/2015  . Carpal tunnel syndrome 06/03/2015  . Atherosclerosis of coronary artery 06/03/2015  . Hypertension 06/03/2015  . Subclinical hypothyroidism 06/03/2015  . Diabetes mellitus without complication ([place]) 76/19/5093  . Carotid stenosis 12/21/2012  . Hyperlipidemia 12/19/2009  . Atherosclerosis of native coronary artery of native heart with stable angina pectoris ([place]) 12/19/2009     Past Medical History:  Diagnosis Date  . CAD (coronary artery disease)   . Carotid arterial disease ([place])   . Diabetes mellitus without complication ([place])   . History of vertigo   . Hyperlipidemia   . Hypertension   . Kidney stone      Past Surgical History:  Procedure Laterality Date  . BREAST CYST ASPIRATION Left  1980s  . CESAREAN SECTION     x 2  . CHOLECYSTECTOMY    . CORONARY ARTERY BYPASS GRAFT  03/2005   LIMA to the  LAD, vein graft to diagonal with a negative stress test in 06/2006  . PORTACATH PLACEMENT Left 12/31/2017   Procedure: INSERTION PORT-A-CATH;  Surgeon: Robert Bellow, MD;  Location: [facility] ORS;  Service: General;  Laterality: Left;  . SENTINEL NODE BIOPSY Right 12/31/2017   Procedure: SUPRACLAVICULAR NODE BIOPSY;  Surgeon: Robert Bellow, MD;  Location: [facility] ORS;  Service: General;  Laterality: Right;  . TONSILLECTOMY      Social History   Socioeconomic History  . Marital status: Married    Spouse name: Elenore Rota   . Number of children: 2  . Years of education: Not on file  . Highest education level: Not on file  Occupational History  . Occupation: Retired     Fish farm manager: OTHER    Comment: [employer]  Social Needs  . Financial resource strain: Not on file  . Food insecurity:    Worry: Not on file    Inability: Not on file  . Transportation needs:    Medical: Not on file    Non-medical: Not on file  Tobacco Use  . Smoking status: Never Smoker  . Smokeless tobacco: Never Used  Substance and Sexual Activity  . Alcohol use: No  . Drug use: No  . Sexual activity: Not on file  Lifestyle  . Physical activity:    Days per week: Not on file    Minutes per session: Not on file  . Stress: Not on file  Relationships  . Social connections:    Talks on phone: Not on file    Gets together: Not on file    Attends religious service: Not on file    Active member of club or organization: Not on file    Attends meetings of clubs or organizations: Not on file    Relationship status: Not on file  . Intimate partner violence:    Fear of current or ex partner: Not on file    Emotionally abused: Not on file    Physically abused: Not on file    Forced sexual activity: Not on file  Other Topics Concern  . Not on file  Social History Narrative   Married   Gets regular exercise, once or twice every week     Family History  Problem Relation Age of Onset  . Heart disease Mother   . Heart  disease Father 47  . Stroke Sister 69  . Heart disease Sister   . Diabetes Brother   . Coronary artery disease Other        Strong family history of CAD and CABG  . Heart disease Maternal Grandmother   . Breast cancer Maternal Grandmother   . Heart disease Maternal Grandfather   . Heart disease Paternal Grandmother   . Breast cancer Paternal Grandmother 22  . Heart disease Paternal Grandfather      Current Facility-Administered Medications:  .  acetaminophen (TYLENOL) tablet 650 mg, 650 mg, Oral, Q6H PRN, Vaughan Basta, MD, 650 mg at 03/07/18 0143 .  albuterol (PROVENTIL) (2.5 MG/3ML) 0.083% nebulizer solution 3 mL, 3 mL, Inhalation, Q6H PRN, Vaughan Basta, MD, 3 mL at 03/04/18 1922 .  aspirin EC tablet 81 mg, 81 mg, Oral, BID, Vaughan Basta, MD, 81 mg at 03/07/18 0934 .  benzonatate (TESSALON) capsule 200 mg, 200 mg, Oral, Q6H PRN, Saundra Shelling, MD, 200  mg at 03/07/18 1041 .  chlorpheniramine-HYDROcodone (TUSSIONEX) 10-8 MG/5ML suspension 5 mL, 5 mL, Oral, Q12H PRN, Vaughan Basta, MD, 5 mL at 03/07/18 0446 .  cholecalciferol (VITAMIN D) tablet 1,000 Units, 1,000 Units, Oral, Daily, Vaughan Basta, MD, 1,000 Units at 03/07/18 0933 .  docusate sodium (COLACE) capsule 100 mg, 100 mg, Oral, BID PRN, Vaughan Basta, MD .  docusate sodium (COLACE) capsule 100 mg, 100 mg, Oral, BID, Vaughan Basta, MD, 100 mg at 03/07/18 0934 .  guaiFENesin-dextromethorphan (ROBITUSSIN DM) 100-10 MG/5ML syrup 5 mL, 5 mL, Oral, Q4H PRN, Demetrios Loll, MD, 5 mL at 03/07/18 0143 .  heparin ADULT infusion 100 units/mL (25000 units/298mL sodium chloride 0.45%), 1,000 Units/hr, Intravenous, Continuous, Demetrios Loll, MD, Last Rate: 10 mL/hr at 03/07/18 0600, 1,000 Units/hr at 03/07/18 0600 .  insulin aspart (novoLOG) injection 0-9 Units, 0-9 Units, Subcutaneous, TID AC & HS, Lance Coon, MD, 1 Units at 03/07/18 0800 .  insulin glargine (LANTUS) injection 10  Units, 10 Units, Subcutaneous, Daily, Demetrios Loll, MD, 10 Units at 03/07/18 [phone] .  ipratropium-albuterol (DUONEB) 0.5-2.5 (3) MG/3ML nebulizer solution 3 mL, 3 mL, Nebulization, Once, Sridharan, Prasanna, MD .  lactulose ([place]) 10 GM/15ML solution 30 g, 30 g, Oral, BID PRN, Henreitta Leber, MD, 30 g at 03/04/18 1822 .  levofloxacin (LEVAQUIN) tablet 750 mg, 750 mg, Oral, Q48H, Demetrios Loll, MD, 750 mg at 03/07/18 0934 .  LORazepam (ATIVAN) injection 0.5 mg, 0.5 mg, Intravenous, Q8H PRN, Amelia Jo, MD, 0.5 mg at 03/05/18 0836 .  LORazepam (ATIVAN) tablet 0.5 mg, 0.5 mg, Oral, Q8H PRN, Vaughan Basta, MD, 0.5 mg at 03/07/18 1212 .  MEDLINE mouth rinse, 15 mL, Mouth Rinse, BID, Sainani, Belia Heman, MD, 15 mL at 03/06/18 2332 .  metoprolol succinate (TOPROL-XL) 24 hr tablet 25 mg, 25 mg, Oral, Daily, Vaughan Basta, MD, 25 mg at 03/07/18 0934 .  morphine 10 MG/5ML solution 5 mg, 5 mg, Oral, Q4H PRN, Earlie Server, MD .  morphine 2 MG/ML injection 2 mg, 2 mg, Intravenous, Q4H PRN, Pyreddy, Pavan, MD .  ondansetron (ZOFRAN) injection 4 mg, 4 mg, Intravenous, Q6H PRN, Vaughan Basta, MD, 4 mg at 03/07/18 1121 .  pantoprazole (PROTONIX) EC tablet 40 mg, 40 mg, Oral, Daily, Vaughan Basta, MD, 40 mg at 03/07/18 0935 .  potassium chloride SA (K-DUR,KLOR-CON) CR tablet 40 mEq, 40 mEq, Oral, BID, Mayo, Pete Pelt, MD, 40 mEq at 03/07/18 0935 .  predniSONE (DELTASONE) tablet 5 mg, 5 mg, Oral, Q breakfast, Vaughan Basta, MD, 5 mg at 03/07/18 0934 .  rosuvastatin (CRESTOR) tablet 20 mg, 20 mg, Oral, QPM, Vaughan Basta, MD, 20 mg at 03/06/18 1745 .  sodium chloride 0.9 % 1,000 mL infusion, , Intravenous, Continuous, Mayo, Pete Pelt, MD .  sodium chloride flush (NS) 0.9 % injection 10-40 mL, 10-40 mL, Intracatheter, PRN, Henreitta Leber, MD .  traMADol (ULTRAM) tablet 50 mg, 50 mg, Oral, Q6H PRN, Pyreddy, Pavan, MD, 50 mg at 03/06/18 2050 .  vitamin B-12  (CYANOCOBALAMIN) tablet 1,000 mcg, 1,000 mcg, Oral, Daily, Vaughan Basta, MD, 1,000 mcg at 03/07/18 0934   Physical exam:  Vitals:   03/06/18 1308 03/06/18 2105 03/07/18 0441 03/07/18 0931  BP: 109/85 140/71 (!) 149/85 119/63  Pulse: 93 (!) 115 (!) 112 (!) 102  Resp: 20  18 20   Temp: 98.6 F (37 C)  98.2 F (36.8 C) 97.8 F (36.6 C)  TempSrc: Oral  Oral Oral  SpO2: 95%  100% 100%  Weight:  Height:       Physical Exam  Constitutional: She is oriented to person, place, and time and well-developed, well-nourished, and in no distress. No distress.  HENT:  Head: Normocephalic and atraumatic.  Mouth/Throat: No oropharyngeal exudate.  Eyes: Pupils are equal, round, and reactive to light. EOM are normal. Left eye exhibits no discharge. No scleral icterus.  Neck: Normal range of motion. Neck supple.  Cardiovascular: Normal rate and regular rhythm.  No murmur heard. Pulmonary/Chest: Effort normal. She has no wheezes. She has no rales.  Abdominal: Soft. Bowel sounds are normal. She exhibits no distension and no mass. There is no tenderness.  Musculoskeletal: Normal range of motion. She exhibits edema.  Neurological: She is alert and oriented to person, place, and time. No cranial nerve deficit. She exhibits normal muscle tone. Coordination normal.  Skin: Skin is warm and dry. She is not diaphoretic. No erythema.  Psychiatric: Affect normal.       CMP Latest Ref Rng & Units 03/07/2018  Glucose 70 - 99 mg/dL 159(H)  BUN 8 - 23 mg/dL 24(H)  Creatinine 0.44 - 1.00 mg/dL 1.68(H)  Sodium 135 - 145 mmol/L 136  Potassium 3.5 - 5.1 mmol/L 3.3(L)  Chloride 98 - 111 mmol/L 105  CO2 22 - 32 mmol/L 25  Calcium 8.9 - 10.3 mg/dL 8.2(L)  Total Protein 6.5 - 8.1 g/dL -  Total Bilirubin 0.3 - 1.2 mg/dL -  Alkaline Phos 38 - 126 U/L -  AST 15 - 41 U/L -  ALT 0 - 44 U/L -   CBC Latest Ref Rng & Units 03/07/2018  WBC 3.6 - 11.0 K/uL 5.3  Hemoglobin 12.0 - 16.0 g/dL 9.2(L)    Hematocrit 35.0 - 47.0 % 26.9(L)  Platelets 150 - 440 K/uL 159   RADIOGRAPHIC STUDIES: I have personally reviewed the radiological images as listed and agreed with the findings in the report. Ct Abdomen Pelvis Wo Contrast  Result Date: 03/01/2018 CLINICAL DATA:  History of non small cell lung carcinoma with fevers and nausea EXAM: CT ABDOMEN AND PELVIS WITHOUT CONTRAST TECHNIQUE: Multidetector CT imaging of the abdomen and pelvis was performed following the standard protocol without IV contrast. COMPARISON:  12/24/2017 PET-CT FINDINGS: Lower chest: Chronic changes are noted in the right lower lobe stable from the prior PET-CT is well as a CT examination from 12/29/2014. Some slight increased interstitial changes are noted which may be related to lymphangitic spread of carcinoma. The left lung base is within normal limits. Along the inferior aspect of the right hemidiaphragm posteriorly there is a 1.8 cm soft tissue nodule best seen on image number 21 of series 2. When compare with the prior PET-CT this was not present and likely represents a metastatic focus. Hepatobiliary: No focal liver abnormality is seen. Status post cholecystectomy. No biliary dilatation. Pancreas: Unremarkable. No pancreatic ductal dilatation or surrounding inflammatory changes. Spleen: Normal in size without focal abnormality. Adrenals/Urinary Tract: Adrenal glands are within normal limits. Bladder is decompressed. Kidneys are well visualized bilaterally. Some mild hydronephrotic changes are noted on the left which extend inferiorly into the mid ureter. No definitive stone is seen although some soft tissue thickening of the ureter is noted. Possibility of underlying mass lesion cannot be totally excluded. The bladder is partially distended. Stomach/Bowel: Scattered diverticular change of the colon is noted without evidence of diverticulitis. The appendix is within normal limits. No small bowel abnormality is seen. Vascular/Lymphatic:  Aortic atherosclerosis. No enlarged abdominal or pelvic lymph nodes. Reproductive: Uterus and bilateral adnexa  are unremarkable. Other: No abdominal wall hernia or abnormality. No abdominopelvic ascites. Musculoskeletal: Degenerative changes of the lumbar spine are seen. IMPRESSION: Changes in the right lung base predominately chronic in nature although some changes consistent with lymphangitic spread of carcinoma are present. These are new from the prior PET-CT. Nodule along the right hemidiaphragm posteriorly likely representing a metastatic deposit. Obstructive changes of the left renal collecting system and proximal ureter. Some thickening of the ureter is seen which may represent a focal lesion. Clinical correlation is recommended. Although not mentioned in the body of the report there are 2 peritoneal soft tissue lesions identified. The largest of these lies on image number 49 of series 2 measuring approximately 18 mm in dimension. This may also represent some metastatic disease. Repeat PET-CT when the patient's condition improves may be helpful. Electronically Signed   By: Inez Catalina M.D.   On: 03/01/2018 21:12   Ct Chest Wo Contrast  Result Date: 03/01/2018 CLINICAL DATA:  Cough and shortness of breath beginning last night. The patient is undergoing chemotherapy and radiation therapy for lung carcinoma. EXAM: CT CHEST WITHOUT CONTRAST TECHNIQUE: Multidetector CT imaging of the chest was performed following the standard protocol without IV contrast. COMPARISON:  PET CT scan 12/24/2017. FINDINGS: Cardiovascular: The patient is status post CABG. Heart size is normal. Calcific aortic atherosclerosis is identified. No pericardial effusion. Mediastinum/Nodes: Previously seen 1.5 cm short axis dimension right supraclavicular node measures 0.8 cm on image 5 of series 2 today. Right suprahilar mass seen on the prior examination measures 4.4 x 2.7 cm today on image 32 compared to 4.9 x 3.6 cm on the prior study  (remeasurement). The lesion abuts the right subclavian artery as on the prior exam. A right infrahilar mass lesion encasing the right mainstem bronchus measures 3.9 x 3.9 cm on image 67 compared to 4.3 x 4.3 cm on the prior exam. There is less mass effect on the right mainstem bronchus. The thyroid gland and esophagus are unremarkable. Lungs/Pleura: No pleural effusion. Thickened pleura and pleural calcification in the right lung base are unchanged. The lungs are emphysematous. Increased airspace opacity in the posterior right lower lobe since the prior exam is seen medially and worrisome for pneumonia. Scattered, patchy areas of ground-glass attenuation throughout the right lung are not grossly changed. The left lung is clear. Upper Abdomen: A soft tissue nodule measuring 1.8 x 1.6 cm on image 125 posterior to the medial limb of the right adrenal gland is new since the prior study. Also seen is new fullness of the visualized left intrarenal collecting system which is new since the prior exam. Musculoskeletal: No lytic or sclerotic lesion is identified. IMPRESSION: Some increase in airspace opacity in the right lower lobe worrisome for pneumonia. Lung carcinoma with metastatic lymphadenopathy appears improved as described above. Visualized left intrarenal collecting system is dilated worrisome for hydronephrosis, new since the prior examination. Cause for this finding is not identified. Renal ultrasound is recommended for further evaluation. New soft tissue nodule along the medial aspect of the posterior right hemidiaphragm is worrisome for metastatic disease. Aortic Atherosclerosis (ICD10-I70.0) and Emphysema (ICD10-J43.9). Electronically Signed   By: Inge Rise M.D.   On: 03/01/2018 12:37   Ct Angio Chest Pe W Or Wo Contrast  Result Date: 03/05/2018 CLINICAL DATA:  67 year old with worsening shortness of breath and chest discomfort. Right lung cancer. EXAM: CT ANGIOGRAPHY CHEST WITH CONTRAST TECHNIQUE:  Multidetector CT imaging of the chest was performed using the standard protocol during bolus administration  of intravenous contrast. Multiplanar CT image reconstructions and MIPs were obtained to evaluate the vascular anatomy. CONTRAST:  32mL ISOVUE-370 IOPAMIDOL (ISOVUE-370) INJECTION 76% COMPARISON:  03/01/2018 FINDINGS: Cardiovascular: There is minimal blood flow in the main right pulmonary artery related to a large thrombus burden. There is a small amount of thrombus extending into the main pulmonary artery and near the origin of the left main pulmonary artery. Scattered emboli in the segmental branches of the left pulmonary arteries. There is no blood flow within the lobar and segmental right pulmonary arteries. Evidence of previous CABG procedure. No significant pericardial fluid. Mediastinum/Nodes: Again noted is large amount of soft tissue in the right hilum and right side of the mediastinum. The soft tissue is encompassing the right bronchus intermedius. This soft tissue mass may have enlarged in size measuring up to 6.2 cm on sequence 5 image 47 previously measuring roughly 3.9 cm. However, it is difficult to measure this tissue due to the adjacent volume loss in the right lung. Large amount of soft tissue in the right upper mediastinum measuring up to 3.7 cm in thickness on sequence 5, 28 and previously measured roughly 3.0 cm on the recent comparison examination. Probable small supraclavicular lymph nodes. Lungs/Pleura: Trachea is patent. Right upper lobe is patent. There is marked narrowing throughout the right bronchus intermedius. Markedly decreased aeration in the right lower lobe with extensive consolidation. The patient has developed a small right pleural effusion. There appears to be no aeration to the right middle lobe. Increased patchy parenchymal densities throughout the right upper lung. Densities along the medial left upper lobe probably related to atelectasis. Upper Abdomen: Again noted is a  soft tissue nodule along the posterior right hemidiaphragm measuring roughly 2.0 cm. Small peritoneal nodule in the left upper quadrant on image 76. There is persistent moderate left hydronephrosis. There is at least mild right hydronephrosis. Musculoskeletal: Again noted are enlarged lymph nodes in the left sub pectoralis region. Again noted is a left chest Port-A-Cath. The catheter tip is near the superior cavoatrial junction. Review of the MIP images confirms the above findings. IMPRESSION: Massive pulmonary embolism involving the main right pulmonary artery with complete occlusion of the distal right pulmonary artery and right pulmonary artery branches. Right pulmonary artery is adjacent to the known mediastinal tumor and tumor thrombus cannot be excluded. Positive for small pulmonary emboli within left pulmonary artery branches. Extensive volume loss throughout the right lung related to compression on the airways to the right lower lobe and right middle lobe. Aeration to the right lower lobe has markedly decreased compared 03/01/2018 and patient has developed a small right pleural effusion. In addition, there is now patchy disease throughout the right upper lung. Soft tissue masses in the mediastinum and right hilum may have enlarged since 03/01/18 but difficult to accurately measure due to the extensive volume loss in the right hemithorax. Again noted is metastatic disease in the upper abdomen and left sub pectoralis region. Again noted is bilateral hydronephrosis, left side greater than right. Critical Value/emergent results were called by telephone at the time of interpretation on 03/05/2018 at 11:42 am to Dr. Demetrios Loll , who verbally acknowledged these results. Electronically Signed   By: Markus Daft M.D.   On: 03/05/2018 11:56   US Renal  Result Date: 03/02/2018 CLINICAL DATA:  67 year old female with LEFT hydronephrosis. History of RIGHT lung cancer. EXAM: RENAL / URINARY TRACT ULTRASOUND COMPLETE  COMPARISON:  03/01/2018 CT and prior studies FINDINGS: Right Kidney: Length: 10.7 cm. Echogenicity  within normal limits. No mass or hydronephrosis visualized. Left Kidney: Length: 12 cm. Mild LEFT hydronephrosis appears decreased from recent CT. Echogenicity within normal limits. No solid mass visualized. Bladder: Appears normal for degree of bladder distention. IMPRESSION: 1. Mild LEFT hydronephrosis, which appears decreased from 03/01/2018 CT. 2. No other significant abnormalities. Electronically Signed   By: Margarette Canada M.D.   On: 03/02/2018 10:46   US Venous Img Lower Bilateral  Result Date: 03/05/2018 CLINICAL DATA:  67 year old with lung cancer and pulmonary embolism. EXAM: BILATERAL LOWER EXTREMITY VENOUS DOPPLER ULTRASOUND TECHNIQUE: Gray-scale sonography with graded compression, as well as color Doppler and duplex ultrasound were performed to evaluate the lower extremity deep venous systems from the level of the common femoral vein and including the common femoral, femoral, profunda femoral, popliteal and calf veins including the posterior tibial, peroneal and gastrocnemius veins when visible. The superficial great saphenous vein was also interrogated. Spectral Doppler was utilized to evaluate flow at rest and with distal augmentation maneuvers in the common femoral, femoral and popliteal veins. COMPARISON:  None. FINDINGS: RIGHT LOWER EXTREMITY Common Femoral Vein: No evidence of thrombus. Normal compressibility, respiratory phasicity and response to augmentation. Saphenofemoral Junction: No evidence of thrombus. Normal compressibility and flow on color Doppler imaging. Profunda Femoral Vein: No evidence of thrombus. Normal compressibility and flow on color Doppler imaging. Femoral Vein: No evidence of thrombus. Normal compressibility, respiratory phasicity and response to augmentation. Popliteal Vein: No evidence of thrombus. Normal compressibility, respiratory phasicity and response to augmentation.  Calf Veins: Visualized right deep calf veins are patent without thrombus. Limited evaluation of the peroneal veins. LEFT LOWER EXTREMITY Common Femoral Vein: No evidence of thrombus. Normal compressibility, respiratory phasicity and response to augmentation. Saphenofemoral Junction: No evidence of thrombus. Normal compressibility and flow on color Doppler imaging. Profunda Femoral Vein: No evidence of thrombus. Normal compressibility and flow on color Doppler imaging. Femoral Vein: No evidence of thrombus. Normal compressibility, respiratory phasicity and response to augmentation. Popliteal Vein: No evidence of thrombus. Normal compressibility, respiratory phasicity and response to augmentation. Calf Veins: Visualized left deep calf veins are patent without thrombus. Limited evaluation of the peroneal veins. IMPRESSION: No evidence of deep venous thrombosis in the lower extremities. Electronically Signed   By: Markus Daft M.D.   On: 03/05/2018 18:24    Assessment and plan- Patient is a 67 y.o. female with history of CAD, diabetes, hyperlipidemia, hypertension kidney stone recent diagnosis of high-grade non-small cell lung cancer on chemo plus radiation, presents with cough, fever, acute kidney failure  #Pneumonia, afebrile.  Switch to oral Levaquin #High-grade carcinoma of lung, originally planned to start single agent [facility] outpatient tomorrow.  However unfortunately she developed massive right PE and currently on anticoagulation.  I discussed with patient and her family members. Patient is critically ill and her cancer just declared another aspect of the aggressive feature.   If she is able to turn the corner and be discharged home, will reschedule her immunotherapy treatments upon discharge.  Immunotherapy potentially has about response rate of 45% in patient who has TPS more than 50%.  Patient's score is 90% However if patient continues to decline from this point, hospice and comfort care is  reasonable.  Patient and her family members voiced understanding.  #AKI, kidney function got worse again, most likely secondary to contrast study.  I recommend consulting nephrology if kidney function continue to worse. #Dyspnea, secondary to PE.  Patient feels shortness of breath and requests some medication to help her breathing better.  I recommend palliative treatment of low-dose morphine oral 5 mg daily 4 hours as needed for dyspnea.  I discontinued IV morphine order as patient previously reports feeling completely knocked out after IV morphine.  Discontinue tramadol she is willing to try the low-dose oral morphine.  Risk of respiratory depression discussed with patient and her family member.   Ordered morphine 5 mg x 1 now and also 5 mg every 4 hours as needed for dyspnea.  # Massive PE, due to aggressive lung cancer. Agree with heparin gtt. So far tolerates well. No acute hemoglobin drop.  Stool occult negative.  Recommend continue heparin gtt inpatient. Upon discharge, switch to Eliquis.   Thank you for allowing me to participate in the care of this patient.  Total face to face encounter time for this patient visit was 25 min. >50% of the time was  spent in counseling and coordination of care.  Earlie Server, MD, PhD Hematology Oncology [facility] Pager- [phone] 03/07/2018

## 2018-03-07 NOTE — Telephone Encounter (Signed)
Asking for Palliative care order. Please advise if in agreement.

## 2018-03-07 NOTE — Progress Notes (Addendum)
[facility] [place] NAME: Dana Ramirez    MR#:  [id number]  DATE OF BIRTH:  11-05-1950  SUBJECTIVE:   States she is feeling little bit better this morning.  Shortness of breath has improved.  No additional bloody bowel movements.  No chest pain.  REVIEW OF SYSTEMS:    Review of Systems  Constitutional: Negative for chills and fever.  HENT: Negative for congestion and tinnitus.   Eyes: Negative for blurred vision and double vision.  Respiratory: Positive for cough and shortness of breath (improved. ). Negative for wheezing.   Cardiovascular: Negative for chest pain, orthopnea and PND.  Gastrointestinal: Negative for abdominal pain, diarrhea, nausea and vomiting.  Genitourinary: Negative for dysuria and hematuria.  Neurological: Positive for weakness (Generalized. ). Negative for dizziness, sensory change and focal weakness.  All other systems reviewed and are negative.   Nutrition: Heart Healthy/Carb control Tolerating Diet: Yes Tolerating PT: Await Eval.   DRUG ALLERGIES:   Allergies  Allergen Reactions  . Codeine Nausea And Vomiting  . Lisinopril Cough  . Penicillins Itching    Has patient had a PCN reaction causing immediate rash, facial/tongue/throat swelling, SOB or lightheadedness with hypotension: no Has patient had a PCN reaction causing severe rash involving mucus membranes or skin necrosis: no Has patient had a PCN reaction that required hospitalization: no Has patient had a PCN reaction occurring within the last 10 years: no If all of the above answers are "NO", then may proceed with Cephalosporin use.     VITALS:  Blood pressure 119/63, pulse (!) 102, temperature 97.8 F (36.6 C), temperature source Oral, resp. rate 20, height 5\' 3"  (1.6 m), weight 65.8 kg, SpO2 100 %.  PHYSICAL EXAMINATION:   Physical Exam  GENERAL:  67 y.o.-year-old patient lying in bed in NAD.    EYES: Pupils equal, round, reactive to light  and accommodation. No scleral icterus. Extraocular muscles intact.  HEENT: Head atraumatic, normocephalic. Oropharynx and nasopharynx clear. MMM. NECK:  Supple, no jugular venous distention. No thyroid enlargement, no tenderness.  LUNGS: +crackles in the right lung extending from the base to the mid-lung. No use of accessory muscles of respiration. [place] in place. CARDIOVASCULAR: Tachycardic, regular rhythm, S1, S2 normal. No murmurs, rubs, or gallops.  ABDOMEN: Soft, nontender, nondistended. Bowel sounds present. No organomegaly or mass.  EXTREMITIES: No cyanosis, clubbing.  Bilateral leg edema. NEUROLOGIC: Cranial nerves II through XII are intact. +global weakness. Sensation intact to light touch. PSYCHIATRIC: The patient is alert and oriented x 3. Flat affect. SKIN: No obvious rash, lesion, or ulcer.    LABORATORY PANEL:   CBC Recent Labs  Lab 03/07/18 0514  WBC 5.3  HGB 9.2*  HCT 26.9*  PLT 159   ------------------------------------------------------------------------------------------------------------------  Chemistries  Recent Labs  Lab 03/01/18 0915  03/07/18 0514  NA 131*   < > 136  K 3.8   < > 3.3*  CL 96*   < > 105  CO2 22   < > 25  GLUCOSE 321*   < > 159*  BUN 24*   < > 24*  CREATININE 1.13*   < > 1.68*  CALCIUM 9.2   < > 8.2*  AST 19  --   --   ALT 17  --   --   ALKPHOS 87  --   --   BILITOT 0.5  --   --    < > = values in this interval not displayed.   ------------------------------------------------------------------------------------------------------------------  Cardiac Enzymes No results for input(s): TROPONINI in the last 168 hours. ------------------------------------------------------------------------------------------------------------------  RADIOLOGY:  US Venous Img Lower Bilateral  Result Date: 03/05/2018 CLINICAL DATA:  67 year old with lung cancer and pulmonary embolism. EXAM: BILATERAL LOWER EXTREMITY VENOUS DOPPLER ULTRASOUND TECHNIQUE:  Gray-scale sonography with graded compression, as well as color Doppler and duplex ultrasound were performed to evaluate the lower extremity deep venous systems from the level of the common femoral vein and including the common femoral, femoral, profunda femoral, popliteal and calf veins including the posterior tibial, peroneal and gastrocnemius veins when visible. The superficial great saphenous vein was also interrogated. Spectral Doppler was utilized to evaluate flow at rest and with distal augmentation maneuvers in the common femoral, femoral and popliteal veins. COMPARISON:  None. FINDINGS: RIGHT LOWER EXTREMITY Common Femoral Vein: No evidence of thrombus. Normal compressibility, respiratory phasicity and response to augmentation. Saphenofemoral Junction: No evidence of thrombus. Normal compressibility and flow on color Doppler imaging. Profunda Femoral Vein: No evidence of thrombus. Normal compressibility and flow on color Doppler imaging. Femoral Vein: No evidence of thrombus. Normal compressibility, respiratory phasicity and response to augmentation. Popliteal Vein: No evidence of thrombus. Normal compressibility, respiratory phasicity and response to augmentation. Calf Veins: Visualized right deep calf veins are patent without thrombus. Limited evaluation of the peroneal veins. LEFT LOWER EXTREMITY Common Femoral Vein: No evidence of thrombus. Normal compressibility, respiratory phasicity and response to augmentation. Saphenofemoral Junction: No evidence of thrombus. Normal compressibility and flow on color Doppler imaging. Profunda Femoral Vein: No evidence of thrombus. Normal compressibility and flow on color Doppler imaging. Femoral Vein: No evidence of thrombus. Normal compressibility, respiratory phasicity and response to augmentation. Popliteal Vein: No evidence of thrombus. Normal compressibility, respiratory phasicity and response to augmentation. Calf Veins: Visualized left deep calf veins are  patent without thrombus. Limited evaluation of the peroneal veins. IMPRESSION: No evidence of deep venous thrombosis in the lower extremities. Electronically Signed   By: Markus Daft M.D.   On: 03/05/2018 18:24     ASSESSMENT AND PLAN:   67 year old female with past medical history of non-small cell lung cancer, diabetes, hypertension, hyperlipidemia, history of nephrolithiasis, carotid artery disease who presented to the hospital due to shortness of breath, chest pain and cough.  Acute massive pulmonary embolism- likely due to hypercoagulability in setting of non-small cell lung cancer. Hemodynamically stable. Venous dopplers negative for DVT. - continue heparin drip - oncology consulted for oral anticoagulation recommendations - palliative care consulted  Acute hypoxic respiratory failure secondary to HCAP- improving. Remains on 2L O2 by [place].  - continue oral levaquin - wean O2 as tolerated - plan to ambulate with pulse ox prior to discharge  Left flank pain/hydronephrosis- stable. Normal urine output. - seen by urology this admission who did not think patient warranted any acute intervention due to improvement in Cr - continue supportive care - Patient to have repeat ultrasound done in the next 2 weeks.  Acute kidney injury- secondary to dehydration/nephrolithiasis/ intrinsic compression from underlying malignancy, also with recent contrast load for CTA chest. Cr increased from 1.25 > 1.54 > 1.68 after contrast. - d/c toradol - continue IVFs today - repeat BMP tomorrow  ?Hematochezia- concern for bloody stool 8/18, but she was hemoccult negative. Hgb stable. - continue to monitor - low threshold to consult GI for acute bleed - will likely need GI f/u as an outpatient  High grade non-small cell lung cancer with recurrence-seen by oncology.  Patient is /p chemoradiation but has now worsening recurrence.   -  plan to start [facility] as outpatient next week - oncology consulted -  palliative care consulted.  Essential hypertension- normotensive here. - continue metoprolol succinate 25mg  daily  Hyperlipidemia- stable. - continue Crestor.  Anxiety- stable - continue Ativan as needed.  Very poor prognosis. All the records are reviewed and case discussed with Care Management/Social Worker. Management plans discussed with the patient, her husband, son and daughter and they are in agreement.  CODE STATUS: DNR  TOTAL TIME TAKING CARE OF THIS PATIENT: 40 minutes.   POSSIBLE D/C IN 1-2 DAYS, DEPENDING ON CLINICAL CONDITION.   Berna Spare Marlyn Tondreau M.D on 03/07/2018 at 12:48 PM  Between 7am to 6pm - Pager - [phone]  After 6pm go to [email or web address] - Proofreader  [facility]  Office  [phone]  CC: Primary care physician; Crecencio Mc, MD

## 2018-03-08 ENCOUNTER — Inpatient Hospital Stay: Payer: Medicare Other | Admitting: Oncology

## 2018-03-08 ENCOUNTER — Inpatient Hospital Stay: Payer: Medicare Other

## 2018-03-08 ENCOUNTER — Ambulatory Visit: Payer: Medicare Other

## 2018-03-08 DIAGNOSIS — R0602 Shortness of breath: Secondary | ICD-10-CM

## 2018-03-08 LAB — CBC
HCT: 22.5 % — ABNORMAL LOW (ref 35.0–47.0)
Hemoglobin: 7.6 g/dL — ABNORMAL LOW (ref 12.0–16.0)
MCH: 32.2 pg (ref 26.0–34.0)
MCHC: 33.8 g/dL (ref 32.0–36.0)
MCV: 95.2 fL (ref 80.0–100.0)
PLATELETS: 160 10*3/uL (ref 150–440)
RBC: 2.36 MIL/uL — ABNORMAL LOW (ref 3.80–5.20)
RDW: 19.7 % — AB (ref 11.5–14.5)
WBC: 5.4 10*3/uL (ref 3.6–11.0)

## 2018-03-08 LAB — GLUCOSE, CAPILLARY
GLUCOSE-CAPILLARY: 232 mg/dL — AB (ref 70–99)
GLUCOSE-CAPILLARY: 218 mg/dL — AB (ref 70–99)
Glucose-Capillary: 149 mg/dL — ABNORMAL HIGH (ref 70–99)
Glucose-Capillary: 311 mg/dL — ABNORMAL HIGH (ref 70–99)

## 2018-03-08 LAB — BASIC METABOLIC PANEL
Anion gap: 4 — ABNORMAL LOW (ref 5–15)
BUN: 20 mg/dL (ref 8–23)
CALCIUM: 8.1 mg/dL — AB (ref 8.9–10.3)
CO2: 26 mmol/L (ref 22–32)
Chloride: 107 mmol/L (ref 98–111)
Creatinine, Ser: 1.49 mg/dL — ABNORMAL HIGH (ref 0.44–1.00)
GFR calc Af Amer: 41 mL/min — ABNORMAL LOW (ref >60)
GFR, EST NON AFRICAN AMERICAN: 36 mL/min — AB (ref >60)
GLUCOSE: 152 mg/dL — AB (ref 70–99)
POTASSIUM: 4.4 mmol/L (ref 3.5–5.1)
Sodium: 137 mmol/L (ref 135–145)

## 2018-03-08 LAB — VITAMIN B12: VITAMIN B 12: 3258 pg/mL — AB (ref 180–914)

## 2018-03-08 LAB — HEPARIN LEVEL (UNFRACTIONATED): Heparin Unfractionated: 0.41 IU/mL (ref 0.30–0.70)

## 2018-03-08 LAB — FOLATE: Folate: 10.8 ng/mL (ref >5.9)

## 2018-03-08 LAB — IRON AND TIBC
Iron: 19 ug/dL — ABNORMAL LOW (ref 28–170)
Saturation Ratios: 12 % (ref 10.4–31.8)
TIBC: 160 ug/dL — AB (ref 250–450)
UIBC: 141 ug/dL

## 2018-03-08 LAB — ABO/RH: ABO/RH(D): B POS

## 2018-03-08 LAB — FERRITIN: FERRITIN: 1182 ng/mL — AB (ref 11–307)

## 2018-03-08 LAB — PREPARE RBC (CROSSMATCH)

## 2018-03-08 MED ORDER — ACETAMINOPHEN 500 MG PO TABS
500.0000 mg | ORAL_TABLET | Freq: Once | ORAL | Status: AC
  Administered 2018-03-08: 500 mg via ORAL

## 2018-03-08 MED ORDER — INSULIN ASPART 100 UNIT/ML ~~LOC~~ SOLN
0.0000 [IU] | Freq: Three times a day (TID) | SUBCUTANEOUS | Status: DC
  Administered 2018-03-08 – 2018-03-09 (×3): 5 [IU] via SUBCUTANEOUS
  Administered 2018-03-09: 18:00:00 11 [IU] via SUBCUTANEOUS
  Administered 2018-03-10: 3 [IU] via SUBCUTANEOUS
  Administered 2018-03-10: 13:00:00 5 [IU] via SUBCUTANEOUS
  Filled 2018-03-08 (×6): qty 1

## 2018-03-08 MED ORDER — ACETAMINOPHEN 500 MG PO TABS
ORAL_TABLET | ORAL | Status: AC
  Administered 2018-03-08: 500 mg via ORAL
  Filled 2018-03-08: qty 1

## 2018-03-08 MED ORDER — SODIUM CHLORIDE 0.9% IV SOLUTION
Freq: Once | INTRAVENOUS | Status: AC
  Administered 2018-03-08: 14:00:00 via INTRAVENOUS

## 2018-03-08 MED ORDER — INSULIN ASPART 100 UNIT/ML ~~LOC~~ SOLN
0.0000 [IU] | Freq: Every day | SUBCUTANEOUS | Status: DC
  Administered 2018-03-08: 2 [IU] via SUBCUTANEOUS
  Filled 2018-03-08: qty 1

## 2018-03-08 NOTE — Consult Note (Signed)
ANTICOAGULATION CONSULT NOTE -  Pharmacy Consult for Heparin Drip  Indication: pulmonary embolus  Patient Measurements: Height: 5\' 3"  (160 cm) Weight: 145 lb (65.8 kg) IBW/kg (Calculated) : 52.4  Labs: Recent Labs    03/05/18 1419  03/06/18 0407  03/06/18 1612 03/07/18 0514 03/08/18 0507  HGB  --    < > 9.5*  --   --  9.2* 7.6*  HCT  --   --  27.4*  --   --  26.9* 22.5*  PLT  --   --  146*  --   --  159 160  APTT 31  --   --   --   --   --   --   LABPROT 14.3  --   --   --   --   --   --   INR 1.12  --   --   --   --   --   --   HEPARINUNFRC 0.10*   < > 0.71*   < > 0.43 0.48 0.41  CREATININE  --   --  1.54*  --   --  1.68* 1.49*   < > = values in this interval not displayed.    Assessment: Pharmacy consulted heparin dosing in 67 yo female with PE.  Patient has been receiving enoxaparin 40mg  every 24 hours while admitted. Last enoxaparin dose @1816  on 8/16.   Heparin infusing at 1000 units/hr 8/18 16:12 Heparin level 0.43  Goal of Therapy:  Heparin level 0.3-0.7 units/ml Monitor platelets by anticoagulation protocol: Yes   Plan:  08/19 @ 0500 HL 0.48 therapeutic. Will continue current rate and will recheck HL w/ am labs. hgb trending down slightly but stable.  8/20 AM heparin level 0.41. Continue current regimen. Recheck heparin level and CBC with tomorrow AM labs. Hgb 9.2>> 7.6; no overt bleeding per RN.  Paticia Stack, PharmD Pharmacy Resident  03/08/2018 7:40 AM

## 2018-03-08 NOTE — Progress Notes (Signed)
Hematology/Oncology Progress Note [facility] [phone] [phone]  Patient Care Team: Crecencio Mc, MD as PCP - General (Internal Medicine) Minna Merritts, MD (Cardiology) Telford Nab, RN as Registered Nurse Bary Castilla, Forest Gleason, MD (General Surgery) Earlie Server, MD as Medical Oncologist (Medical Oncology)   Name of the patient: Dana Ramirez  [id number]  [birth year]  Date of visit: 03/08/18   INTERVAL HISTORY-  Patient was seen and examined at bedside.  Reports feeling less shortness of breath after taking low-dose morphine.  Denies any drowsiness after taking morphine.  Feels slightly better today, still reports very weak and fatigued. Tolerates heparin drip so far.  No reports of active bleeding events. Review of systems- Review of Systems  Constitutional: Positive for malaise/fatigue. Negative for chills, fever and weight loss.  HENT: Negative for nosebleeds and sore throat.   Eyes: Negative for double vision, photophobia and redness.  Respiratory: Positive for shortness of breath. Negative for cough and wheezing.   Cardiovascular: Negative for chest pain, palpitations and orthopnea.  Gastrointestinal: Negative for abdominal pain, blood in stool, heartburn, nausea and vomiting.  Genitourinary: Negative for dysuria.  Musculoskeletal: Negative for back pain, myalgias and neck pain.  Skin: Negative for itching and rash.  Neurological: Positive for weakness. Negative for dizziness, tingling and tremors.  Endo/Heme/Allergies: Negative for environmental allergies. Does not bruise/bleed easily.  Psychiatric/Behavioral: Negative for depression and suicidal ideas.    Allergies  Allergen Reactions  . Codeine Nausea And Vomiting  . Lisinopril Cough  . Penicillins Itching    Has patient had a PCN reaction causing immediate rash, facial/tongue/throat swelling, SOB or lightheadedness with hypotension: no Has patient had a PCN reaction  causing severe rash involving mucus membranes or skin necrosis: no Has patient had a PCN reaction that required hospitalization: no Has patient had a PCN reaction occurring within the last 10 years: no If all of the above answers are "NO", then may proceed with Cephalosporin use.     Patient Active Problem List   Diagnosis Date Noted  . Nausea without vomiting   . Pulmonary emboli ([place])   . Shortness of breath   . AKI (acute kidney injury) ([place])   . Hydronephrosis, left   . Pneumonia 03/01/2018  . Supraclavicular lymphadenopathy 01/26/2018  . Goals of care, counseling/discussion 01/07/2018  . Lung cancer ([place]) 01/06/2018  . Malignant neoplasm of overlapping sites of right lung ([place]) 12/28/2017  . Wheezing 12/21/2017  . Mediastinal lymphadenopathy 12/21/2017  . Neck mass 12/08/2017  . Cervical radiculopathy at C8 11/06/2017  . GERD (gastroesophageal reflux disease) 04/10/2017  . 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 B12 deficiency 04/02/2016  . Cough in adult patient 12/28/2015  . Carpal tunnel syndrome 06/03/2015  . Atherosclerosis of coronary artery 06/03/2015  . Hypertension 06/03/2015  . Subclinical hypothyroidism 06/03/2015  . Diabetes mellitus without complication ([place]) 16/60/6301  . Carotid stenosis 12/21/2012  . Hyperlipidemia 12/19/2009  . Atherosclerosis of native coronary artery of native heart with stable angina pectoris ([place]) 12/19/2009     Past Medical History:  Diagnosis Date  . CAD (coronary artery disease)   . Carotid arterial disease ([place])   . Diabetes mellitus without complication ([place])   . History of vertigo   . Hyperlipidemia   . Hypertension   . Kidney stone      Past Surgical History:  Procedure Laterality Date  . BREAST CYST ASPIRATION Left 1980s  . CESAREAN SECTION     x 2  . CHOLECYSTECTOMY    .  CORONARY ARTERY BYPASS GRAFT  03/2005   LIMA to the LAD, vein graft to diagonal with a negative stress test in 06/2006  . PORTACATH PLACEMENT Left  12/31/2017   Procedure: INSERTION PORT-A-CATH;  Surgeon: Robert Bellow, MD;  Location: [facility] ORS;  Service: General;  Laterality: Left;  . SENTINEL NODE BIOPSY Right 12/31/2017   Procedure: SUPRACLAVICULAR NODE BIOPSY;  Surgeon: Robert Bellow, MD;  Location: [facility] ORS;  Service: General;  Laterality: Right;  . TONSILLECTOMY      Social History   Socioeconomic History  . Marital status: Married    Spouse name: Elenore Rota   . Number of children: 2  . Years of education: Not on file  . Highest education level: Not on file  Occupational History  . Occupation: Retired     Fish farm manager: OTHER    Comment: [employer]  Social Needs  . Financial resource strain: Not on file  . Food insecurity:    Worry: Not on file    Inability: Not on file  . Transportation needs:    Medical: Not on file    Non-medical: Not on file  Tobacco Use  . Smoking status: Never Smoker  . Smokeless tobacco: Never Used  Substance and Sexual Activity  . Alcohol use: No  . Drug use: No  . Sexual activity: Not on file  Lifestyle  . Physical activity:    Days per week: Not on file    Minutes per session: Not on file  . Stress: Not on file  Relationships  . Social connections:    Talks on phone: Not on file    Gets together: Not on file    Attends religious service: Not on file    Active member of club or organization: Not on file    Attends meetings of clubs or organizations: Not on file    Relationship status: Not on file  . Intimate partner violence:    Fear of current or ex partner: Not on file    Emotionally abused: Not on file    Physically abused: Not on file    Forced sexual activity: Not on file  Other Topics Concern  . Not on file  Social History Narrative   Married   Gets regular exercise, once or twice every week     Family History  Problem Relation Age of Onset  . Heart disease Mother   . Heart disease Father 29  . Stroke Sister 73  . Heart disease Sister   . Diabetes Brother   .  Coronary artery disease Other        Strong family history of CAD and CABG  . Heart disease Maternal Grandmother   . Breast cancer Maternal Grandmother   . Heart disease Maternal Grandfather   . Heart disease Paternal Grandmother   . Breast cancer Paternal Grandmother 31  . Heart disease Paternal Grandfather      Current Facility-Administered Medications:  .  acetaminophen (TYLENOL) tablet 650 mg, 650 mg, Oral, Q6H PRN, Cyndee Brightly M, RPH .  albuterol (PROVENTIL) (2.5 MG/3ML) 0.083% nebulizer solution 3 mL, 3 mL, Inhalation, Q6H PRN, Vaughan Basta, MD, 3 mL at 03/04/18 1922 .  aspirin EC tablet 81 mg, 81 mg, Oral, BID, Vaughan Basta, MD, 81 mg at 03/08/18 0758 .  benzonatate (TESSALON) capsule 200 mg, 200 mg, Oral, Q6H PRN, Pyreddy, Pavan, MD, 200 mg at 03/08/18 0002 .  chlorpheniramine-HYDROcodone (TUSSIONEX) 10-8 MG/5ML suspension 5 mL, 5 mL, Oral, Q12H PRN, Vaughan Basta, MD, 5  mL at 03/07/18 0446 .  cholecalciferol (VITAMIN D) tablet 1,000 Units, 1,000 Units, Oral, Daily, Vaughan Basta, MD, 1,000 Units at 03/08/18 0800 .  docusate sodium (COLACE) capsule 100 mg, 100 mg, Oral, BID PRN, Vaughan Basta, MD .  docusate sodium (COLACE) capsule 100 mg, 100 mg, Oral, BID, Vaughan Basta, MD, 100 mg at 03/08/18 0801 .  guaiFENesin-dextromethorphan (ROBITUSSIN DM) 100-10 MG/5ML syrup 5 mL, 5 mL, Oral, Q4H PRN, Demetrios Loll, MD, 5 mL at 03/08/18 0758 .  heparin ADULT infusion 100 units/mL (25000 units/269mL sodium chloride 0.45%), 1,000 Units/hr, Intravenous, Continuous, Demetrios Loll, MD, Last Rate: 10 mL/hr at 03/07/18 1813, 1,000 Units/hr at 03/07/18 1813 .  insulin aspart (novoLOG) injection 0-15 Units, 0-15 Units, Subcutaneous, TID WC, Mayo, Pete Pelt, MD .  insulin aspart (novoLOG) injection 0-5 Units, 0-5 Units, Subcutaneous, QHS, Mayo, Pete Pelt, MD .  insulin glargine (LANTUS) injection 10 Units, 10 Units, Subcutaneous, Daily, Demetrios Loll,  MD, 10 Units at 03/08/18 [phone] .  ipratropium-albuterol (DUONEB) 0.5-2.5 (3) MG/3ML nebulizer solution 3 mL, 3 mL, Nebulization, Once, Sridharan, Prasanna, MD .  lactulose ([place]) 10 GM/15ML solution 30 g, 30 g, Oral, BID PRN, Henreitta Leber, MD, 30 g at 03/07/18 1816 .  LORazepam (ATIVAN) injection 0.5 mg, 0.5 mg, Intravenous, Q8H PRN, Amelia Jo, MD, 0.5 mg at 03/05/18 0836 .  LORazepam (ATIVAN) tablet 0.5 mg, 0.5 mg, Oral, Q8H PRN, Vaughan Basta, MD, 0.5 mg at 03/07/18 1212 .  MEDLINE mouth rinse, 15 mL, Mouth Rinse, BID, Sainani, Belia Heman, MD, 15 mL at 03/08/18 0801 .  metoprolol succinate (TOPROL-XL) 24 hr tablet 25 mg, 25 mg, Oral, Daily, Vaughan Basta, MD, 25 mg at 03/08/18 0800 .  morphine CONCENTRATE 10 MG/0.5ML oral solution 5 mg, 5 mg, Oral, Q4H PRN, Amelia Jo, MD, 5 mg at 03/08/18 0759 .  pantoprazole (PROTONIX) EC tablet 40 mg, 40 mg, Oral, Daily, Vaughan Basta, MD, 40 mg at 03/08/18 0758 .  predniSONE (DELTASONE) tablet 5 mg, 5 mg, Oral, Q breakfast, Vaughan Basta, MD, 5 mg at 03/08/18 0800 .  promethazine (PHENERGAN) injection 12.5 mg, 12.5 mg, Intravenous, Q6H PRN, Earlie Server, MD .  rosuvastatin (CRESTOR) tablet 20 mg, 20 mg, Oral, QPM, Vaughan Basta, MD, 20 mg at 03/07/18 1817 .  sodium chloride flush (NS) 0.9 % injection 10-40 mL, 10-40 mL, Intracatheter, PRN, Verdell Carmine, Belia Heman, MD .  vitamin B-12 (CYANOCOBALAMIN) tablet 1,000 mcg, 1,000 mcg, Oral, Daily, Vaughan Basta, MD, 1,000 mcg at 03/08/18 0800   Physical exam:  Vitals:   03/07/18 0931 03/07/18 1949 03/08/18 0410 03/08/18 1539  BP: 119/63 133/72 122/66 116/71  Pulse: (!) 102 (!) 106 (!) 103 100  Resp: 20 18 18 18   Temp: 97.8 F (36.6 C) 98.9 F (37.2 C) 98.5 F (36.9 C) 97.9 F (36.6 C)  TempSrc: Oral Oral Oral Oral  SpO2: 100% 100% 100% 100%  Weight:      Height:       Physical Exam  Constitutional: She is oriented to person, place, and time and  well-developed, well-nourished, and in no distress. No distress.  HENT:  Head: Normocephalic and atraumatic.  Mouth/Throat: No oropharyngeal exudate.  Eyes: Pupils are equal, round, and reactive to light. EOM are normal. Left eye exhibits no discharge. No scleral icterus.  Neck: Normal range of motion. Neck supple.  Cardiovascular: Normal rate and regular rhythm.  No murmur heard. Pulmonary/Chest: Effort normal. She has no wheezes. She has no rales.  diminished breath sound on right lung.  Abdominal: Soft. Bowel sounds are normal. She exhibits no distension. There is no tenderness.  Musculoskeletal: Normal range of motion.  Lower extremity edema improved.   Neurological: She is alert and oriented to person, place, and time. No cranial nerve deficit. She exhibits normal muscle tone. Coordination normal.  Skin: Skin is warm and dry. She is not diaphoretic. No erythema.  Psychiatric: Affect normal.       CMP Latest Ref Rng & Units 03/08/2018  Glucose 70 - 99 mg/dL 152(H)  BUN 8 - 23 mg/dL 20  Creatinine 0.44 - 1.00 mg/dL 1.49(H)  Sodium 135 - 145 mmol/L 137  Potassium 3.5 - 5.1 mmol/L 4.4  Chloride 98 - 111 mmol/L 107  CO2 22 - 32 mmol/L 26  Calcium 8.9 - 10.3 mg/dL 8.1(L)  Total Protein 6.5 - 8.1 g/dL -  Total Bilirubin 0.3 - 1.2 mg/dL -  Alkaline Phos 38 - 126 U/L -  AST 15 - 41 U/L -  ALT 0 - 44 U/L -   CBC Latest Ref Rng & Units 03/08/2018  WBC 3.6 - 11.0 K/uL 5.4  Hemoglobin 12.0 - 16.0 g/dL 7.6(L)  Hematocrit 35.0 - 47.0 % 22.5(L)  Platelets 150 - 440 K/uL 160   RADIOGRAPHIC STUDIES: I have personally reviewed the radiological images as listed and agreed with the findings in the report. Ct Abdomen Pelvis Wo Contrast  Result Date: 03/01/2018 CLINICAL DATA:  History of non small cell lung carcinoma with fevers and nausea EXAM: CT ABDOMEN AND PELVIS WITHOUT CONTRAST TECHNIQUE: Multidetector CT imaging of the abdomen and pelvis was performed following the standard protocol  without IV contrast. COMPARISON:  12/24/2017 PET-CT FINDINGS: Lower chest: Chronic changes are noted in the right lower lobe stable from the prior PET-CT is well as a CT examination from 12/29/2014. Some slight increased interstitial changes are noted which may be related to lymphangitic spread of carcinoma. The left lung base is within normal limits. Along the inferior aspect of the right hemidiaphragm posteriorly there is a 1.8 cm soft tissue nodule best seen on image number 21 of series 2. When compare with the prior PET-CT this was not present and likely represents a metastatic focus. Hepatobiliary: No focal liver abnormality is seen. Status post cholecystectomy. No biliary dilatation. Pancreas: Unremarkable. No pancreatic ductal dilatation or surrounding inflammatory changes. Spleen: Normal in size without focal abnormality. Adrenals/Urinary Tract: Adrenal glands are within normal limits. Bladder is decompressed. Kidneys are well visualized bilaterally. Some mild hydronephrotic changes are noted on the left which extend inferiorly into the mid ureter. No definitive stone is seen although some soft tissue thickening of the ureter is noted. Possibility of underlying mass lesion cannot be totally excluded. The bladder is partially distended. Stomach/Bowel: Scattered diverticular change of the colon is noted without evidence of diverticulitis. The appendix is within normal limits. No small bowel abnormality is seen. Vascular/Lymphatic: Aortic atherosclerosis. No enlarged abdominal or pelvic lymph nodes. Reproductive: Uterus and bilateral adnexa are unremarkable. Other: No abdominal wall hernia or abnormality. No abdominopelvic ascites. Musculoskeletal: Degenerative changes of the lumbar spine are seen. IMPRESSION: Changes in the right lung base predominately chronic in nature although some changes consistent with lymphangitic spread of carcinoma are present. These are new from the prior PET-CT. Nodule along the  right hemidiaphragm posteriorly likely representing a metastatic deposit. Obstructive changes of the left renal collecting system and proximal ureter. Some thickening of the ureter is seen which may represent a focal lesion. Clinical correlation is recommended. Although not mentioned in  the body of the report there are 2 peritoneal soft tissue lesions identified. The largest of these lies on image number 49 of series 2 measuring approximately 18 mm in dimension. This may also represent some metastatic disease. Repeat PET-CT when the patient's condition improves may be helpful. Electronically Signed   By: Inez Catalina M.D.   On: 03/01/2018 21:12   Ct Chest Wo Contrast  Result Date: 03/01/2018 CLINICAL DATA:  Cough and shortness of breath beginning last night. The patient is undergoing chemotherapy and radiation therapy for lung carcinoma. EXAM: CT CHEST WITHOUT CONTRAST TECHNIQUE: Multidetector CT imaging of the chest was performed following the standard protocol without IV contrast. COMPARISON:  PET CT scan 12/24/2017. FINDINGS: Cardiovascular: The patient is status post CABG. Heart size is normal. Calcific aortic atherosclerosis is identified. No pericardial effusion. Mediastinum/Nodes: Previously seen 1.5 cm short axis dimension right supraclavicular node measures 0.8 cm on image 5 of series 2 today. Right suprahilar mass seen on the prior examination measures 4.4 x 2.7 cm today on image 32 compared to 4.9 x 3.6 cm on the prior study (remeasurement). The lesion abuts the right subclavian artery as on the prior exam. A right infrahilar mass lesion encasing the right mainstem bronchus measures 3.9 x 3.9 cm on image 67 compared to 4.3 x 4.3 cm on the prior exam. There is less mass effect on the right mainstem bronchus. The thyroid gland and esophagus are unremarkable. Lungs/Pleura: No pleural effusion. Thickened pleura and pleural calcification in the right lung base are unchanged. The lungs are emphysematous.  Increased airspace opacity in the posterior right lower lobe since the prior exam is seen medially and worrisome for pneumonia. Scattered, patchy areas of ground-glass attenuation throughout the right lung are not grossly changed. The left lung is clear. Upper Abdomen: A soft tissue nodule measuring 1.8 x 1.6 cm on image 125 posterior to the medial limb of the right adrenal gland is new since the prior study. Also seen is new fullness of the visualized left intrarenal collecting system which is new since the prior exam. Musculoskeletal: No lytic or sclerotic lesion is identified. IMPRESSION: Some increase in airspace opacity in the right lower lobe worrisome for pneumonia. Lung carcinoma with metastatic lymphadenopathy appears improved as described above. Visualized left intrarenal collecting system is dilated worrisome for hydronephrosis, new since the prior examination. Cause for this finding is not identified. Renal ultrasound is recommended for further evaluation. New soft tissue nodule along the medial aspect of the posterior right hemidiaphragm is worrisome for metastatic disease. Aortic Atherosclerosis (ICD10-I70.0) and Emphysema (ICD10-J43.9). Electronically Signed   By: Inge Rise M.D.   On: 03/01/2018 12:37   Ct Angio Chest Pe W Or Wo Contrast  Result Date: 03/05/2018 CLINICAL DATA:  67 year old with worsening shortness of breath and chest discomfort. Right lung cancer. EXAM: CT ANGIOGRAPHY CHEST WITH CONTRAST TECHNIQUE: Multidetector CT imaging of the chest was performed using the standard protocol during bolus administration of intravenous contrast. Multiplanar CT image reconstructions and MIPs were obtained to evaluate the vascular anatomy. CONTRAST:  75mL ISOVUE-370 IOPAMIDOL (ISOVUE-370) INJECTION 76% COMPARISON:  03/01/2018 FINDINGS: Cardiovascular: There is minimal blood flow in the main right pulmonary artery related to a large thrombus burden. There is a small amount of thrombus  extending into the main pulmonary artery and near the origin of the left main pulmonary artery. Scattered emboli in the segmental branches of the left pulmonary arteries. There is no blood flow within the lobar and segmental right pulmonary arteries.  Evidence of previous CABG procedure. No significant pericardial fluid. Mediastinum/Nodes: Again noted is large amount of soft tissue in the right hilum and right side of the mediastinum. The soft tissue is encompassing the right bronchus intermedius. This soft tissue mass may have enlarged in size measuring up to 6.2 cm on sequence 5 image 47 previously measuring roughly 3.9 cm. However, it is difficult to measure this tissue due to the adjacent volume loss in the right lung. Large amount of soft tissue in the right upper mediastinum measuring up to 3.7 cm in thickness on sequence 5, 28 and previously measured roughly 3.0 cm on the recent comparison examination. Probable small supraclavicular lymph nodes. Lungs/Pleura: Trachea is patent. Right upper lobe is patent. There is marked narrowing throughout the right bronchus intermedius. Markedly decreased aeration in the right lower lobe with extensive consolidation. The patient has developed a small right pleural effusion. There appears to be no aeration to the right middle lobe. Increased patchy parenchymal densities throughout the right upper lung. Densities along the medial left upper lobe probably related to atelectasis. Upper Abdomen: Again noted is a soft tissue nodule along the posterior right hemidiaphragm measuring roughly 2.0 cm. Small peritoneal nodule in the left upper quadrant on image 76. There is persistent moderate left hydronephrosis. There is at least mild right hydronephrosis. Musculoskeletal: Again noted are enlarged lymph nodes in the left sub pectoralis region. Again noted is a left chest Port-A-Cath. The catheter tip is near the superior cavoatrial junction. Review of the MIP images confirms the  above findings. IMPRESSION: Massive pulmonary embolism involving the main right pulmonary artery with complete occlusion of the distal right pulmonary artery and right pulmonary artery branches. Right pulmonary artery is adjacent to the known mediastinal tumor and tumor thrombus cannot be excluded. Positive for small pulmonary emboli within left pulmonary artery branches. Extensive volume loss throughout the right lung related to compression on the airways to the right lower lobe and right middle lobe. Aeration to the right lower lobe has markedly decreased compared 03/01/2018 and patient has developed a small right pleural effusion. In addition, there is now patchy disease throughout the right upper lung. Soft tissue masses in the mediastinum and right hilum may have enlarged since 03/01/18 but difficult to accurately measure due to the extensive volume loss in the right hemithorax. Again noted is metastatic disease in the upper abdomen and left sub pectoralis region. Again noted is bilateral hydronephrosis, left side greater than right. Critical Value/emergent results were called by telephone at the time of interpretation on 03/05/2018 at 11:42 am to Dr. Demetrios Loll , who verbally acknowledged these results. Electronically Signed   By: Markus Daft M.D.   On: 03/05/2018 11:56   US Renal  Result Date: 03/02/2018 CLINICAL DATA:  67 year old female with LEFT hydronephrosis. History of RIGHT lung cancer. EXAM: RENAL / URINARY TRACT ULTRASOUND COMPLETE COMPARISON:  03/01/2018 CT and prior studies FINDINGS: Right Kidney: Length: 10.7 cm. Echogenicity within normal limits. No mass or hydronephrosis visualized. Left Kidney: Length: 12 cm. Mild LEFT hydronephrosis appears decreased from recent CT. Echogenicity within normal limits. No solid mass visualized. Bladder: Appears normal for degree of bladder distention. IMPRESSION: 1. Mild LEFT hydronephrosis, which appears decreased from 03/01/2018 CT. 2. No other significant  abnormalities. Electronically Signed   By: Margarette Canada M.D.   On: 03/02/2018 10:46   US Venous Img Lower Bilateral  Result Date: 03/05/2018 CLINICAL DATA:  67 year old with lung cancer and pulmonary embolism. EXAM: BILATERAL LOWER EXTREMITY VENOUS  DOPPLER ULTRASOUND TECHNIQUE: Gray-scale sonography with graded compression, as well as color Doppler and duplex ultrasound were performed to evaluate the lower extremity deep venous systems from the level of the common femoral vein and including the common femoral, femoral, profunda femoral, popliteal and calf veins including the posterior tibial, peroneal and gastrocnemius veins when visible. The superficial great saphenous vein was also interrogated. Spectral Doppler was utilized to evaluate flow at rest and with distal augmentation maneuvers in the common femoral, femoral and popliteal veins. COMPARISON:  None. FINDINGS: RIGHT LOWER EXTREMITY Common Femoral Vein: No evidence of thrombus. Normal compressibility, respiratory phasicity and response to augmentation. Saphenofemoral Junction: No evidence of thrombus. Normal compressibility and flow on color Doppler imaging. Profunda Femoral Vein: No evidence of thrombus. Normal compressibility and flow on color Doppler imaging. Femoral Vein: No evidence of thrombus. Normal compressibility, respiratory phasicity and response to augmentation. Popliteal Vein: No evidence of thrombus. Normal compressibility, respiratory phasicity and response to augmentation. Calf Veins: Visualized right deep calf veins are patent without thrombus. Limited evaluation of the peroneal veins. LEFT LOWER EXTREMITY Common Femoral Vein: No evidence of thrombus. Normal compressibility, respiratory phasicity and response to augmentation. Saphenofemoral Junction: No evidence of thrombus. Normal compressibility and flow on color Doppler imaging. Profunda Femoral Vein: No evidence of thrombus. Normal compressibility and flow on color Doppler imaging.  Femoral Vein: No evidence of thrombus. Normal compressibility, respiratory phasicity and response to augmentation. Popliteal Vein: No evidence of thrombus. Normal compressibility, respiratory phasicity and response to augmentation. Calf Veins: Visualized left deep calf veins are patent without thrombus. Limited evaluation of the peroneal veins. IMPRESSION: No evidence of deep venous thrombosis in the lower extremities. Electronically Signed   By: Markus Daft M.D.   On: 03/05/2018 18:24    Assessment and plan- Patient is a 67 y.o. female with history of CAD, diabetes, hyperlipidemia, hypertension kidney stone recent diagnosis of high-grade non-small cell lung cancer on chemo plus radiation, presents with cough, fever, acute kidney failure  #Pneumonia, afebrile. Continue oral Levaquine.  #High-grade carcinoma of lung, outpatient immunotherapy planned. # Goal of care discussed. Palliative intent.     #AKI, kidney function gets slightly better. Recommend cut back IVF  #Dyspnea, secondary to PE/lung cancer progression.  Continue low dose Morphine PRN for dyspnea.   # Symtomatic anemia. Hb further dropped to 7.6.  Recommend transfuse 1 unit of PRBC today.   # Lower extremity swelling, improved. Today.   # Massive PE, due to aggressive lung cancer. Continue heparin gtt during inpatient. Upon discharge, switch to Eliquis. [ADAM VTE trial supports use of Eliquis in patient with cancer].   Above plan was discussed with Dr.Mayo.   Thank you for allowing me to participate in the care of this patient.  Total face to face encounter time for this patient visit was 25 min. >50% of the time was  spent in counseling and coordination of care.   Earlie Server, MD, PhD Hematology Oncology [facility] at [facility] Pager- [phone] 03/08/2018

## 2018-03-08 NOTE — Progress Notes (Addendum)
Dana Ramirez at [place] NAME: Dana Ramirez    MR#:  [id number]  DATE OF BIRTH:  1950/09/15  SUBJECTIVE:   Breathing is much improved this morning.  Still having a lot of weakness.  Denies any bloody bowel movements or melena.  REVIEW OF SYSTEMS:    Review of Systems  Constitutional: Negative for chills and fever.  HENT: Negative for congestion and tinnitus.   Eyes: Negative for blurred vision and double vision.  Respiratory: Positive for cough. Negative for shortness of breath and wheezing.   Cardiovascular: Negative for chest pain, orthopnea and PND.  Gastrointestinal: Negative for abdominal pain, diarrhea, nausea and vomiting.  Genitourinary: Negative for dysuria and hematuria.  Neurological: Positive for weakness. Negative for dizziness, sensory change and focal weakness.  All other systems reviewed and are negative.   Nutrition: Heart Healthy/Carb control Tolerating Diet: Yes Tolerating PT: Await Eval.   DRUG ALLERGIES:   Allergies  Allergen Reactions  . Codeine Nausea And Vomiting  . Lisinopril Cough  . Penicillins Itching    Has patient had a PCN reaction causing immediate rash, facial/tongue/throat swelling, SOB or lightheadedness with hypotension: no Has patient had a PCN reaction causing severe rash involving mucus membranes or skin necrosis: no Has patient had a PCN reaction that required hospitalization: no Has patient had a PCN reaction occurring within the last 10 years: no If all of the above answers are "NO", then may proceed with Cephalosporin use.     VITALS:  Blood pressure 122/66, pulse (!) 103, temperature 98.5 F (36.9 C), temperature source Oral, resp. rate 18, height 5\' 3"  (1.6 m), weight 65.8 kg, SpO2 100 %.  PHYSICAL EXAMINATION:   Physical Exam  GENERAL:  67 y.o.-year-old patient sitting up in wheelchair in NAD.  Tired-appearing, but non-toxic. EYES: Pupils equal, round, reactive to light and  accommodation. No scleral icterus. Extraocular muscles intact.  HEENT: Head atraumatic, normocephalic. Oropharynx and nasopharynx clear. MMM. NECK:  Supple, no jugular venous distention. No thyroid enlargement, no tenderness.  LUNGS: +faint crackles in the right lung base. No use of accessory muscles of respiration. [place] in place. CARDIOVASCULAR: Tachycardic, regular rhythm, S1, S2 normal. No murmurs, rubs, or gallops.  ABDOMEN: Soft, nontender, nondistended. Bowel sounds present. No organomegaly or mass.  EXTREMITIES: No cyanosis, clubbing.  +Bilateral leg edema. NEUROLOGIC: Cranial nerves II through XII are intact. +global weakness. Sensation intact to light touch. PSYCHIATRIC: The patient is alert and oriented x 3. Flat affect. SKIN: No obvious rash, lesion, or ulcer.    LABORATORY PANEL:   CBC Recent Labs  Lab 03/08/18 0507  WBC 5.4  HGB 7.6*  HCT 22.5*  PLT 160   ------------------------------------------------------------------------------------------------------------------  Chemistries  Recent Labs  Lab 03/08/18 0507  NA 137  K 4.4  CL 107  CO2 26  GLUCOSE 152*  BUN 20  CREATININE 1.49*  CALCIUM 8.1*   ------------------------------------------------------------------------------------------------------------------  Cardiac Enzymes No results for input(s): TROPONINI in the last 168 hours. ------------------------------------------------------------------------------------------------------------------  RADIOLOGY:  No results found.   ASSESSMENT AND PLAN:   67 year old female with past medical history of non-small cell lung cancer, diabetes, hypertension, hyperlipidemia, history of nephrolithiasis, carotid artery disease who presented to the hospital due to shortness of breath, chest pain and cough.  Acute massive pulmonary embolism- likely due to hypercoagulability in setting of non-small cell lung cancer. Hemodynamically stable. Venous dopplers negative for  DVT. - continue heparin drip - oncology consulted- will transition to eliquis on discharge - palliative  care consulted - PT/OT eval pending  Acute symptomatic anemia- likely acute blood loss possibly in the GI tract, although patient has not noticed anything. Hgb 9.2 > 7.6. Due to patient's massive PE, think benefits of continued anticoagulation outweigh risk of bleeding at this time. - discussed with onc- will transfuse 1u pRBCs today - initial FOBT was negative, will recheck - recheck cbc in the morning - will need to consult GI if hgb continues to drop  Acute hypoxic respiratory failure secondary to HCAP- improving. Remains on 2L O2 by [place].  - continue oral levaquin - wean O2 as tolerated - plan to ambulate with pulse ox prior to discharge - stop IVFs  Left flank pain/hydronephrosis- stable. Normal urine output. - seen by urology this admission who did not think patient warranted any acute intervention due to improvement in Cr - continue supportive care - patient to have repeat ultrasound done in the next 2 weeks.  Acute kidney injury- secondary to dehydration/nephrolithiasis/ intrinsic compression from underlying malignancy, also with recent contrast load for CTA chest. Improved this morning. Cr 1.68 > 1.49. - stop IVFs today - repeat BMP tomorrow  High grade non-small cell lung cancer with recurrence-seen by oncology.  Patient is /p chemoradiation but has now worsening recurrence.   - oncology consulted - palliative care consulted.  Essential hypertension- normotensive here. - continue metoprolol succinate 25mg  daily  Hyperlipidemia- stable. - continue Crestor.  Anxiety- stable - continue Ativan as needed.  Very poor prognosis. All the records are reviewed and case discussed with Care Management/Social Worker. Management plans discussed with the patient, her husband, son and daughter and they are in agreement.  CODE STATUS: DNR  TOTAL TIME TAKING CARE OF THIS  PATIENT: 40 minutes.   POSSIBLE D/C tomorrow, DEPENDING ON CLINICAL CONDITION.   Berna Spare Mayo M.D on 03/08/2018 at 2:38 PM  Between 7am to 6pm - Pager [phone]  After 6pm go to [email or web address] - Proofreader  [facility]  Office  [phone]  CC: Primary care physician; Crecencio Mc, MD

## 2018-03-08 NOTE — Progress Notes (Signed)
OT Cancellation Note  Patient Details Name: Dana Ramirez MRN: [id number] DOB: 11/18/1950   Cancelled Treatment:    Reason Eval/Treat Not Completed: Medical issues which prohibited therapy;Other (comment). Spoke with daughter in the hallway who reports pt has been doing a little better since a medication change but her Hgb is now 7.6. Dtr notes plans to re-check in pm to confirm. Dtr states goal for pt today is to "conserve energy". OT will hold therapy this date 2/2 family request and low Hgb falling outside guidelines for participation in therapy. Will continue to follow acutely and re-attempt OT tx next date as medically appropriate.  Jeni Salles, MPH, MS, OTR/L ascom [phone] 03/08/18, 11:44 AM

## 2018-03-08 NOTE — Progress Notes (Signed)
OT Cancellation Note  Patient Details Name: Kenzlei Runions MRN: [id number] DOB: 05/15/1951   Cancelled Treatment:    Reason Eval/Treat Not Completed: Other (comment). Spoke with PT after attempting to treat pt. Per PT, pt sleeping and upon arousal quickly falls back to sleep. Pt unable to keep eyes open to hold conversation. Pt's husband states that pt has been in severe pain. Requests therapy be held at this time and re-attempt at later time to allow pt time to rest. OT will try back at later date/time as pt is medically appropriate.  Jeni Salles, MPH, MS, OTR/L ascom [phone] 03/08/18, 9:22 AM

## 2018-03-08 NOTE — Consult Note (Signed)
ANTICOAGULATION CONSULT NOTE -  Pharmacy Consult for Heparin Drip  Indication: pulmonary embolus  Patient Measurements: Height: 5\' 3"  (160 cm) Weight: 145 lb (65.8 kg) IBW/kg (Calculated) : 52.4  Labs: Recent Labs    03/05/18 1419  03/06/18 0407  03/06/18 1612 03/07/18 0514 03/08/18 0507  HGB  --   --  9.5*  --   --  9.2* 7.6*  HCT  --   --  27.4*  --   --  26.9* 22.5*  PLT  --   --  146*  --   --  159 160  APTT 31  --   --   --   --   --   --   LABPROT 14.3  --   --   --   --   --   --   INR 1.12  --   --   --   --   --   --   HEPARINUNFRC 0.10*   < > 0.71*   < > 0.43 0.48 0.41  CREATININE  --   --  1.54*  --   --  1.68* 1.49*   < > = values in this interval not displayed.    Assessment: Pharmacy consulted heparin dosing in 67 yo female with PE.  Patient has been receiving enoxaparin 40mg  every 24 hours while admitted. Last enoxaparin dose @1816  on 8/16.   Heparin infusing at 1000 units/hr 8/18 16:12 Heparin level 0.43  Goal of Therapy:  Heparin level 0.3-0.7 units/ml Monitor platelets by anticoagulation protocol: Yes   Plan:  08/19 @ 0500 HL 0.48 therapeutic. Will continue current rate and will recheck HL w/ am labs. hgb trending down slightly but stable.  8/20 AM heparin level 0.41. Continue current regimen. Recheck heparin level and CBC with tomorrow AM labs. Hgb 9.2>> 7.6; no overt bleeding per RN.  Eloise Harman, PharmD Pharmacy Resident  03/08/2018 5:57 AM

## 2018-03-08 NOTE — Progress Notes (Signed)
PT Cancellation Note  Patient Details Name: Dana Ramirez MRN: [id number] DOB: 12-10-1950   Cancelled Treatment:     Chart reviewed. PT attempts treatment this morning however upon arrival pt sleeping and upon arousal quickly falls back to sleep. Pt unable to keep eyes open to hold conversation. Pts husband states that pt has been in severe pain and request that PT try back another time and allow pt to rest. PT will try back at later time/date when pt is medically appropriate.  Yolonda Kida, SPT   Demiya Magno 03/08/2018, 8:50 AM

## 2018-03-09 ENCOUNTER — Encounter: Payer: Self-pay | Admitting: Oncology

## 2018-03-09 ENCOUNTER — Ambulatory Visit: Payer: Medicare Other

## 2018-03-09 LAB — GLUCOSE, CAPILLARY
Glucose-Capillary: 223 mg/dL — ABNORMAL HIGH (ref 70–99)
Glucose-Capillary: 227 mg/dL — ABNORMAL HIGH (ref 70–99)
Glucose-Capillary: 333 mg/dL — ABNORMAL HIGH (ref 70–99)
Glucose-Capillary: 129 mg/dL — ABNORMAL HIGH (ref 70–99)

## 2018-03-09 LAB — BASIC METABOLIC PANEL
ANION GAP: 7 (ref 5–15)
BUN: 21 mg/dL (ref 8–23)
CALCIUM: 8.2 mg/dL — AB (ref 8.9–10.3)
CHLORIDE: 102 mmol/L (ref 98–111)
CO2: 26 mmol/L (ref 22–32)
Creatinine, Ser: 1.52 mg/dL — ABNORMAL HIGH (ref 0.44–1.00)
GFR calc non Af Amer: 35 mL/min — ABNORMAL LOW (ref >60)
GFR, EST AFRICAN AMERICAN: 40 mL/min — AB (ref >60)
Glucose, Bld: 226 mg/dL — ABNORMAL HIGH (ref 70–99)
POTASSIUM: 4.4 mmol/L (ref 3.5–5.1)
Sodium: 135 mmol/L (ref 135–145)

## 2018-03-09 LAB — CBC
HEMATOCRIT: 24.7 % — AB (ref 35.0–47.0)
HEMOGLOBIN: 8.7 g/dL — AB (ref 12.0–16.0)
MCH: 34.3 pg — ABNORMAL HIGH (ref 26.0–34.0)
MCHC: 35.4 g/dL (ref 32.0–36.0)
MCV: 96.6 fL (ref 80.0–100.0)
Platelets: 148 10*3/uL — ABNORMAL LOW (ref 150–440)
RBC: 2.55 MIL/uL — AB (ref 3.80–5.20)
RDW: 20.6 % — AB (ref 11.5–14.5)
WBC: 8.2 10*3/uL (ref 3.6–11.0)

## 2018-03-09 LAB — HEPARIN LEVEL (UNFRACTIONATED): HEPARIN UNFRACTIONATED: 0.42 [IU]/mL (ref 0.30–0.70)

## 2018-03-09 MED ORDER — PANTOPRAZOLE SODIUM 40 MG PO TBEC
40.0000 mg | DELAYED_RELEASE_TABLET | Freq: Two times a day (BID) | ORAL | Status: DC
  Administered 2018-03-09 – 2018-03-10 (×2): 40 mg via ORAL
  Filled 2018-03-09 (×2): qty 1

## 2018-03-09 MED ORDER — INSULIN ASPART 100 UNIT/ML ~~LOC~~ SOLN
5.0000 [IU] | Freq: Three times a day (TID) | SUBCUTANEOUS | Status: DC
  Administered 2018-03-10 (×2): 5 [IU] via SUBCUTANEOUS
  Filled 2018-03-09 (×2): qty 1

## 2018-03-09 MED ORDER — MORPHINE SULFATE (CONCENTRATE) 10 MG/0.5ML PO SOLN: 5.0000 mg | ORAL | 0 refills | Status: DC | PRN

## 2018-03-09 MED ORDER — APIXABAN 5 MG PO TABS: 5.0000 mg | ORAL_TABLET | Freq: Two times a day (BID) | ORAL | Status: DC

## 2018-03-09 MED ORDER — APIXABAN 5 MG PO TABS
10.0000 mg | ORAL_TABLET | Freq: Two times a day (BID) | ORAL | Status: DC
  Filled 2018-03-09: qty 2

## 2018-03-09 MED ORDER — HEPARIN (PORCINE) IN NACL 100-0.45 UNIT/ML-% IJ SOLN
1000.0000 [IU]/h | INTRAMUSCULAR | Status: AC
  Administered 2018-03-09: 19:00:00 1000 [IU]/h via INTRAVENOUS
  Filled 2018-03-09: qty 250

## 2018-03-09 MED ORDER — ONDANSETRON HCL 4 MG/2ML IJ SOLN
4.0000 mg | Freq: Four times a day (QID) | INTRAMUSCULAR | Status: DC | PRN
  Administered 2018-03-09 – 2018-03-10 (×2): 4 mg via INTRAVENOUS
  Filled 2018-03-09 (×2): qty 2

## 2018-03-09 MED ORDER — FUROSEMIDE 40 MG PO TABS
40.0000 mg | ORAL_TABLET | Freq: Once | ORAL | Status: AC
  Administered 2018-03-09: 40 mg via ORAL
  Filled 2018-03-09: qty 1

## 2018-03-09 MED ORDER — DOCUSATE SODIUM 100 MG PO CAPS: 100.0000 mg | ORAL_CAPSULE | Freq: Two times a day (BID) | ORAL | 0 refills | Status: DC | PRN

## 2018-03-09 NOTE — Progress Notes (Signed)
Hematology/Oncology Progress Note [facility] [phone] [phone]  Patient Care Team: Crecencio Mc, MD as PCP - General (Internal Medicine) Minna Merritts, MD (Cardiology) Telford Nab, RN as Registered Nurse Bary Castilla, Forest Gleason, MD (General Surgery) Earlie Server, MD as Medical Oncologist (Medical Oncology)   Name of the patient: Dana Ramirez  [id number]  1950-09-24  Date of visit: 03/09/18   INTERVAL HISTORY-  Patient was seen and examined bedside.  Reports some chills and feeling cold last night after blood transfusion.  No fever episodes. She worries about her Mediport as it did not"pop" after being assessed yesterday.  Also feel shortness of breath and took a morphine pill.  Review of Systems  Constitutional: Positive for Fatigue. Chills last night. No fever and weight loss.  HENT: Negative for nosebleeds and sore throat.   Eyes: Negative for double vision, photophobia and redness.  Respiratory: Positive for SOB. Negative for cough and wheezing.   Cardiovascular: Negative for chest pain, palpitations and orthopnea.  Gastrointestinal: Negative for abdominal pain, blood in stool, heartburn, nausea and vomiting.  Genitourinary: Negative for dysuria.  Musculoskeletal: Negative for back pain, myalgias and neck pain.  Skin: Negative for itching and rash.  Neurological: Positive for weakness. Negative for dizziness, tingling and tremors.  Endo/Heme/Allergies: Negative for environmental allergies. Does not bruise/bleed easily.  Psychiatric/Behavioral: Negative for depression and suicidal ideas.    Allergies  Allergen Reactions  . Codeine Nausea And Vomiting  . Lisinopril Cough  . Penicillins Itching    Has patient had a PCN reaction causing immediate rash, facial/tongue/throat swelling, SOB or lightheadedness with hypotension: no Has patient had a PCN reaction causing severe rash involving mucus membranes or skin necrosis:  no Has patient had a PCN reaction that required hospitalization: no Has patient had a PCN reaction occurring within the last 10 years: no If all of the above answers are "NO", then may proceed with Cephalosporin use.     Patient Active Problem List   Diagnosis Date Noted  . Nausea without vomiting   . Pulmonary emboli ([place])   . Shortness of breath   . AKI (acute kidney injury) ([place])   . Hydronephrosis, left   . Pneumonia 03/01/2018  . Supraclavicular lymphadenopathy 01/26/2018  . Goals of care, counseling/discussion 01/07/2018  . Lung cancer ([place]) 01/06/2018  . Malignant neoplasm of overlapping sites of right lung ([place]) 12/28/2017  . Wheezing 12/21/2017  . Mediastinal lymphadenopathy 12/21/2017  . Neck mass 12/08/2017  . Cervical radiculopathy at C8 11/06/2017  . GERD (gastroesophageal reflux disease) 04/10/2017  . 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 B12 deficiency 04/02/2016  . Cough in adult patient 12/28/2015  . Carpal tunnel syndrome 06/03/2015  . Atherosclerosis of coronary artery 06/03/2015  . Hypertension 06/03/2015  . Subclinical hypothyroidism 06/03/2015  . Diabetes mellitus without complication ([place]) 53/97/6734  . Carotid stenosis 12/21/2012  . Hyperlipidemia 12/19/2009  . Atherosclerosis of native coronary artery of native heart with stable angina pectoris ([place]) 12/19/2009     Past Medical History:  Diagnosis Date  . CAD (coronary artery disease)   . Carotid arterial disease ([place])   . Diabetes mellitus without complication ([place])   . History of vertigo   . Hyperlipidemia   . Hypertension   . Kidney stone      Past Surgical History:  Procedure Laterality Date  . BREAST CYST ASPIRATION Left 1980s  . CESAREAN SECTION     x 2  . CHOLECYSTECTOMY    . CORONARY ARTERY  BYPASS GRAFT  03/2005   LIMA to the LAD, vein graft to diagonal with a negative stress test in 06/2006  . PORTACATH PLACEMENT Left 12/31/2017   Procedure: INSERTION PORT-A-CATH;  Surgeon: Robert Bellow, MD;  Location: [facility] ORS;  Service: General;  Laterality: Left;  . SENTINEL NODE BIOPSY Right 12/31/2017   Procedure: SUPRACLAVICULAR NODE BIOPSY;  Surgeon: Robert Bellow, MD;  Location: [facility] ORS;  Service: General;  Laterality: Right;  . TONSILLECTOMY      Social History   Socioeconomic History  . Marital status: Married    Spouse name: Elenore Rota   . Number of children: 2  . Years of education: Not on file  . Highest education level: Not on file  Occupational History  . Occupation: Retired     Fish farm manager: OTHER    Comment: [employer]  Social Needs  . Financial resource strain: Not on file  . Food insecurity:    Worry: Not on file    Inability: Not on file  . Transportation needs:    Medical: Not on file    Non-medical: Not on file  Tobacco Use  . Smoking status: Never Smoker  . Smokeless tobacco: Never Used  Substance and Sexual Activity  . Alcohol use: No  . Drug use: No  . Sexual activity: Not on file  Lifestyle  . Physical activity:    Days per week: Not on file    Minutes per session: Not on file  . Stress: Not on file  Relationships  . Social connections:    Talks on phone: Not on file    Gets together: Not on file    Attends religious service: Not on file    Active member of club or organization: Not on file    Attends meetings of clubs or organizations: Not on file    Relationship status: Not on file  . Intimate partner violence:    Fear of current or ex partner: Not on file    Emotionally abused: Not on file    Physically abused: Not on file    Forced sexual activity: Not on file  Other Topics Concern  . Not on file  Social History Narrative   Married   Gets regular exercise, once or twice every week     Family History  Problem Relation Age of Onset  . Heart disease Mother   . Heart disease Father 16  . Stroke Sister 4  . Heart disease Sister   . Diabetes Brother   . Coronary artery disease Other        Strong family history of CAD and  CABG  . Heart disease Maternal Grandmother   . Breast cancer Maternal Grandmother   . Heart disease Maternal Grandfather   . Heart disease Paternal Grandmother   . Breast cancer Paternal Grandmother 48  . Heart disease Paternal Grandfather      Current Facility-Administered Medications:  .  acetaminophen (TYLENOL) tablet 650 mg, 650 mg, Oral, Q6H PRN, Paticia Stack, RPH, 650 mg at 03/08/18 1814 .  albuterol (PROVENTIL) (2.5 MG/3ML) 0.083% nebulizer solution 3 mL, 3 mL, Inhalation, Q6H PRN, Vaughan Basta, MD, 3 mL at 03/04/18 1922 .  apixaban (ELIQUIS) tablet 10 mg, 10 mg, Oral, BID **FOLLOWED BY** [START ON 03/15/2018] apixaban (ELIQUIS) tablet 5 mg, 5 mg, Oral, BID, Paticia Stack, [facility] .  aspirin EC tablet 81 mg, 81 mg, Oral, BID, Vaughan Basta, MD, 81 mg at 03/08/18 2303 .  benzonatate (TESSALON) capsule 200  mg, 200 mg, Oral, Q6H PRN, Pyreddy, Pavan, MD, 200 mg at 03/08/18 0002 .  chlorpheniramine-HYDROcodone (TUSSIONEX) 10-8 MG/5ML suspension 5 mL, 5 mL, Oral, Q12H PRN, Vaughan Basta, MD, 5 mL at 03/07/18 0446 .  cholecalciferol (VITAMIN D) tablet 1,000 Units, 1,000 Units, Oral, Daily, Vaughan Basta, MD, 1,000 Units at 03/08/18 0800 .  docusate sodium (COLACE) capsule 100 mg, 100 mg, Oral, BID PRN, Vaughan Basta, MD .  docusate sodium (COLACE) capsule 100 mg, 100 mg, Oral, BID, Vaughan Basta, MD, 100 mg at 03/08/18 2303 .  guaiFENesin-dextromethorphan (ROBITUSSIN DM) 100-10 MG/5ML syrup 5 mL, 5 mL, Oral, Q4H PRN, Demetrios Loll, MD, 5 mL at 03/08/18 0758 .  insulin aspart (novoLOG) injection 0-15 Units, 0-15 Units, Subcutaneous, TID WC, Mayo, Pete Pelt, MD, 5 Units at 03/09/18 0754 .  insulin aspart (novoLOG) injection 0-5 Units, 0-5 Units, Subcutaneous, QHS, Mayo, Pete Pelt, MD, 2 Units at 03/08/18 2255 .  insulin glargine (LANTUS) injection 10 Units, 10 Units, Subcutaneous, Daily, Demetrios Loll, MD, 10 Units at 03/08/18 [phone] .   ipratropium-albuterol (DUONEB) 0.5-2.5 (3) MG/3ML nebulizer solution 3 mL, 3 mL, Nebulization, Once, Sridharan, Prasanna, MD .  lactulose ([place]) 10 GM/15ML solution 30 g, 30 g, Oral, BID PRN, Henreitta Leber, MD, 30 g at 03/07/18 1816 .  LORazepam (ATIVAN) injection 0.5 mg, 0.5 mg, Intravenous, Q8H PRN, Amelia Jo, MD, 0.5 mg at 03/05/18 0836 .  LORazepam (ATIVAN) tablet 0.5 mg, 0.5 mg, Oral, Q8H PRN, Vaughan Basta, MD, 0.5 mg at 03/08/18 2256 .  MEDLINE mouth rinse, 15 mL, Mouth Rinse, BID, Sainani, Belia Heman, MD, 15 mL at 03/08/18 0801 .  metoprolol succinate (TOPROL-XL) 24 hr tablet 25 mg, 25 mg, Oral, Daily, Vaughan Basta, MD, 25 mg at 03/08/18 0800 .  morphine CONCENTRATE 10 MG/0.5ML oral solution 5 mg, 5 mg, Oral, Q4H PRN, Amelia Jo, MD, 5 mg at 03/09/18 0753 .  pantoprazole (PROTONIX) EC tablet 40 mg, 40 mg, Oral, Daily, Vaughan Basta, MD, 40 mg at 03/08/18 0758 .  predniSONE (DELTASONE) tablet 5 mg, 5 mg, Oral, Q breakfast, Vaughan Basta, MD, 5 mg at 03/09/18 0753 .  promethazine (PHENERGAN) injection 12.5 mg, 12.5 mg, Intravenous, Q6H PRN, Earlie Server, MD .  rosuvastatin (CRESTOR) tablet 20 mg, 20 mg, Oral, QPM, Vaughan Basta, MD, 20 mg at 03/08/18 1814 .  sodium chloride flush (NS) 0.9 % injection 10-40 mL, 10-40 mL, Intracatheter, PRN, Verdell Carmine, Belia Heman, MD .  vitamin B-12 (CYANOCOBALAMIN) tablet 1,000 mcg, 1,000 mcg, Oral, Daily, Vaughan Basta, MD, 1,000 mcg at 03/08/18 0800   Physical exam:  Vitals:   03/08/18 2148 03/08/18 2245 03/09/18 0043 03/09/18 0507  BP: 133/70  131/68 117/84  Pulse: 98  (!) 130 98  Resp: (!) 22  (!) 22 15  Temp: 98.2 F (36.8 C) 98.3 F (36.8 C) 98.9 F (37.2 C) 98.3 F (36.8 C)  TempSrc: Oral Oral Oral Oral  SpO2: 94%  95% 100%  Weight:      Height:       GENERAL: No distress, frail appearance, sitting in the chair SKIN:  No rashes or significant lesions  HEAD: Normocephalic, No  masses, lesions, tenderness or abnormalities  EYES: Conjunctiva are pale. non icteric LUNGS: diminished breath sound on right lung. No wheezing.  HEART: Regular rate & rhythm, no murmurs, no gallops, S1 normal and S2 normal  ABDOMEN: Abdomen soft, non-tender, normal bowel sounds, I did not appreciate any  masses or organomegaly  MUSCULOSKELETAL: No CVA tenderness and no tenderness  on percussion of the back or rib cage.  EXTREMITIES: Bilateral lower extremity trace edema,  NEURO: Alert & oriented, no focal motor/sensory deficits. Skin: left chest wall medi port currently accessed. No erythema or swelling.      CMP Latest Ref Rng & Units 03/09/2018  Glucose 70 - 99 mg/dL 226(H)  BUN 8 - 23 mg/dL 21  Creatinine 0.44 - 1.00 mg/dL 1.52(H)  Sodium 135 - 145 mmol/L 135  Potassium 3.5 - 5.1 mmol/L 4.4  Chloride 98 - 111 mmol/L 102  CO2 22 - 32 mmol/L 26  Calcium 8.9 - 10.3 mg/dL 8.2(L)  Total Protein 6.5 - 8.1 g/dL -  Total Bilirubin 0.3 - 1.2 mg/dL -  Alkaline Phos 38 - 126 U/L -  AST 15 - 41 U/L -  ALT 0 - 44 U/L -   CBC Latest Ref Rng & Units 03/09/2018  WBC 3.6 - 11.0 K/uL 8.2  Hemoglobin 12.0 - 16.0 g/dL 8.7(L)  Hematocrit 35.0 - 47.0 % 24.7(L)  Platelets 150 - 440 K/uL 148(L)   RADIOGRAPHIC STUDIES: I have personally reviewed the radiological images as listed and agreed with the findings in the report. Ct Abdomen Pelvis Wo Contrast  Result Date: 03/01/2018 CLINICAL DATA:  History of non small cell lung carcinoma with fevers and nausea EXAM: CT ABDOMEN AND PELVIS WITHOUT CONTRAST TECHNIQUE: Multidetector CT imaging of the abdomen and pelvis was performed following the standard protocol without IV contrast. COMPARISON:  12/24/2017 PET-CT FINDINGS: Lower chest: Chronic changes are noted in the right lower lobe stable from the prior PET-CT is well as a CT examination from 12/29/2014. Some slight increased interstitial changes are noted which may be related to lymphangitic spread of  carcinoma. The left lung base is within normal limits. Along the inferior aspect of the right hemidiaphragm posteriorly there is a 1.8 cm soft tissue nodule best seen on image number 21 of series 2. When compare with the prior PET-CT this was not present and likely represents a metastatic focus. Hepatobiliary: No focal liver abnormality is seen. Status post cholecystectomy. No biliary dilatation. Pancreas: Unremarkable. No pancreatic ductal dilatation or surrounding inflammatory changes. Spleen: Normal in size without focal abnormality. Adrenals/Urinary Tract: Adrenal glands are within normal limits. Bladder is decompressed. Kidneys are well visualized bilaterally. Some mild hydronephrotic changes are noted on the left which extend inferiorly into the mid ureter. No definitive stone is seen although some soft tissue thickening of the ureter is noted. Possibility of underlying mass lesion cannot be totally excluded. The bladder is partially distended. Stomach/Bowel: Scattered diverticular change of the colon is noted without evidence of diverticulitis. The appendix is within normal limits. No small bowel abnormality is seen. Vascular/Lymphatic: Aortic atherosclerosis. No enlarged abdominal or pelvic lymph nodes. Reproductive: Uterus and bilateral adnexa are unremarkable. Other: No abdominal wall hernia or abnormality. No abdominopelvic ascites. Musculoskeletal: Degenerative changes of the lumbar spine are seen. IMPRESSION: Changes in the right lung base predominately chronic in nature although some changes consistent with lymphangitic spread of carcinoma are present. These are new from the prior PET-CT. Nodule along the right hemidiaphragm posteriorly likely representing a metastatic deposit. Obstructive changes of the left renal collecting system and proximal ureter. Some thickening of the ureter is seen which may represent a focal lesion. Clinical correlation is recommended. Although not mentioned in the body of  the report there are 2 peritoneal soft tissue lesions identified. The largest of these lies on image number 49 of series 2 measuring approximately 18 mm in dimension.  This may also represent some metastatic disease. Repeat PET-CT when the patient's condition improves may be helpful. Electronically Signed   By: Inez Catalina M.D.   On: 03/01/2018 21:12   Ct Chest Wo Contrast  Result Date: 03/01/2018 CLINICAL DATA:  Cough and shortness of breath beginning last night. The patient is undergoing chemotherapy and radiation therapy for lung carcinoma. EXAM: CT CHEST WITHOUT CONTRAST TECHNIQUE: Multidetector CT imaging of the chest was performed following the standard protocol without IV contrast. COMPARISON:  PET CT scan 12/24/2017. FINDINGS: Cardiovascular: The patient is status post CABG. Heart size is normal. Calcific aortic atherosclerosis is identified. No pericardial effusion. Mediastinum/Nodes: Previously seen 1.5 cm short axis dimension right supraclavicular node measures 0.8 cm on image 5 of series 2 today. Right suprahilar mass seen on the prior examination measures 4.4 x 2.7 cm today on image 32 compared to 4.9 x 3.6 cm on the prior study (remeasurement). The lesion abuts the right subclavian artery as on the prior exam. A right infrahilar mass lesion encasing the right mainstem bronchus measures 3.9 x 3.9 cm on image 67 compared to 4.3 x 4.3 cm on the prior exam. There is less mass effect on the right mainstem bronchus. The thyroid gland and esophagus are unremarkable. Lungs/Pleura: No pleural effusion. Thickened pleura and pleural calcification in the right lung base are unchanged. The lungs are emphysematous. Increased airspace opacity in the posterior right lower lobe since the prior exam is seen medially and worrisome for pneumonia. Scattered, patchy areas of ground-glass attenuation throughout the right lung are not grossly changed. The left lung is clear. Upper Abdomen: A soft tissue nodule measuring  1.8 x 1.6 cm on image 125 posterior to the medial limb of the right adrenal gland is new since the prior study. Also seen is new fullness of the visualized left intrarenal collecting system which is new since the prior exam. Musculoskeletal: No lytic or sclerotic lesion is identified. IMPRESSION: Some increase in airspace opacity in the right lower lobe worrisome for pneumonia. Lung carcinoma with metastatic lymphadenopathy appears improved as described above. Visualized left intrarenal collecting system is dilated worrisome for hydronephrosis, new since the prior examination. Cause for this finding is not identified. Renal ultrasound is recommended for further evaluation. New soft tissue nodule along the medial aspect of the posterior right hemidiaphragm is worrisome for metastatic disease. Aortic Atherosclerosis (ICD10-I70.0) and Emphysema (ICD10-J43.9). Electronically Signed   By: Inge Rise M.D.   On: 03/01/2018 12:37   Ct Angio Chest Pe W Or Wo Contrast  Result Date: 03/05/2018 CLINICAL DATA:  67 year old with worsening shortness of breath and chest discomfort. Right lung cancer. EXAM: CT ANGIOGRAPHY CHEST WITH CONTRAST TECHNIQUE: Multidetector CT imaging of the chest was performed using the standard protocol during bolus administration of intravenous contrast. Multiplanar CT image reconstructions and MIPs were obtained to evaluate the vascular anatomy. CONTRAST:  76mL ISOVUE-370 IOPAMIDOL (ISOVUE-370) INJECTION 76% COMPARISON:  03/01/2018 FINDINGS: Cardiovascular: There is minimal blood flow in the main right pulmonary artery related to a large thrombus burden. There is a small amount of thrombus extending into the main pulmonary artery and near the origin of the left main pulmonary artery. Scattered emboli in the segmental branches of the left pulmonary arteries. There is no blood flow within the lobar and segmental right pulmonary arteries. Evidence of previous CABG procedure. No significant  pericardial fluid. Mediastinum/Nodes: Again noted is large amount of soft tissue in the right hilum and right side of the mediastinum. The soft tissue  is encompassing the right bronchus intermedius. This soft tissue mass may have enlarged in size measuring up to 6.2 cm on sequence 5 image 47 previously measuring roughly 3.9 cm. However, it is difficult to measure this tissue due to the adjacent volume loss in the right lung. Large amount of soft tissue in the right upper mediastinum measuring up to 3.7 cm in thickness on sequence 5, 28 and previously measured roughly 3.0 cm on the recent comparison examination. Probable small supraclavicular lymph nodes. Lungs/Pleura: Trachea is patent. Right upper lobe is patent. There is marked narrowing throughout the right bronchus intermedius. Markedly decreased aeration in the right lower lobe with extensive consolidation. The patient has developed a small right pleural effusion. There appears to be no aeration to the right middle lobe. Increased patchy parenchymal densities throughout the right upper lung. Densities along the medial left upper lobe probably related to atelectasis. Upper Abdomen: Again noted is a soft tissue nodule along the posterior right hemidiaphragm measuring roughly 2.0 cm. Small peritoneal nodule in the left upper quadrant on image 76. There is persistent moderate left hydronephrosis. There is at least mild right hydronephrosis. Musculoskeletal: Again noted are enlarged lymph nodes in the left sub pectoralis region. Again noted is a left chest Port-A-Cath. The catheter tip is near the superior cavoatrial junction. Review of the MIP images confirms the above findings. IMPRESSION: Massive pulmonary embolism involving the main right pulmonary artery with complete occlusion of the distal right pulmonary artery and right pulmonary artery branches. Right pulmonary artery is adjacent to the known mediastinal tumor and tumor thrombus cannot be excluded.  Positive for small pulmonary emboli within left pulmonary artery branches. Extensive volume loss throughout the right lung related to compression on the airways to the right lower lobe and right middle lobe. Aeration to the right lower lobe has markedly decreased compared 03/01/2018 and patient has developed a small right pleural effusion. In addition, there is now patchy disease throughout the right upper lung. Soft tissue masses in the mediastinum and right hilum may have enlarged since 03/01/18 but difficult to accurately measure due to the extensive volume loss in the right hemithorax. Again noted is metastatic disease in the upper abdomen and left sub pectoralis region. Again noted is bilateral hydronephrosis, left side greater than right. Critical Value/emergent results were called by telephone at the time of interpretation on 03/05/2018 at 11:42 am to Dr. Demetrios Loll , who verbally acknowledged these results. Electronically Signed   By: Markus Daft M.D.   On: 03/05/2018 11:56   US Renal  Result Date: 03/02/2018 CLINICAL DATA:  67 year old female with LEFT hydronephrosis. History of RIGHT lung cancer. EXAM: RENAL / URINARY TRACT ULTRASOUND COMPLETE COMPARISON:  03/01/2018 CT and prior studies FINDINGS: Right Kidney: Length: 10.7 cm. Echogenicity within normal limits. No mass or hydronephrosis visualized. Left Kidney: Length: 12 cm. Mild LEFT hydronephrosis appears decreased from recent CT. Echogenicity within normal limits. No solid mass visualized. Bladder: Appears normal for degree of bladder distention. IMPRESSION: 1. Mild LEFT hydronephrosis, which appears decreased from 03/01/2018 CT. 2. No other significant abnormalities. Electronically Signed   By: Margarette Canada M.D.   On: 03/02/2018 10:46   US Venous Img Lower Bilateral  Result Date: 03/05/2018 CLINICAL DATA:  67 year old with lung cancer and pulmonary embolism. EXAM: BILATERAL LOWER EXTREMITY VENOUS DOPPLER ULTRASOUND TECHNIQUE: Gray-scale  sonography with graded compression, as well as color Doppler and duplex ultrasound were performed to evaluate the lower extremity deep venous systems from the level of the  common femoral vein and including the common femoral, femoral, profunda femoral, popliteal and calf veins including the posterior tibial, peroneal and gastrocnemius veins when visible. The superficial great saphenous vein was also interrogated. Spectral Doppler was utilized to evaluate flow at rest and with distal augmentation maneuvers in the common femoral, femoral and popliteal veins. COMPARISON:  None. FINDINGS: RIGHT LOWER EXTREMITY Common Femoral Vein: No evidence of thrombus. Normal compressibility, respiratory phasicity and response to augmentation. Saphenofemoral Junction: No evidence of thrombus. Normal compressibility and flow on color Doppler imaging. Profunda Femoral Vein: No evidence of thrombus. Normal compressibility and flow on color Doppler imaging. Femoral Vein: No evidence of thrombus. Normal compressibility, respiratory phasicity and response to augmentation. Popliteal Vein: No evidence of thrombus. Normal compressibility, respiratory phasicity and response to augmentation. Calf Veins: Visualized right deep calf veins are patent without thrombus. Limited evaluation of the peroneal veins. LEFT LOWER EXTREMITY Common Femoral Vein: No evidence of thrombus. Normal compressibility, respiratory phasicity and response to augmentation. Saphenofemoral Junction: No evidence of thrombus. Normal compressibility and flow on color Doppler imaging. Profunda Femoral Vein: No evidence of thrombus. Normal compressibility and flow on color Doppler imaging. Femoral Vein: No evidence of thrombus. Normal compressibility, respiratory phasicity and response to augmentation. Popliteal Vein: No evidence of thrombus. Normal compressibility, respiratory phasicity and response to augmentation. Calf Veins: Visualized left deep calf veins are patent without  thrombus. Limited evaluation of the peroneal veins. IMPRESSION: No evidence of deep venous thrombosis in the lower extremities. Electronically Signed   By: Markus Daft M.D.   On: 03/05/2018 18:24    Assessment and plan- Patient is a 67 y.o. female with history of CAD, diabetes, hyperlipidemia, hypertension kidney stone recent diagnosis of high-grade non-small cell lung cancer on chemo plus radiation, presents with cough, fever, acute kidney failure  #Pneumonia, afebrile. Continue oral Levaquin. Marland Kitchen  #High-grade carcinoma of lung,Plan outpatient immunotherapy with Keytruda on 03/11/2018  # Goal of care discussed. Palliative intent.     #AKI, kidney function stable.  #Dyspnea, secondary to PE/lung cancer progression.  Continue low dose morphine 5mg   PRN for dyspnea.   # Symtomatic anemia. S/p PRBC transfusion yesterday. Hb improved appropriately. .   # Lower extremity swelling, trace, stable.   # Massive PE, due to aggressive lung cancer. Continue heparin gtt during inpatient. Upon discharge, switch to Eliquis. [ADAM VTE trial supports use of Eliquis in patient with cancer].   Discussed with Dr.Mayo. If she is stable in the afternoon, can be discharged.   Thank you for allowing me to participate in the care of this patient.  Total face to face encounter time for this patient visit was 25 min. >50% of the time was  spent in counseling and coordination of care.  Earlie Server, MD, PhD Hematology Oncology [facility] Pager- [phone] 03/09/2018

## 2018-03-09 NOTE — Plan of Care (Signed)

## 2018-03-09 NOTE — Care Management (Signed)
Correction: Patient is not active with [facility].

## 2018-03-09 NOTE — Progress Notes (Signed)
Family questioned stopping the heparin drip and starting eliquis because patient will not be discharging today. MD paged, verbal orders received to continue heparin drip at current rate and we will discontinue the heparin tomorrow and start the eliquis then. Ammie Dalton, RN

## 2018-03-09 NOTE — Discharge Instructions (Signed)
It was a pleasure meeting you during this hospitalization!  You came into the hospital because you were having fevers and feeling bad. We found out that you had a pneumonia. You were treated with antibiotics for 8 days and do not need to continue antibiotics when you leave the hospital.  While you were here, you developed a large blood clot in your lungs. We have been treating you with IV heparin (blood thinner). We will discharge you home with eliquis, which is blood thinner that you take by mouth.  I have also prescribed some morphine to use as needed for pain at home.  Please make sure you follow-up with Dr. Tasia Catchings as soon as possible.  -Dr. Brett Albino

## 2018-03-09 NOTE — Progress Notes (Signed)
[place] [facility] [place] NAME: Dana Ramirez    MR#:  [id number]  DATE OF BIRTH:  07-Sep-1950  SUBJECTIVE:   Had a rough night and did not sleep well. Had a lot of shivering during the blood transfusion. Feeling very tired this morning and concerned about being able to walk at home. Family has concerns about safety at home.  REVIEW OF SYSTEMS:    Review of Systems  Constitutional: Positive for malaise/fatigue. Negative for chills and fever.  HENT: Negative for congestion and tinnitus.   Eyes: Negative for blurred vision and double vision.  Respiratory: Positive for cough. Negative for shortness of breath and wheezing.   Cardiovascular: Negative for chest pain, orthopnea and PND.  Gastrointestinal: Negative for abdominal pain, diarrhea, nausea and vomiting.  Genitourinary: Negative for dysuria and hematuria.  Neurological: Positive for weakness. Negative for dizziness, sensory change and focal weakness.  All other systems reviewed and are negative.   Nutrition: Heart Healthy/Carb control Tolerating Diet: Yes Tolerating PT: Await Eval.   DRUG ALLERGIES:   Allergies  Allergen Reactions  . Codeine Nausea And Vomiting  . Lisinopril Cough  . Penicillins Itching    Has patient had a PCN reaction causing immediate rash, facial/tongue/throat swelling, SOB or lightheadedness with hypotension: no Has patient had a PCN reaction causing severe rash involving mucus membranes or skin necrosis: no Has patient had a PCN reaction that required hospitalization: no Has patient had a PCN reaction occurring within the last 10 years: no If all of the above answers are "NO", then may proceed with Cephalosporin use.     VITALS:  Blood pressure 121/76, pulse 93, temperature 98.3 F (36.8 C), temperature source Oral, resp. rate 18, height 5\' 3"  (1.6 m), weight 65.8 kg, SpO2 99 %.  PHYSICAL EXAMINATION:   Physical Exam  GENERAL:  67 y.o.-year-old patient  laying in bed in NAD.  Tired-appearing, but non-toxic. EYES: Pupils equal, round, reactive to light and accommodation. No scleral icterus. Extraocular muscles intact.  HEENT: Head atraumatic, normocephalic. Oropharynx and nasopharynx clear. MMM. NECK:  Supple, no jugular venous distention. No thyroid enlargement, no tenderness.  LUNGS: CTAB, no wheezing or crackles. No use of accessory muscles of respiration. [place] in place. CARDIOVASCULAR: Tachycardic, regular rhythm, S1, S2 normal. No murmurs, rubs, or gallops.  ABDOMEN: Soft, nontender, nondistended. Bowel sounds present. No organomegaly or mass.  EXTREMITIES: No cyanosis, clubbing. +pitting edema in the ankles/feet bilaterally. NEUROLOGIC: Cranial nerves II through XII are intact. +global weakness. Sensation intact to light touch. PSYCHIATRIC: The patient is alert and oriented x 3. Flat affect. SKIN: No obvious rash, lesion, or ulcer.    LABORATORY PANEL:   CBC Recent Labs  Lab 03/09/18 0532  WBC 8.2  HGB 8.7*  HCT 24.7*  PLT 148*   ------------------------------------------------------------------------------------------------------------------  Chemistries  Recent Labs  Lab 03/09/18 0532  NA 135  K 4.4  CL 102  CO2 26  GLUCOSE 226*  BUN 21  CREATININE 1.52*  CALCIUM 8.2*   ------------------------------------------------------------------------------------------------------------------  Cardiac Enzymes No results for input(s): TROPONINI in the last 168 hours. ------------------------------------------------------------------------------------------------------------------  RADIOLOGY:  No results found.   ASSESSMENT AND PLAN:   67 year old female with past medical history of non-small cell lung cancer, diabetes, hypertension, hyperlipidemia, history of nephrolithiasis, carotid artery disease who presented to the hospital due to shortness of breath, chest pain and cough.  Acute massive pulmonary embolism- likely  due to hypercoagulability in setting of non-small cell lung cancer. Hemodynamically stable. Venous  dopplers negative for DVT. On room air today. - stop heparin drip and transition to eliquis today - oncology consulted- plan to start [facility] as an outpatient - palliative care consulted - PT/OT to re-evaluate today - ambulate with pulse ox  Acute symptomatic anemia- likely acute blood loss possibly in the GI tract, although patient has not noticed anything. Anemia panel unremarkable this admission. S/p 1u pRBCs 8/20 per onc recommendations. Hgb stable this morning. - initial FOBT was negative, repeat FOBT pending - recheck cbc in the morning - will need to consult GI if hgb continues to drop  Acute hypoxic respiratory failure secondary to HCAP- improving.  - finished course of levaquin - wean O2 as tolerated - ambulate with pulse ox today  Lower extremity edema- worsened after blood transfusion - given lasix 40mg  po x 1 today, which has helped in the past - re-evaluate for additional doses tomorrow  Left flank pain/hydronephrosis- stable. Normal urine output. - seen by urology this admission who did not think patient warranted any acute intervention due to improvement in Cr - continue supportive care - patient to have repeat ultrasound done in the next 2 weeks.  Acute kidney injury- secondary to dehydration/nephrolithiasis/ intrinsic compression from underlying malignancy, also with recent contrast load for CTA chest. Improved. - repeat BMP tomorrow  High grade non-small cell lung cancer with recurrence-seen by oncology.  Patient is /p chemoradiation but has now worsening recurrence.   - oncology consulted - palliative care consulted  Essential hypertension- normotensive here. - continue metoprolol succinate 25mg  daily  Type 2 diabetes- blood sugars have been in the 200s - moderate SSI - add novolog 5 units tid with meals  Hyperlipidemia- stable. - continue  Crestor.  Anxiety- stable - continue Ativan as needed.  Very poor prognosis. All the records are reviewed and case discussed with Care Management/Social Worker. Management plans discussed with the patient, her husband, son and daughter and they are in agreement.  CODE STATUS: DNR  TOTAL TIME TAKING CARE OF THIS PATIENT: 45 minutes.   POSSIBLE D/C tomorrow, DEPENDING ON CLINICAL CONDITION.   Berna Spare Mayo M.D on 03/09/2018 at 3:00 PM  Between 7am to 6pm - Pager - [phone]  After 6pm go to [email or web address] - Proofreader  [employer]  [facility]  Office  [phone]  CC: Primary care physician; Crecencio Mc, MD

## 2018-03-09 NOTE — Care Management (Signed)
Spoke with son, Elenore Rota. He is agreeable to home health services at discharge with [facility]. He would like RNCM to call daughter back in the am regarding DME needs so they can decide on shower bench. Patient will need a walker. WIll follow up in the am.

## 2018-03-09 NOTE — Consult Note (Signed)
ANTICOAGULATION CONSULT NOTE -  Pharmacy Consult for Heparin Drip  Indication: pulmonary embolus  Patient Measurements: Height: 5\' 3"  (160 cm) Weight: 145 lb (65.8 kg) IBW/kg (Calculated) : 52.4  Labs: Recent Labs    03/07/18 0514 03/08/18 0507 03/09/18 0532  HGB 9.2* 7.6* 8.7*  HCT 26.9* 22.5* 24.7*  PLT 159 160 148*  HEPARINUNFRC 0.48 0.41 0.42  CREATININE 1.68* 1.49* 1.52*    Assessment: Pharmacy consulted heparin dosing in 67 yo female with PE.  Patient has been receiving enoxaparin 40mg  every 24 hours while admitted. Last enoxaparin dose @1816  on 8/16.   Heparin infusing at 1000 units/hr 8/18 16:12 Heparin level 0.43  Goal of Therapy:  Heparin level 0.3-0.7 units/ml Monitor platelets by anticoagulation protocol: Yes   Plan:  08/19 @ 0500 HL 0.48 therapeutic. Will continue current rate and will recheck HL w/ am labs. hgb trending down slightly but stable.  8/20 AM heparin level 0.41. Continue current regimen. Recheck heparin level and CBC with tomorrow AM labs. Hgb 9.2>> 7.6; no overt bleeding per RN.  8/21 AM heparin level 0.42. Continue current regimen. Recheck heparin level and CBC with tomorrow AM labs.   Eloise Harman, PharmD Pharmacy Resident  03/09/2018 6:16 AM

## 2018-03-09 NOTE — Progress Notes (Signed)
Inpatient Diabetes Program Recommendations  AACE/ADA: New Consensus Statement on Inpatient Glycemic Control (2019)  Target Ranges:  Prepandial:   less than 140 mg/dL      Peak postprandial:   less than 180 mg/dL (1-2 hours)      Critically ill patients:  140 - 180 mg/dL   Results for REILLY, MOLCHAN" (MRN [id number]) as of 03/09/2018 10:15  Ref. Range 03/08/2018 07:51 03/08/2018 11:45 03/08/2018 18:02 03/08/2018 22:09 03/09/2018 07:29  Glucose-Capillary Latest Ref Range: 70 - 99 mg/dL 149 (H) 311 (H) 232 (H) 218 (H) 223 (H)   Review of Glycemic Control  Diabetes history: DM2 Outpatient Diabetes medications: Januvia 100 mg daily, Metformin 1000 mg QPM Current orders for Inpatient glycemic control: Lantus 10 units daily, Novolog 0-15 units TID with meals, Novolog 0-5 units QHS; Prednisone 5 mg QAM  Inpatient Diabetes Program Recommendations:  Insulin - Meal Coverage: Please consider ordering Novolog 5 units TID with meals for meal coverage if patient eats at least 50% of meals.  Thanks, Barnie Alderman, RN, MSN, CDE Diabetes Coordinator Inpatient Diabetes Program [phone] (Team Pager from 8am to 5pm)

## 2018-03-09 NOTE — Care Management Important Message (Signed)
Important Message  Patient Details  Name: Dana Ramirez MRN: [id number] Date of Birth: [birth year]   Medicare Important Message Given:  Yes    Juliann Pulse A Autymn Omlor 03/09/2018, 9:03 AM

## 2018-03-09 NOTE — Progress Notes (Signed)
Patient Education  Counseled patient and family on apixaban for PE. Discussed indication, dose, transition from 10 mg BID to 5 mg BID, side effects, informing providers pre-procedures, and avoiding NSAID use, etc. Family and patient acknowledged understanding.

## 2018-03-09 NOTE — Consult Note (Signed)
ANTICOAGULATION CONSULT NOTE - Initial Consult  Pharmacy Consult for Apixaban Indication: pulmonary embolus  Allergies  Allergen Reactions  . Codeine Nausea And Vomiting  . Lisinopril Cough  . Penicillins Itching    Has patient had a PCN reaction causing immediate rash, facial/tongue/throat swelling, SOB or lightheadedness with hypotension: no Has patient had a PCN reaction causing severe rash involving mucus membranes or skin necrosis: no Has patient had a PCN reaction that required hospitalization: no Has patient had a PCN reaction occurring within the last 10 years: no If all of the above answers are "NO", then may proceed with Cephalosporin use.     Patient Measurements: Height: 5\' 3"  (160 cm) Weight: 145 lb (65.8 kg) IBW/kg (Calculated) : 52.4   Vital Signs: Temp: 98.3 F (36.8 C) (08/21 0507) Temp Source: Oral (08/21 0507) BP: 117/84 (08/21 0507) Pulse Rate: 98 (08/21 0507)  Labs: Recent Labs    03/07/18 0514 03/08/18 0507 03/09/18 0532  HGB 9.2* 7.6* 8.7*  HCT 26.9* 22.5* 24.7*  PLT 159 160 148*  HEPARINUNFRC 0.48 0.41 0.42  CREATININE 1.68* 1.49* 1.52*    Estimated Creatinine Clearance: 33.7 mL/min (A) (by C-G formula based on SCr of 1.52 mg/dL (H)).   Medical History: Past Medical History:  Diagnosis Date  . CAD (coronary artery disease)   . Carotid arterial disease ([place])   . Diabetes mellitus without complication ([place])   . History of vertigo   . Hyperlipidemia   . Hypertension   . Kidney stone     Medications:  Scheduled:  . apixaban  10 mg Oral BID   Followed by  . [START ON 03/10/2018] apixaban  5 mg Oral BID  . aspirin EC  81 mg Oral BID  . cholecalciferol  1,000 Units Oral Daily  . docusate sodium  100 mg Oral BID  . insulin aspart  0-15 Units Subcutaneous TID WC  . insulin aspart  0-5 Units Subcutaneous QHS  . insulin glargine  10 Units Subcutaneous Daily  . ipratropium-albuterol  3 mL Nebulization Once  . mouth rinse  15 mL Mouth  Rinse BID  . metoprolol succinate  25 mg Oral Daily  . pantoprazole  40 mg Oral Daily  . predniSONE  5 mg Oral Q breakfast  . rosuvastatin  20 mg Oral QPM  . cyanocobalamin  1,000 mcg Oral Daily    Assessment: Patient has acute massive PE likely due to hypercoagulability in setting of NSCLC. Was on heparin drip at goal range, but now starting PO apixaban for home therapy.     Plan:  Initiated Apixaban 10 mg PO BID x 7 days, and then apixaban 5 mg PO BID thereafter (this dose starts 3545). Apixaban to start after heparin drip discontinued.    Paticia Stack, PharmD Pharmacy Resident  03/09/2018 8:46 AM

## 2018-03-09 NOTE — Progress Notes (Signed)
Physical Therapy Treatment Patient Details Name: Dana Ramirez MRN: [id number] DOB: 06-Dec-1950 Today's Date: 03/09/2018    History of Present Illness Pt presents to hospital on 03/01/18 for complaints of weakness and SOB. Pt subsequently diagnosed with pneumonia, AKI, and hydonephrosis. Pt is currently undergoing chemotherapy for non small cell lung cancer. Pts other PMH includes CAD, DM, vertigo, HLD, and kidney stones. Pts hospital stay complicated by PE on 2/99/37, pt was held per protocol for 48 hours after PE diagnosis.    PT Comments    Pt seen this afternoon and states that she is doing well after a multiple day stretch of not feeling well. Pt declines any pain throughout session. Pt instructed in and tolerates B LE there-ex well. Pt amb 40' with CGA and RW with one seated rest break half way. Pt fatigues with amb however quickly recovers with seated rest break. Pts vitals monitored throughout session, HR ranges from 103 at rest to 114 with amb. Pts O2 saturation assessed 99% on 2L at onset of session, weaned to RA at rest with saturation 97%. During amb pts saturation decreased to 92% however quickly elevated to 98% once resting. Pt left on RA, saturation at end of session 97% RN notified. PT spent extensive time educating pt and family as well as answering questions about d/c and safety with d/c. Family states that ultimately they would like for pt to go home and that they would be able to provide 24/7 care for pt. Family expresses concern about imminent d/c. PT suggest at this time for [facility] at D/c. Pt could benefit from continued skilled therapy at this time to improve deficits toward PLOF. PT will continue to work with pt at least 2x/week while admitted. D/c recommendations at this time remain home with HHPT.   Follow Up Recommendations  Home health PT;Supervision/Assistance - 24 hour     Equipment Recommendations  Rolling walker with 5" wheels;3in1 (PT)    Recommendations for  Other Services       Precautions / Restrictions Precautions Precautions: None Restrictions Weight Bearing Restrictions: No    Mobility  Bed Mobility               General bed mobility comments: Not assessed this visit. Pt up in chair upon arrival and prefers to stay up upon completion  Transfers Overall transfer level: Modified independent Equipment used: Rolling walker (2 wheeled) Transfers: Sit to/from Stand Sit to Stand: Supervision         General transfer comment: Pt transfers sit<>stand with close supervision several times without difficulty. Pt demonstrates safe use of UEs and RW during transfer  Ambulation/Gait Ambulation/Gait assistance: Min guard Gait Distance (Feet): 40 Feet Assistive device: Rolling walker (2 wheeled) Gait Pattern/deviations: Step-through pattern;Decreased step length - right;Decreased step length - left;Trunk flexed     General Gait Details: Pt amb 40' total with CGA and RW. Pt requires one seated rest break half way. Gait pattern improves with verbal cuing to correct increased trunk flexion and decreased stride length   Stairs             Wheelchair Mobility    Modified Rankin (Stroke Patients Only)       Balance                                            Cognition Arousal/Alertness: Awake/alert Behavior During Therapy: [facility]  for tasks assessed/performed Overall Cognitive Status: Within Functional Limits for tasks assessed                                 General Comments: A & O x4      Exercises Other Exercises Other Exercises: Pt instructed in and tolerates well B LE ther-ex including SLR, LAQ, seated and standing marching x12 reps each. Other Exercises: Extensive pt and family education about d/c recommendations and safety with d/c    General Comments        Pertinent Vitals/Pain Pain Assessment: No/denies pain Pain Score: 0-No pain Pain Intervention(s): Monitored during  session    Home Living                      Prior Function            PT Goals (current goals can now be found in the care plan section) Acute Rehab PT Goals Patient Stated Goal: to go home and get back to normal routine PT Goal Formulation: With patient Time For Goal Achievement: 2018/04/02 Potential to Achieve Goals: Fair Progress towards PT goals: Progressing toward goals    Frequency    Min 2X/week      PT Plan Current plan remains appropriate    Co-evaluation              AM-PAC PT "6 Clicks" Daily Activity  Outcome Measure  Difficulty turning over in bed (including adjusting bedclothes, sheets and blankets)?: None Difficulty moving from lying on back to sitting on the side of the bed? : None Difficulty sitting down on and standing up from a chair with arms (e.g., wheelchair, bedside commode, etc,.)?: None Help needed moving to and from a bed to chair (including a wheelchair)?: A Little Help needed walking in hospital room?: A Little Help needed climbing 3-5 steps with a railing? : A Lot 6 Click Score: 20    End of Session Equipment Utilized During Treatment: Gait belt Activity Tolerance: Patient tolerated treatment well;Patient limited by fatigue Patient left: in chair;with call bell/phone within reach;with chair alarm set;with family/visitor present   PT Visit Diagnosis: Unsteadiness on feet (R26.81);Other abnormalities of gait and mobility (R26.89);Muscle weakness (generalized) (M62.81);Difficulty in walking, not elsewhere classified (R26.2);Pain     Time: 6811-5726 PT Time Calculation (min) (ACUTE ONLY): 27 min  Charges:  $Therapeutic Exercise: 8-22 mins $Therapeutic Activity: 8-22 mins                     [employer], SPT    Dana Ramirez 03/09/2018, 4:05 PM

## 2018-03-09 NOTE — Progress Notes (Signed)
Daily Progress Note   Patient Name: Dana Ramirez       Date: 03/09/2018 DOB: 11/19/1950  Age: 67 y.o. MRN#: [id number] Attending Physician: Sela Hua, MD Primary Care Physician: Crecencio Mc, MD Admit Date: 03/01/2018  Reason for Consultation/Follow-up: Establishing goals of care, Non pain symptom management, Pain control and Psychosocial/spiritual support  Subjective: Patient is lying in bed. She is A&O x3. Daughter is at bedside. Patient continues on heparin drip for PE. She reports pain is a 2 at this time and she is comfortable. Patient is hopeful that she will get to go home soon. Reports feeling much better on today. States she is pleased that she is getting some much needed rest during the night. She does complain of some shortness of breath and weakness with exertion. Both daughter and patient verbalizes that they are remaining hopeful that she will continue to show signs of improvement and be able to discharge home and start [facility] per Oncology recommendations.   Patient verbalizes that her current pain regimen with Roxanol is working. She expressed that she feels as though she tolerates the Roxanol better than the IV morphine. She reports having intermittent episodes of coughing spells which causes some pain and discomfort. She feels as though the Tessalon and Robitussin is appropriately controlling her cough. She is aware that she has Tussionex available also but does not like to use unless her coughing is really bad. She reports she feels like a "scratchiness" in her throat at times which causes her coughing. Denies pain or complications with eating or drinking. States the feeling is intermittent with no contributing factors. Denies post nasal drip or congestion. Discussed  the possibility of using Claritin or something similar, however, she feels it is manageable at this time. She reports her appetite is much improved since admission. We discussed patient wishes once discharged and daughter and patient both verbalized that she continues to agree that they would like the support of Palliative outpatient.   Chart Reviewed.   Length of Stay: 8  Current Medications: Scheduled Meds:  . apixaban  10 mg Oral BID   Followed by  . [START ON 02/22/2018] apixaban  5 mg Oral BID  . aspirin EC  81 mg Oral BID  . cholecalciferol  1,000 Units Oral Daily  . docusate sodium  100 mg Oral BID  . insulin aspart  0-15 Units Subcutaneous TID WC  . insulin aspart  0-5 Units Subcutaneous QHS  . insulin glargine  10 Units Subcutaneous Daily  . mouth rinse  15 mL Mouth Rinse BID  . metoprolol succinate  25 mg Oral Daily  . pantoprazole  40 mg Oral Daily  . predniSONE  5 mg Oral Q breakfast  . rosuvastatin  20 mg Oral QPM  . cyanocobalamin  1,000 mcg Oral Daily    Continuous Infusions:   PRN Meds: acetaminophen, albuterol, benzonatate, chlorpheniramine-HYDROcodone, docusate sodium, guaiFENesin-dextromethorphan, lactulose, LORazepam, LORazepam, morphine CONCENTRATE, ondansetron (ZOFRAN) IV, promethazine, sodium chloride flush  Physical Exam  Constitutional: She is oriented to person, place, and time. She appears well-developed. She is cooperative. She appears ill.  Frail appearance   Cardiovascular: Normal rate, regular rhythm, normal heart sounds and normal pulses.  Pulmonary/Chest: She has decreased breath sounds in the right lower field and the left lower field.  Shortness of breath at times   Neurological: She is alert and oriented to person, place, and time.  Psychiatric: Judgment normal. Cognition and memory are normal.  Nursing note and vitals reviewed.          Vital Signs: BP 121/76 (BP Location: Left Arm)   Pulse 93   Temp 98.3 F (36.8 C) (Oral)   Resp 18    Ht 5\' 3"  (1.6 m)   Wt 65.8 kg   SpO2 99%   BMI 25.69 kg/m  SpO2: SpO2: 99 % O2 Device: O2 Device: Nasal Cannula O2 Flow Rate: O2 Flow Rate (L/min): 2 L/min  Intake/output summary:   Intake/Output Summary (Last 24 hours) at 03/09/2018 1108 Last data filed at 03/09/2018 4818 Gross per 24 hour  Intake 359 ml  Output -  Net 359 ml   LBM: Last BM Date: 03/05/18 Baseline Weight: Weight: 65.8 kg Most recent weight: Weight: 65.8 kg      Palliative Assessment/Data: PPS 40 %   Patient Active Problem List   Diagnosis Date Noted  . Nausea without vomiting   . Pulmonary emboli ([place])   . Shortness of breath   . AKI (acute kidney injury) ([place])   . Hydronephrosis, left   . Pneumonia 03/01/2018  . Supraclavicular lymphadenopathy 01/26/2018  . Goals of care, counseling/discussion 01/07/2018  . Lung cancer ([place]) 01/06/2018  . Malignant neoplasm of overlapping sites of right lung ([place]) 12/28/2017  . Wheezing 12/21/2017  . Mediastinal lymphadenopathy 12/21/2017  . Neck mass 12/08/2017  . Cervical radiculopathy at C8 11/06/2017  . GERD (gastroesophageal reflux disease) 04/10/2017  . 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 B12 deficiency 04/02/2016  . Cough in adult patient 12/28/2015  . Carpal tunnel syndrome 06/03/2015  . Atherosclerosis of coronary artery 06/03/2015  . Hypertension 06/03/2015  . Subclinical hypothyroidism 06/03/2015  . Diabetes mellitus without complication ([place]) 56/31/4970  . Carotid stenosis 12/21/2012  . Hyperlipidemia 12/19/2009  . Atherosclerosis of native coronary artery of native heart with stable angina pectoris ([place]) 12/19/2009    Palliative Care Assessment & Plan   Patient Profile: 67 y.o. female admitted on 03/01/2018 from home with complaints of right flank pain, nausea, and shortness of breath. 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 Patient was [facility] at the [facility] prior to admission with a fever, cough, fatigue, and weakness. CT of chest showed possible pneumonia and also  metastatic lymphadenopathy showed some improvement, however there was also a new soft tissue nodule along the right diaphragm concerning for metastatic disease. . Due to nausea she was unable to take oral antibiotics and was suggested to be seen for possible admission. She was given IV dose of Levaquin in the clinic along with morphine and Zofran. Since admission patient continues to complaint of weakness, shortness of breath, and left flank pain. She has been seen by Urology and Oncology. Palliative Medicine team consulted for goals of care discussion.   Recommendations/Plan:  DNR/DNI-as confirmed by patient and family   Continue to treat the treatable while hospitalized. Patient and family remains hopeful for her improvement and the ability to start [facility] after discharge per recommendations of Dr. Tasia Catchings, Oncologist.  Patient verbalizes pain is being controlled with Tylenol and Roxanol as needed.   Patient verbalizes her cough is being controlled with Tessalon/Robitussin as needed. She is aware that she has Tussionex also as needed.   Patient and family would like the support of Outpatient Palliative at discharge if able to proceed with immunotherapy.   Palliative Medicine team will continue to support patient, family, and medical team during hospitalization.    Goals of Care and Additional Recommendations:  Limitations on Scope of Treatment: Full Scope Treatment-continue to treat the treatable.   Code Status:    Code Status Orders  (From admission, onward)         Start     Ordered   03/04/18 0509  Do not attempt resuscitation (DNR)  Continuous    Question Answer Comment  In the event of cardiac or respiratory ARREST Do not call a "code blue"   In the event of cardiac or respiratory ARREST Do not  perform Intubation, CPR, defibrillation or ACLS   In the event of cardiac or respiratory ARREST Use medication by any route, position, wound care, and other measures to relive pain and suffering. May use oxygen, suction and manual treatment of airway obstruction as needed for comfort.      03/04/18 0508        Code Status History    Date Active Date Inactive Code Status Order ID Comments User Context   03/03/2018 1206 03/04/2018 0508 DNR [id number]  Jimmy Footman, NP Inpatient   03/01/2018 1749 03/03/2018 1206 Full Code [id number]  Vaughan Basta, MD Inpatient       Prognosis:   Unable to determine-Guarded to poor long-term in the setting of pneumonia, massive PE, diabetes, hypertension, hydronephrosis, metastatic non small cell lung cancer IIIB, decreased po intake, shortness of breath, weakness, and deconditioning.   Discharge Planning:  Home with Home Health and outpatient Palliative at family's request.   Care plan was discussed with patient, family, bedside RN, and Dr. Tasia Catchings.   Thank you for allowing the Palliative Medicine Team to assist in the care of this patient.   Total Time 35 min.  Prolonged Time Billed  NO       Greater than 50%  of this time was spent counseling and coordinating care related to the above assessment and plan.  Alda Lea, NP-BC Palliative Medicine Team  Phone: [phone] Fax: [phone] Pager: [phone] Amion: Bjorn Pippin    Please contact Palliative Medicine Team phone at [phone] for questions and concerns.

## 2018-03-09 NOTE — Care Management (Signed)
Spoke with daughter regarding discharge. She wants patient reevaluated by PT today for possible SNF placement due to her recent PE. She states PT hasn't seen patient in a week and she feels patient needs to be seen for evaluation for proper discharge disposition. Spoke with Caryl Pina with PT and updated her on daughter concerns. She will see patient as soon as possible.

## 2018-03-10 ENCOUNTER — Ambulatory Visit: Payer: Medicare Other

## 2018-03-10 LAB — BASIC METABOLIC PANEL
Anion gap: 7 (ref 5–15)
BUN: 23 mg/dL (ref 8–23)
CO2: 28 mmol/L (ref 22–32)
CREATININE: 1.48 mg/dL — AB (ref 0.44–1.00)
Calcium: 8.6 mg/dL — ABNORMAL LOW (ref 8.9–10.3)
Chloride: 97 mmol/L — ABNORMAL LOW (ref 98–111)
GFR calc Af Amer: 42 mL/min — ABNORMAL LOW (ref >60)
GFR calc non Af Amer: 36 mL/min — ABNORMAL LOW (ref >60)
GLUCOSE: 237 mg/dL — AB (ref 70–99)
Potassium: 3.9 mmol/L (ref 3.5–5.1)
SODIUM: 132 mmol/L — AB (ref 135–145)

## 2018-03-10 LAB — CBC
HCT: 27.5 % — ABNORMAL LOW (ref 35.0–47.0)
Hemoglobin: 9.3 g/dL — ABNORMAL LOW (ref 12.0–16.0)
MCH: 31.9 pg (ref 26.0–34.0)
MCHC: 34 g/dL (ref 32.0–36.0)
MCV: 93.6 fL (ref 80.0–100.0)
PLATELETS: 147 10*3/uL — AB (ref 150–440)
RBC: 2.93 MIL/uL — ABNORMAL LOW (ref 3.80–5.20)
RDW: 19.7 % — ABNORMAL HIGH (ref 11.5–14.5)
WBC: 8.1 10*3/uL (ref 3.6–11.0)

## 2018-03-10 LAB — GLUCOSE, CAPILLARY
GLUCOSE-CAPILLARY: 188 mg/dL — AB (ref 70–99)
Glucose-Capillary: 208 mg/dL — ABNORMAL HIGH (ref 70–99)

## 2018-03-10 MED ORDER — APIXABAN 5 MG PO TABS: ORAL_TABLET | ORAL | 0 refills | Status: DC

## 2018-03-10 MED ORDER — APIXABAN 5 MG PO TABS
10.0000 mg | ORAL_TABLET | Freq: Two times a day (BID) | ORAL | Status: DC
  Administered 2018-03-10: 10 mg via ORAL

## 2018-03-10 MED ORDER — ALBUTEROL SULFATE HFA 108 (90 BASE) MCG/ACT IN AERS: 2.0000 | INHALATION_SPRAY | Freq: Four times a day (QID) | RESPIRATORY_TRACT | 0 refills | Status: AC | PRN

## 2018-03-10 MED ORDER — HEPARIN SOD (PORK) LOCK FLUSH 100 UNIT/ML IV SOLN
500.0000 [IU] | Freq: Once | INTRAVENOUS | Status: AC
  Administered 2018-03-10: 500 [IU] via INTRAVENOUS
  Filled 2018-03-10: qty 5

## 2018-03-10 MED ORDER — APIXABAN 5 MG PO TABS: 5.0000 mg | ORAL_TABLET | Freq: Two times a day (BID) | ORAL | Status: DC

## 2018-03-10 NOTE — Consult Note (Signed)
ANTICOAGULATION CONSULT NOTE - Initial Consult  Pharmacy Consult for Apixaban Indication: pulmonary embolus  Allergies  Allergen Reactions  . Codeine Nausea And Vomiting  . Lisinopril Cough  . Penicillins Itching    Has patient had a PCN reaction causing immediate rash, facial/tongue/throat swelling, SOB or lightheadedness with hypotension: no Has patient had a PCN reaction causing severe rash involving mucus membranes or skin necrosis: no Has patient had a PCN reaction that required hospitalization: no Has patient had a PCN reaction occurring within the last 10 years: no If all of the above answers are "NO", then may proceed with Cephalosporin use.     Patient Measurements: Height: 5\' 3"  (160 cm) Weight: 145 lb (65.8 kg) IBW/kg (Calculated) : 52.4   Vital Signs: Temp: 97.6 F (36.4 C) (08/22 0750) Temp Source: Oral (08/22 0750) BP: 123/84 (08/22 0750) Pulse Rate: 100 (08/22 0750)  Labs: Recent Labs    03/08/18 0507 03/09/18 0532 03/10/18 0515  HGB 7.6* 8.7* 9.3*  HCT 22.5* 24.7* 27.5*  PLT 160 148* 147*  HEPARINUNFRC 0.41 0.42  --   CREATININE 1.49* 1.52* 1.48*    Estimated Creatinine Clearance: 34.6 mL/min (A) (by C-G formula based on SCr of 1.48 mg/dL (H)).   Medical History: Past Medical History:  Diagnosis Date  . CAD (coronary artery disease)   . Carotid arterial disease ([place])   . Diabetes mellitus without complication ([place])   . History of vertigo   . Hyperlipidemia   . Hypertension   . Kidney stone     Medications:  Scheduled:  . apixaban  10 mg Oral BID   Followed by  . [START ON Apr 08, 2018] apixaban  5 mg Oral BID  . aspirin EC  81 mg Oral BID  . cholecalciferol  1,000 Units Oral Daily  . docusate sodium  100 mg Oral BID  . insulin aspart  0-15 Units Subcutaneous TID WC  . insulin aspart  0-5 Units Subcutaneous QHS  . insulin aspart  5 Units Subcutaneous TID WC  . insulin glargine  10 Units Subcutaneous Daily  . mouth rinse  15 mL Mouth  Rinse BID  . metoprolol succinate  25 mg Oral Daily  . pantoprazole  40 mg Oral BID  . predniSONE  5 mg Oral Q breakfast  . rosuvastatin  20 mg Oral QPM  . cyanocobalamin  1,000 mcg Oral Daily    Assessment: Patient has acute massive PE likely due to hypercoagulability in setting of NSCLC. Was on heparin drip at goal range, but now starting PO apixaban for home therapy.     Plan:  Patient initially was to start apixaban 0821, but was not given and heparin drip was restarted. No apixaban was given on 0821.  0822 Initiated Apixaban 10 mg PO BID x 7 days, and then apixaban 5 mg PO BID thereafter (this dose starts 9357). Apixaban to start after heparin drip discontinued. Nurse is aware.    Paticia Stack, PharmD Pharmacy Resident  03/10/2018 8:32 AM

## 2018-03-10 NOTE — Discharge Summary (Addendum)
[facility] NAME: Dana Ramirez    MR#:  [id number]  DATE OF BIRTH:  07-27-1950  DATE OF ADMISSION:  03/01/2018 ADMITTING PHYSICIAN: Vaughan Basta, MD  DATE OF DISCHARGE: 03/10/2018  PRIMARY CARE PHYSICIAN: Crecencio Mc, MD    ADMISSION DIAGNOSIS:  Pneumonia Hydronephrosis lung cancer  DISCHARGE DIAGNOSIS:  Principal Problem:   Pneumonia Active Problems:   AKI (acute kidney injury) ([place])   Hydronephrosis, left   Nausea without vomiting   Pulmonary emboli ([facility])   Shortness of breath   SECONDARY DIAGNOSIS:   Past Medical History:  Diagnosis Date  . CAD (coronary artery disease)   . Carotid arterial disease ([place])   . Diabetes mellitus without complication ([place])   . History of vertigo   . Hyperlipidemia   . Hypertension   . Kidney stone     HOSPITAL COURSE:   1.  Acute massive pulmonary embolism diagnosed on 03/05/2018.  Patient was started on heparin drip.  Seen by vascular surgery and they did not want to do any intervention.  Seen by oncology and they recommended going on Eliquis as outpatient. 2.  Acute symptomatic anemia.  Received a couple units of transfusion during the hospital course. 3.  Acute hypoxic respiratory failure secondary to pneumonia.  Patient finished a course of Levaquin in the hospital for pneumonia.  Off oxygen at this point. 4.  Left flank pain and hydronephrosis.  Seen by urology and no further intervention needed 5.  Non-small cell lung cancer.  Follow-up with Dr. Tasia Catchings as outpatient.  Patient is a DNR. 6.  Acute kidney injury resolved 7.  Essential hypertension continue metoprolol 8.  Type 2 diabetes can go back on Glucophage as outpatient and Januvia 9.  Hyperlipidemia unspecified on Crestor 10.  Weakness.  Physical therapy.  Recommended home with home health.  DME walker, DME tub stool, DME commode ordered 11.  Outpatient palliative care to follow.  DISCHARGE CONDITIONS:    [place]  CONSULTS OBTAINED:  Treatment Team:  Earlie Server, MD Hollice Espy, MD Evaristo Bury, MD  DRUG ALLERGIES:   Allergies  Allergen Reactions  . Codeine Nausea And Vomiting  . Lisinopril Cough  . Penicillins Itching    Has patient had a PCN reaction causing immediate rash, facial/tongue/throat swelling, SOB or lightheadedness with hypotension: no Has patient had a PCN reaction causing severe rash involving mucus membranes or skin necrosis: no Has patient had a PCN reaction that required hospitalization: no Has patient had a PCN reaction occurring within the last 10 years: no If all of the above answers are "NO", then may proceed with Cephalosporin use.     DISCHARGE MEDICATIONS:   Allergies as of 03/10/2018      Reactions   Codeine Nausea And Vomiting   Lisinopril Cough   Penicillins Itching   Has patient had a PCN reaction causing immediate rash, facial/tongue/throat swelling, SOB or lightheadedness with hypotension: no Has patient had a PCN reaction causing severe rash involving mucus membranes or skin necrosis: no Has patient had a PCN reaction that required hospitalization: no Has patient had a PCN reaction occurring within the last 10 years: no If all of the above answers are "NO", then may proceed with Cephalosporin use.      Medication List    STOP taking these medications   glipiZIDE 2.5 MG 24 hr tablet Commonly known as:  GLUCOTROL XL   naproxen sodium 220 MG tablet Commonly known as:  ALEVE  TAKE these medications   acetaminophen 650 MG CR tablet Commonly known as:  TYLENOL Take 650 mg by mouth every 8 (eight) hours as needed for pain.   albuterol 108 (90 Base) MCG/ACT inhaler Commonly known as:  PROVENTIL HFA;VENTOLIN HFA Inhale 2 puffs into the lungs every 6 (six) hours as needed for wheezing or shortness of breath.   apixaban 5 MG Tabs tablet Commonly known as:  ELIQUIS Two tabs po twice a day for seven days then one tab po twice a day  afterwards   ASPIR-LOW 81 MG EC tablet Generic drug:  aspirin Take 81 mg by mouth 2 (two) times daily.   chlorpheniramine-HYDROcodone 10-8 MG/5ML Suer Commonly known as:  TUSSIONEX Take 5 mLs by mouth every 12 (twelve) hours as needed for cough.   cyanocobalamin 1000 MCG tablet Take 1,000 mcg by mouth daily.   cyclobenzaprine 10 MG tablet Commonly known as:  FLEXERIL Take 1 tablet (10 mg total) by mouth 3 (three) times daily as needed for muscle spasms.   docusate sodium 100 MG capsule Commonly known as:  COLACE Take 1 capsule (100 mg total) by mouth 2 (two) times daily as needed for mild constipation.   guaiFENesin-dextromethorphan 100-10 MG/5ML syrup Commonly known as:  ROBITUSSIN DM Take 5 mLs by mouth every 4 (four) hours as needed for cough (chest congestion).   JANUVIA 100 MG tablet Generic drug:  sitaGLIPtin TAKE ONE TABLET BY MOUTH DAILY What changed:  how much to take   LORazepam 0.5 MG tablet Commonly known as:  ATIVAN Take 1 tablet (0.5 mg total) by mouth every 8 (eight) hours as needed for anxiety (nausea).   metFORMIN 1000 MG tablet Commonly known as:  GLUCOPHAGE TAKE ONE TABLET BY MOUTH DAILY What changed:  when to take this   metoprolol succinate 25 MG 24 hr tablet Commonly known as:  TOPROL-XL TAKE 1 TABLET DAILY   morphine CONCENTRATE 10 MG/0.5ML Soln concentrated solution Take 0.25 mLs (5 mg total) by mouth every 4 (four) hours as needed for up to 7 days for moderate pain or severe pain.   omeprazole 20 MG tablet Commonly known as:  PRILOSEC OTC Take 20 mg by mouth daily.   predniSONE 5 MG tablet Commonly known as:  DELTASONE Take 1 tablet (5 mg total) by mouth daily with breakfast.   rosuvastatin 20 MG tablet Commonly known as:  CRESTOR TAKE ONE TABLET BY MOUTH DAILY What changed:  when to take this   silver sulfADIAZINE 1 % cream Commonly known as:  SILVADENE Apply 1 application topically 2 (two) times daily.   traMADol 50 MG  tablet Commonly known as:  ULTRAM Take 1 tablet (50 mg total) by mouth every 6 (six) hours as needed.   Vitamin D3 1000 units Caps Take 1,000 Units by mouth daily.            Durable Medical Equipment  (From admission, onward)         Start     Ordered   03/10/18 0825  For home use only DME Tub bench  Once    Comments:  Dx unsteady gait   03/10/18 0824   03/10/18 0824  For home use only DME Bedside commode  Once    Question:  Patient needs a bedside commode to treat with the following condition  Answer:  Unsteady gait   03/10/18 0824   03/10/18 0823  For home use only DME Walker rolling  Once    Question:  Patient needs a  walker to treat with the following condition  Answer:  Unsteady gait   03/10/18 0824   03/04/18 1633  For home use only DME Tub bench  Once    Comments:  Transfer Bench   03/04/18 1637   03/04/18 1618  For home use only DME Walker rolling  Once    Question:  Patient needs a walker to treat with the following condition  Answer:  Lung cancer ([place])   03/04/18 1618           DISCHARGE INSTRUCTIONS:   Follow-up Dr. Tasia Catchings tomorrow Follow-up PMD 5 days  If you experience worsening of your admission symptoms, develop shortness of breath, life threatening emergency, suicidal or homicidal thoughts you must seek medical attention immediately by calling 911 or calling your MD immediately  if symptoms less severe.  You Must read complete instructions/literature along with all the possible adverse reactions/side effects for all the Medicines you take and that have been prescribed to you. Take any new Medicines after you have completely understood and accept all the possible adverse reactions/side effects.   Please note  You were cared for by a hospitalist during your hospital stay. If you have any questions about your discharge medications or the care you received while you were in the hospital after you are discharged, you can call the unit and asked to speak with  the hospitalist on call if the hospitalist that took care of you is not available. Once you are discharged, your primary care physician will handle any further medical issues. Please note that NO REFILLS for any discharge medications will be authorized once you are discharged, as it is imperative that you return to your primary care physician (or establish a relationship with a primary care physician if you do not have one) for your aftercare needs so that they can reassess your need for medications and monitor your lab values.    Today   CHIEF COMPLAINT:  No chief complaint on file.   HISTORY OF PRESENT ILLNESS:  Dana Ramirez  is a 67 y.o. female with a known history of non-small lung cancer.  Sent in for pneumonia   VITAL SIGNS:  Blood pressure (!) 89/54, pulse 97, temperature 98.3 F (36.8 C), temperature source Oral, resp. rate 20, height 5\' 3"  (1.6 m), weight 65.8 kg, SpO2 95 %.  Blood pressure when I saw her this morning was 123/84  PHYSICAL EXAMINATION:  GENERAL:  67 y.o.-year-old patient lying in the bed with no acute distress.  EYES: Pupils equal, round, reactive to light and accommodation. No scleral icterus. Extraocular muscles intact.  HEENT: Head atraumatic, normocephalic. Oropharynx and nasopharynx clear.  NECK:  Supple, no jugular venous distention. No thyroid enlargement, no tenderness.  LUNGS: Decreased breath sounds bilateral bases, no wheezing, rales,rhonchi or crepitation. No use of accessory muscles of respiration.  CARDIOVASCULAR: S1, S2 normal. No murmurs, rubs, or gallops.  ABDOMEN: Soft, non-tender, non-distended. Bowel sounds present. No organomegaly or mass.  EXTREMITIES: No pedal edema, cyanosis, or clubbing.  NEUROLOGIC: Cranial nerves II through XII are intact. Muscle strength 5/5 in all extremities. Sensation intact. Gait not checked.  PSYCHIATRIC: The patient is alert and oriented x 3.  SKIN: No obvious rash, lesion, or ulcer.   DATA REVIEW:    CBC Recent Labs  Lab 03/10/18 0515  WBC 8.1  HGB 9.3*  HCT 27.5*  PLT 147*    Chemistries  Recent Labs  Lab 03/10/18 0515  NA 132*  K 3.9  CL 97*  CO2 28  GLUCOSE 237*  BUN 23  CREATININE 1.48*  CALCIUM 8.6*    Microbiology Results  Results for orders placed or performed during the hospital encounter of 03/01/18  CULTURE, BLOOD (ROUTINE X 2) w Reflex to ID Panel     Status: None   Collection Time: 03/01/18  6:09 PM  Result Value Ref Range Status   Specimen Description BLOOD BLOOD RIGHT HAND  Final   Special Requests   Final    BOTTLES DRAWN AEROBIC AND ANAEROBIC Blood Culture adequate volume   Culture   Final    NO GROWTH 5 DAYS Performed at [facility], [address] [place], [place] [address]    Report Status 03/06/2018 FINAL  Final  CULTURE, BLOOD (ROUTINE X 2) w Reflex to ID Panel     Status: None   Collection Time: 03/01/18  6:51 PM  Result Value Ref Range Status   Specimen Description BLOOD BLOOD LEFT HAND  Final   Special Requests   Final    BOTTLES DRAWN AEROBIC AND ANAEROBIC Blood Culture results may not be optimal due to an inadequate volume of blood received in culture bottles   Culture   Final    NO GROWTH 5 DAYS Performed at [facility], [address] [place], [place] [address]    Report Status 03/06/2018 FINAL  Final  MRSA PCR Screening     Status: None   Collection Time: 03/01/18 10:11 PM  Result Value Ref Range Status   MRSA by PCR NEGATIVE NEGATIVE Final    Comment:        The GeneXpert MRSA Assay (FDA approved for NASAL specimens only), is one component of a comprehensive MRSA colonization surveillance program. It is not intended to diagnose MRSA infection nor to guide or monitor treatment for MRSA infections. Performed at [facility], [address], [place], [place] [address]      Management plans discussed with the patient, family and they are in agreement.  CODE STATUS:     Code  Status Orders  (From admission, onward)         Start     Ordered   03/04/18 0509  Do not attempt resuscitation (DNR)  Continuous    Question Answer Comment  In the event of cardiac or respiratory ARREST Do not call a "code blue"   In the event of cardiac or respiratory ARREST Do not perform Intubation, CPR, defibrillation or ACLS   In the event of cardiac or respiratory ARREST Use medication by any route, position, wound care, and other measures to relive pain and suffering. May use oxygen, suction and manual treatment of airway obstruction as needed for comfort.      03/04/18 0508        Code Status History    Date Active Date Inactive Code Status Order ID Comments User Context   03/03/2018 1206 03/04/2018 0508 DNR [id number]  Jimmy Footman, NP Inpatient   03/01/2018 1749 03/03/2018 1206 Full Code [id number]  Vaughan Basta, MD Inpatient      TOTAL TIME TAKING CARE OF THIS PATIENT: 35 minutes.    Loletha Grayer M.D on 03/10/2018 at 2:32 PM  Between 7am to 6pm - Pager - [phone]  After 6pm go to [email or web address] - password [email or web address]  [facility] Office  [phone]  CC: Primary care physician; Crecencio Mc, MD

## 2018-03-10 NOTE — Plan of Care (Signed)

## 2018-03-10 NOTE — Progress Notes (Signed)
Pt for discharge home. Alert. No distress. Instructions discussed with pts spouse and dtr. presc given and discussed/ home meds discussed/ diet/ activity and f/u discussed. Verbalized understanding. Port flushed and deaccessed. Peripheral iv d/cd.  Home via w/c at this time w/o c/o.

## 2018-03-10 NOTE — Care Management Note (Signed)
Case Management Note  Patient Details  Name: Dana Ramirez MRN: [id number] Date of Birth: Jun 24, 1951  Subjective/Objective:  Spoke with daughter, Margreta Journey. She is requesting a walker and a bedside commode. Ordered from [facility] with [facility]. Notified Leroy Libman with [facility] of discharge. Patient will discharge home by car.                 Action/Plan:   Expected Discharge Date:  03/10/18               Expected Discharge Plan:  [place]  In-House Referral:     Discharge planning Services  CM Consult  Post Acute Care Choice:    Choice offered to:  Adult Children  DME Arranged:  Bedside commode, Walker rolling(Walker, transfer bench) DME Agency:  [place] Arranged:  RN, PT, OT, Nurse's Aide, Social Work [place]  Status of Service:  Completed, signed off  If discussed at [employer], dates discussed:    Additional Comments:  Jolly Mango, RN 03/10/2018, 11:00 AM

## 2018-03-10 NOTE — Progress Notes (Addendum)
Pts dtr christina  called me to say [facility] did not have pts eliquis presc. Notified dr Earleen Newport  To tell him. And gave him ht number. Dr Earleen Newport called me back to say pt would be switched to xalralto 15 mg x 21 days then 20 mg  Notified  pts dtr of this and  Said [facility] would call her.this was at 1745 pm  03-10-18

## 2018-03-11 ENCOUNTER — Encounter: Payer: Self-pay | Admitting: Oncology

## 2018-03-11 ENCOUNTER — Telehealth: Payer: Self-pay | Admitting: Oncology

## 2018-03-11 ENCOUNTER — Inpatient Hospital Stay: Payer: Medicare Other

## 2018-03-11 ENCOUNTER — Ambulatory Visit
Admission: RE | Admit: 2018-03-11 | Discharge: 2018-03-11 | Disposition: A | Discharge status: Home / Self Care | Payer: Medicare Other | Source: Ambulatory Visit | Attending: Oncology | Admitting: Oncology

## 2018-03-11 ENCOUNTER — Ambulatory Visit: Payer: Medicare Other

## 2018-03-11 ENCOUNTER — Inpatient Hospital Stay (HOSPITAL_BASED_OUTPATIENT_CLINIC_OR_DEPARTMENT_OTHER): Payer: Medicare Other | Admitting: Oncology

## 2018-03-11 ENCOUNTER — Other Ambulatory Visit: Payer: Self-pay

## 2018-03-11 VITALS — BP 119/74 | HR 102 | Temp 97.6°F | Resp 18 | Wt 153.3 lb

## 2018-03-11 DIAGNOSIS — C3491 Malignant neoplasm of unspecified part of right bronchus or lung: Secondary | ICD-10-CM

## 2018-03-11 DIAGNOSIS — R51 Headache: Secondary | ICD-10-CM

## 2018-03-11 DIAGNOSIS — M7989 Other specified soft tissue disorders: Secondary | ICD-10-CM

## 2018-03-11 DIAGNOSIS — R221 Localized swelling, mass and lump, neck: Secondary | ICD-10-CM | POA: Diagnosis not present

## 2018-03-11 DIAGNOSIS — R519 Headache, unspecified: Secondary | ICD-10-CM

## 2018-03-11 DIAGNOSIS — N1330 Unspecified hydronephrosis: Secondary | ICD-10-CM | POA: Diagnosis not present

## 2018-03-11 DIAGNOSIS — Z5112 Encounter for antineoplastic immunotherapy: Secondary | ICD-10-CM | POA: Diagnosis not present

## 2018-03-11 DIAGNOSIS — H5710 Ocular pain, unspecified eye: Secondary | ICD-10-CM

## 2018-03-11 DIAGNOSIS — J189 Pneumonia, unspecified organism: Secondary | ICD-10-CM | POA: Diagnosis not present

## 2018-03-11 DIAGNOSIS — R0602 Shortness of breath: Secondary | ICD-10-CM | POA: Diagnosis not present

## 2018-03-11 DIAGNOSIS — R590 Localized enlarged lymph nodes: Secondary | ICD-10-CM

## 2018-03-11 DIAGNOSIS — I2699 Other pulmonary embolism without acute cor pulmonale: Secondary | ICD-10-CM

## 2018-03-11 DIAGNOSIS — N179 Acute kidney failure, unspecified: Secondary | ICD-10-CM | POA: Diagnosis not present

## 2018-03-11 DIAGNOSIS — R509 Fever, unspecified: Secondary | ICD-10-CM | POA: Diagnosis not present

## 2018-03-11 DIAGNOSIS — R05 Cough: Secondary | ICD-10-CM | POA: Diagnosis present

## 2018-03-11 DIAGNOSIS — C799 Secondary malignant neoplasm of unspecified site: Secondary | ICD-10-CM

## 2018-03-11 DIAGNOSIS — C7931 Secondary malignant neoplasm of brain: Secondary | ICD-10-CM

## 2018-03-11 DIAGNOSIS — E119 Type 2 diabetes mellitus without complications: Secondary | ICD-10-CM | POA: Diagnosis not present

## 2018-03-11 LAB — CBC WITH DIFFERENTIAL/PLATELET
Basophils Absolute: 0 10*3/uL (ref 0–0.1)
Basophils Relative: 1 %
EOS ABS: 0 10*3/uL (ref 0–0.7)
Eosinophils Relative: 0 %
HCT: 26.8 % — ABNORMAL LOW (ref 35.0–47.0)
HEMOGLOBIN: 9 g/dL — AB (ref 12.0–16.0)
LYMPHS ABS: 0.3 10*3/uL — AB (ref 1.0–3.6)
Lymphocytes Relative: 3 %
MCH: 31 pg (ref 26.0–34.0)
MCHC: 33.3 g/dL (ref 32.0–36.0)
MCV: 93 fL (ref 80.0–100.0)
Monocytes Absolute: 0.4 10*3/uL (ref 0.2–0.9)
Monocytes Relative: 5 %
NEUTROS ABS: 7.9 10*3/uL — AB (ref 1.4–6.5)
NEUTROS PCT: 91 %
Platelets: 185 10*3/uL (ref 150–440)
RBC: 2.89 MIL/uL — ABNORMAL LOW (ref 3.80–5.20)
RDW: 19.9 % — ABNORMAL HIGH (ref 11.5–14.5)
WBC: 8.7 10*3/uL (ref 3.6–11.0)

## 2018-03-11 LAB — TYPE AND SCREEN
ABO/RH(D): B POS
Antibody Screen: NEGATIVE
UNIT DIVISION: 0

## 2018-03-11 LAB — TSH: TSH: 7.046 u[IU]/mL — ABNORMAL HIGH (ref 0.350–4.500)

## 2018-03-11 LAB — COMPREHENSIVE METABOLIC PANEL
ALBUMIN: 2.3 g/dL — AB (ref 3.5–5.0)
ALK PHOS: 98 U/L (ref 38–126)
ALT: 11 U/L (ref 0–44)
AST: 11 U/L — ABNORMAL LOW (ref 15–41)
Anion gap: 7 (ref 5–15)
BUN: 24 mg/dL — AB (ref 8–23)
CALCIUM: 8.5 mg/dL — AB (ref 8.9–10.3)
CO2: 28 mmol/L (ref 22–32)
Chloride: 97 mmol/L — ABNORMAL LOW (ref 98–111)
Creatinine, Ser: 1.48 mg/dL — ABNORMAL HIGH (ref 0.44–1.00)
GFR calc Af Amer: 42 mL/min — ABNORMAL LOW (ref >60)
GFR calc non Af Amer: 36 mL/min — ABNORMAL LOW (ref >60)
GLUCOSE: 312 mg/dL — AB (ref 70–99)
Potassium: 4 mmol/L (ref 3.5–5.1)
SODIUM: 132 mmol/L — AB (ref 135–145)
Total Bilirubin: 0.7 mg/dL (ref 0.3–1.2)
Total Protein: 6.3 g/dL — ABNORMAL LOW (ref 6.5–8.1)

## 2018-03-11 LAB — BPAM RBC
Blood Product Expiration Date: 201909182359
ISSUE DATE / TIME: 201908202127
Unit Type and Rh: 7300

## 2018-03-11 MED ORDER — SENNA 8.6 MG PO TABS: 2.0000 | ORAL_TABLET | Freq: Every day | ORAL | 0 refills | Status: AC

## 2018-03-11 MED ORDER — FUROSEMIDE 20 MG PO TABS: 20.0000 mg | ORAL_TABLET | Freq: Every day | ORAL | 0 refills | Status: AC | PRN

## 2018-03-11 MED ORDER — DEXAMETHASONE 4 MG PO TABS: 4.0000 mg | ORAL_TABLET | Freq: Two times a day (BID) | ORAL | 0 refills | Status: AC

## 2018-03-11 MED ORDER — SODIUM CHLORIDE 0.9 % IV SOLN
Freq: Once | INTRAVENOUS | Status: AC
  Administered 2018-03-11: 12:00:00 via INTRAVENOUS
  Filled 2018-03-11: qty 250

## 2018-03-11 MED ORDER — SODIUM CHLORIDE 0.9 % IV SOLN
200.0000 mg | Freq: Once | INTRAVENOUS | Status: AC
  Administered 2018-03-11: 200 mg via INTRAVENOUS
  Filled 2018-03-11: qty 8

## 2018-03-11 MED ORDER — APIXABAN 5 MG PO TABS: ORAL_TABLET | ORAL | 0 refills | Status: DC

## 2018-03-11 MED ORDER — HEPARIN SOD (PORK) LOCK FLUSH 100 UNIT/ML IV SOLN
500.0000 [IU] | Freq: Once | INTRAVENOUS | Status: AC | PRN
  Administered 2018-03-11: 500 [IU]

## 2018-03-11 NOTE — Progress Notes (Signed)
Patient ID: Dana Ramirez, female   DOB: [birth year], 67 y.o.   MRN: [id number]  Yesterday evening I was called by nursing staff that the patient daughter called that they could not pick up the Eliquis from the pharmacy because it was not covered by the insurance company.  I needed to give the patient blood thinner last night and switched it over to Xarelto, which was covered by the insurance. Dr. Loletha Grayer

## 2018-03-11 NOTE — Progress Notes (Signed)
Patient here for follow up. Patient no longer taking eliquis, she starte don xarelto 15mg  last night, due to insurance. Patient having vision problems and having trouble focusing. C/o Pain to right eye and left side of cheek. Pt has been very groggy unrelated to medication, per daughter. +3 edema to feet. Per daughter, pt has been constipated but she is unsure if she should use laxatives because she is concerned about blood in the stool due  xarelto use. Requesting refill on tramadol. Pt has been having episodes of shortness of breath at home and she starts to panic, she uses morphine when this happens.

## 2018-03-11 NOTE — Progress Notes (Addendum)
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  female with PMH listed below who was referred to me for evaluation of neck mass. Patient reports having cough for about 2 to 3 months.  Started after a mild Upper respiratory infection, and becomes chronic.  Associate with throat discomfort.   Denies any hemoptysis, yellowish or greenish sputum. Nothing make cough better or worse.  She also noticed right neck lump for couple of weeks, the neck lump is tender.  Associated with right side neck,  and right side headache.  Denies any vision changes.Her appetite has decreased for the past couple of months and lost  a few pounds. Denies any night sweating, fever or chills. She is a never smoker, husband  and used to smoke at home. Denies any known family history of cancer.  Data Reviewed: 12/20/2017 CT neck soft tissue with contrast showed palpable abnormality represent a necrotic right supraclavicular lymph node measuring 2.2 cm in diameter.  This is the tip of the iceberg as there is massive necrotic lymphadenopathy in the right hilum and mediastinum, starting to be malignant.  Presumed lung primary is not demonstrated in the upper half of the chest.  # Patient's case were discussed on tumor board on 01/06/2018.  Per pathology, this is most consistent with non-small cell lung cancer, poorly differentiated carcinoma..  Discussed with Dr.Onley and  she confirmed that additional IHC stains all came  back negative for lymphoma.  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  No mutation identified for ALK, BRAF, EGFR, HER2, KRAS, MET, NTRK, RET fusion, ROS1.   # patient was seen for an second opinion by Dr. Aniceto Boss at [facility]. . Communicated with Dr. Aniceto Boss who thinks patient's presentation is most consistent with non-small cell lung cancer not  Sarcoma.  Currently awaiting pathologist at [facility] to review the slides.  Dr. Durenda Hurt recommend to continue with chemoradiation with carboplatin and Taxol with the plan to initiate immunotherapy maintenance after that.  # [facility] second opinion A. "Lymph node, right supraclavicular; excisional biopsy" (Outside case, [phone], [facility], [place], [place]. Date of procedure 12/31/17): Metastatic non-small cell carcinoma. See comment. Comment: The tumor cells are epithelioid, of high cytologic grade and poorly differentiated. Diffusely positive anti-keratin immunoreactivity is observed, supporting the diagnosis above. Accompanying reports describe negative reactivity for immunohistochemical stains to indicate squamous or adenocarcinomatous differentiation (TTF-1, Napsin A, p40)  INTERVAL HISTORY Dana Ramirez 67 y.o.  female presents for evaluation prior to starting immunotherapy.  She was recently admitted for pneumonia and CT work-up unfortunately showed disease progression.  She just recently finished concurrent chemoradiation and was planning to start on boosting radiation.  CT chest without contrast 03/01/2018 showed airspace opacity in the right lower lobe worrisome for pneumonia.  There is also new soft tissue in the medial aspect of the posterior right hemidiaphragm.  CT abdomen pelvis showed 2 peritoneal mass as well as questionable involvement of left  renal collecting system and proximal ureter.  Patient was treated with  IV antibiotics, also status post blood transfusion during admission.  Also she developed massive PE involving the main right pulmonary artery with complete occlusion of the distal right pulmonary artery and the right pulmonary artery branches.   She was started on heparin drip without bolus due to one-time small amount of blood in the stool.  No additional episodes of GI bleeding.   She was discharged yesterday originally with Eliquis starting kit.  Unfortunately her co-pay for Eliquis was very high so her anticoagulation medication was changed to Xarelto 15 mg twice daily by Dr. Leslye Peer.  Patient took 1 dose of 50 mg of Xarelto last night. And present for discussion and assessment prior to starting systemic treatment for [facility].  Patient has a PD-L 1 TPS 90%.  Patient reports feeling tired.  She also feels pain to her right eye and left side of cheek.  And also some coordination problems with her hands,  [ missed grabbing stuff using her hands]. Her shortness of breath is at baseline.  She takes morphine 5 mg every 4-6 hours as needed for dyspnea or pain. Appetite is poor  Review of Systems  Constitutional: Positive for malaise/fatigue. Negative for chills and fever.  HENT: Negative for congestion, ear discharge, ear pain, nosebleeds, sinus pain and sore throat.   Eyes: Positive for pain. Negative for double vision, photophobia, discharge and redness.  Respiratory: Positive for cough. Negative for hemoptysis, sputum production, shortness of breath and wheezing.   Cardiovascular: Negative for chest pain, palpitations, orthopnea, claudication and leg swelling.  Gastrointestinal: Negative for abdominal pain, blood in stool, constipation, diarrhea, heartburn, melena, nausea and vomiting.  Genitourinary: Negative for dysuria, flank pain, frequency and hematuria.  Musculoskeletal: Negative for back pain, myalgias and neck pain.  Skin: Negative for itching and rash.  Neurological: Negative for  dizziness and headaches.       Coordination problems with her hands.  Endo/Heme/Allergies: Negative for environmental allergies. Does not bruise/bleed easily.  Psychiatric/Behavioral: Negative for depression and hallucinations. The patient is not nervous/anxious.     MEDICAL HISTORY:  Past Medical History:  Diagnosis Date  . CAD (coronary artery disease)   . Carotid arterial disease ([place])   . Diabetes mellitus without complication ([place])   . History of vertigo   . Hyperlipidemia   . Hypertension   . Kidney stone     SURGICAL HISTORY: Past Surgical History:  Procedure Laterality Date  . BREAST CYST ASPIRATION Left 1980s  . CESAREAN SECTION     x 2  . CHOLECYSTECTOMY    . CORONARY ARTERY BYPASS GRAFT  03/2005   LIMA to the LAD, vein graft to diagonal with a negative stress test in 06/2006  . PORTACATH PLACEMENT Left 12/31/2017   Procedure: INSERTION PORT-A-CATH;  Surgeon: Robert Bellow, MD;  Location: [facility] ORS;  Service: General;  Laterality: Left;  . SENTINEL NODE BIOPSY Right 12/31/2017   Procedure: SUPRACLAVICULAR NODE BIOPSY;  Surgeon: Robert Bellow, MD;  Location: [facility] ORS;  Service: General;  Laterality: Right;  . TONSILLECTOMY      SOCIAL HISTORY: Social History   Socioeconomic History  . Marital status: Married    Spouse name: Elenore Rota   . Number of children: 2  . Years of education: Not on file  . Highest education level: Not on file  Occupational History  . Occupation: Retired     Fish farm manager: OTHER    Comment: [employer]  Social Needs  .  Financial resource strain: Not on file  . Food insecurity:    Worry: Not on file    Inability: Not on file  . Transportation needs:    Medical: Not on file    Non-medical: Not on file  Tobacco Use  . Smoking status: Never Smoker  . Smokeless tobacco: Never Used  Substance and Sexual Activity  . Alcohol use: No  . Drug use: No  . Sexual activity: Not on file  Lifestyle  . Physical activity:    Days per week: Not on  file    Minutes per session: Not on file  . Stress: Not on file  Relationships  . Social connections:    Talks on phone: Not on file    Gets together: Not on file    Attends religious service: Not on file    Active member of club or organization: Not on file    Attends meetings of clubs or organizations: Not on file    Relationship status: Not on file  . Intimate partner violence:    Fear of current or ex partner: Not on file    Emotionally abused: Not on file    Physically abused: Not on file    Forced sexual activity: Not on file  Other Topics Concern  . Not on file  Social History Narrative   Married   Gets regular exercise, once or twice every week    FAMILY HISTORY: Family History  Problem Relation Age of Onset  . Heart disease Mother   . Heart disease Father 54  . Stroke Sister 90  . Heart disease Sister   . Diabetes Brother   . Coronary artery disease Other        Strong family history of CAD and CABG  . Heart disease Maternal Grandmother   . Breast cancer Maternal Grandmother   . Heart disease Maternal Grandfather   . Heart disease Paternal Grandmother   . Breast cancer Paternal Grandmother 58  . Heart disease Paternal Grandfather     ALLERGIES:  is allergic to codeine; lisinopril; and penicillins.  MEDICATIONS:  Current Outpatient Medications  Medication Sig Dispense Refill  . acetaminophen (TYLENOL) 650 MG CR tablet Take 650 mg by mouth every 8 (eight) hours as needed for pain.    Marland Kitchen albuterol (PROVENTIL HFA;VENTOLIN HFA) 108 (90 Base) MCG/ACT inhaler Inhale 2 puffs into the lungs every 6 (six) hours as needed for wheezing or shortness of breath. 1 Inhaler 0  . chlorpheniramine-HYDROcodone (TUSSIONEX) 10-8 MG/5ML SUER Take 5 mLs by mouth every 12 (twelve) hours as needed for cough. 140 mL 0  . Cholecalciferol (VITAMIN D3) 1000 units CAPS Take 1,000 Units by mouth daily.     . cyanocobalamin 1000 MCG tablet Take 1,000 mcg by mouth daily.    Marland Kitchen  guaiFENesin-dextromethorphan (ROBITUSSIN DM) 100-10 MG/5ML syrup Take 5 mLs by mouth every 4 (four) hours as needed for cough (chest congestion). 118 mL 0  . JANUVIA 100 MG tablet TAKE ONE TABLET BY MOUTH DAILY (Patient taking differently: Take 100 mg by mouth daily. ) 90 tablet 1  . LORazepam (ATIVAN) 0.5 MG tablet Take 1 tablet (0.5 mg total) by mouth every 8 (eight) hours as needed for anxiety (nausea). 30 tablet 0  . metFORMIN (GLUCOPHAGE) 1000 MG tablet TAKE ONE TABLET BY MOUTH DAILY (Patient taking differently: Take 1,000 mg by mouth every evening. ) 90 tablet 1  . metoprolol succinate (TOPROL-XL) 25 MG 24 hr tablet TAKE 1 TABLET DAILY (Patient taking differently:  Take 25 mg by mouth daily. ) 90 tablet 3  . Morphine Sulfate (MORPHINE CONCENTRATE) 10 MG/0.5ML SOLN concentrated solution Take 0.25 mLs (5 mg total) by mouth every 4 (four) hours as needed for up to 7 days for moderate pain or severe pain. 180 mL 0  . omeprazole (PRILOSEC OTC) 20 MG tablet Take 20 mg by mouth daily.    . predniSONE (DELTASONE) 5 MG tablet Take 1 tablet (5 mg total) by mouth daily with breakfast. 7 tablet 0  . rosuvastatin (CRESTOR) 20 MG tablet TAKE ONE TABLET BY MOUTH DAILY (Patient taking differently: Take 20 mg by mouth at bedtime. ) 90 tablet 1  . cyclobenzaprine (FLEXERIL) 10 MG tablet Take 1 tablet (10 mg total) by mouth 3 (three) times daily as needed for muscle spasms. (Patient not taking: Reported on 03/01/2018) 30 tablet 0  . furosemide (LASIX) 20 MG tablet Take 1 tablet (20 mg total) by mouth daily as needed for fluid or edema. 30 tablet 0  . senna (SENOKOT) 8.6 MG TABS tablet Take 2 tablets (17.2 mg total) by mouth daily. 120 each 0  . silver sulfADIAZINE (SILVADENE) 1 % cream Apply 1 application topically 2 (two) times daily. (Patient not taking: Reported on 03/02/2018) 50 g 2   No current facility-administered medications for this visit.      PHYSICAL EXAMINATION: ECOG PERFORMANCE STATUS: 1 -  Symptomatic but completely ambulatory Vitals:   03/11/18 1018  BP: 119/74  Pulse: (!) 102  Resp: 18  Temp: 97.6 F (36.4 C)   Filed Weights   03/11/18 1018  Weight: 153 lb 4.8 oz (69.5 kg)    Physical Exam  Constitutional: She is oriented to person, place, and time. No distress.  Frail elderly female sitting in a wheelchair.  HENT:  Head: Normocephalic and atraumatic.  Mouth/Throat: Oropharynx is clear and moist.  Eyes: Pupils are equal, round, and reactive to light. EOM are normal. No scleral icterus.  Neck: Normal range of motion. Neck supple.  Right supra clavicular lymphadenopathy,Status post excisional biopsy.  Cardiovascular: Normal heart sounds.  Tachycardia heart rate 102  Pulmonary/Chest: Effort normal. No respiratory distress. She has no wheezes.  Right lung significantly diminished breath sounds   Abdominal: Soft. She exhibits no distension and no mass. There is no tenderness.  Musculoskeletal: Normal range of motion. She exhibits no edema or deformity.  Neurological: She is oriented to person, place, and time. Coordination abnormal.  Skin: Skin is warm and dry.  Psychiatric: She has a normal mood and affect.     LABORATORY DATA:  I have reviewed the data as listed Lab Results  Component Value Date   WBC 8.7 03/11/2018   HGB 9.0 (L) 03/11/2018   HCT 26.8 (L) 03/11/2018   MCV 93.0 03/11/2018   PLT 185 03/11/2018   Recent Labs    02/09/18 0804 03/01/18 0915  03/09/18 0532 03/10/18 0515 03/11/18 0948  NA 138 131*   < > 135 132* 132*  K 3.6 3.8   < > 4.4 3.9 4.0  CL 101 96*   < > 102 97* 97*  CO2 26 22   < > _0 GLUCOSE 217* 321*   < > 226* 237* 312*  BUN 25* 24*   < > 21 23 24*  CREATININE 0.68 1.13*   < > 1.52* 1.48* 1.48*  CALCIUM 9.5 9.2   < > 8.2* 8.6* 8.5*  GFRNONAA >60 50*   < > 35* 36* 36*  GFRAA >60  58*   < > 40* 42* 42*  PROT 6.5 6.7  --   --   --  6.3*  ALBUMIN 3.2* 3.3*  --   --   --  2.3*  AST 21 19  --   --   --  11*  ALT 20  17  --   --   --  11  ALKPHOS 69 87  --   --   --  98  BILITOT 0.5 0.5  --   --   --  0.7   < > = values in this interval not displayed.       ASSESSMENT & PLAN:  1. Malignant neoplasm of right lung, unspecified part of lung ([place])   2. Nonintractable headache, unspecified chronicity pattern, unspecified headache type   3. Mediastinal lymphadenopathy   4. Encounter for antineoplastic immunotherapy   5. Metastatic carcinoma ([place])   6. Other acute pulmonary embolism without acute cor pulmonale (H[facility])   #Aggressive lung neoplasm, clinically consistent with NSCLC, unfortunately disease progression in the middle of concurrent chemoradiation treatment. Now stage IV disease.  Performance status poor. Images were independently reviewed by me and I discussed in detail with patient, her husband, and her daughter. Prognosis is very poor.  I discussed the option of focusing on comfort care at this point or give a try for immunotherapy with single agent Keytruda as she has very high PDL 1 TPS being 90%.  Patient and her family members made aware that immunotherapy probably has 45% chance of treatment response however it takes time to work and also associated with side effects.  If her disease progresses very fast, her life span is very short not allowing immunotherapy to exert for effect. Patient and her family express wish of trying immunotherapy.   I discussed the mechanism of action and rationale of using immunotherapy.  The goal of therapy is palliative; and length of treatments are likely ongoing/based upon the results of the scans. Discussed the potential side effects of immunotherapy including but not limited to diarrhea; skin rash; respiratory failure, neurotoxicity, elevated LFTs/endocrine abnormalities etc. patient and the family members voiced understanding and agree with proceeding treatment today.   #New coordination symptoms and eye pain, I will order a stat CT brain for assessment of CNS  metastasis/bleeding. #Massive PE involving main right pulmonary artery: Large thrombus burden. She was supposed to take Eliquis however due to the co-pay issue, her prescription was changed by hospitalist team to Xarelto 15 mg twice daily.  Patient had taken 1 tablet of Xarelto 50 mg last night.   Given that patient has impaired renal function, I recommend patient to be switched on Eliquis.  I have talked with pharmacist at the cancer center who helped coordinating patient to receive discounted price Eliquis.  Her co-pay will be $38.5 for 1 months.  Prescription was sent to pharmacy.  I advised patient to wait until her CT scan was resulted and CNS bleeding was ruled out before she takes any anticoagulation today.  #Bilateral chronic leg swelling, got worse since discharge.  She had US venous lower extremity bilateral several days ago during admission, negative for clots.  She took Lasix during admission which helped with her lower extremities swelling.  We will send Lasix 20 mg as needed for lower extremity edema.  #Addendum Discussed with radiologist who read the patient's CT brain. 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 Image was independently reviewed and agree with radiology 12 mm nonhemorrhagic metastasis with surrounding  edema at the right frontal vertex 15 mm hemorrhagic metastasis with surrounding edema in the left forceps major region Right occipital lobe, 3.2 x 1.5 x 2.1 cm hemorrhagic metastasis with surrounding edema Right posterior temporal lobe 4.1 x 2.8 x 2.4 cm hemorrhagic metastasis with surrounding edema.  By the time I received these results, patient has already returned home.  So I caught patient and talk to her daughter Margreta Journey.  Patient feels very tired and cannot speak over the phone. 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 This is an extremely difficult situation as she has both high risk of  thrombosis risk if anticoagulation being hold as well as extremely high intracranial bleeding risk if anticoagulation being continued.  Her prognosis is very poor and her disease progressively rapidly.  She received 1 dose of pembrolizumab today.  I called Dr. Durenda Hurt who evaluate patient for second opinion couple weeks ago.  Dr. Durenda Hurt agrees that this is a super challenging situation and if patient desires treatment, he recommends patient to go to [facility] and Dr. Durenda Hurt will admit patient to neurology ICU with neurosurgery consult and hematology consult, hopefully with these resources patient can try some anticoagulation.  I also discussed with daughter about goals of care and option of transition to hospice care to focus on comfort care. I discussed both options with the daughter and daughter says she is going to discuss with her mom and other family members and she will call me back today. .   Return of visit: to be determined.  Total face to face encounter time for this patient visit was 75 min. >50% of the time was  spent in counseling and coordination of care.   Earlie Server, MD, PhD Hematology [place] [facility] [place]- [phone] 03/11/2018   5:45pm Addendum:  Patient's daughter and son called me back and informed me that after they discussed with patient and her other family members, patient has decided not to proceed with anticoagulation and wants to go for hospice.  I will send Dexamethasone 11m BID Rx to pharmacy to reduce CNS edema.  Will call Hospice. I called LMagda PaganiniHerring's phone and left a message.   ZEarlie Server

## 2018-03-11 NOTE — Addendum Note (Signed)
Addended by: Earlie Server on: 03/11/2018 05:58 PM   Modules accepted: Orders

## 2018-03-11 NOTE — Telephone Encounter (Signed)
Per Pete Pelt, Hospice nurse Alyse Low called to request verbal order for palliative care for patient. Dr. Tasia Catchings in agreement with order. I called and gave verbal order to triage nurse, Crystal.

## 2018-03-14 ENCOUNTER — Inpatient Hospital Stay: Payer: Medicare Other

## 2018-03-14 ENCOUNTER — Ambulatory Visit: Admission: RE | Admit: 2018-03-14 | Payer: Medicare Other | Source: Ambulatory Visit

## 2018-03-14 ENCOUNTER — Telehealth: Payer: Self-pay | Admitting: *Deleted

## 2018-03-14 ENCOUNTER — Ambulatory Visit: Payer: Medicare Other

## 2018-03-14 ENCOUNTER — Inpatient Hospital Stay: Payer: Medicare Other | Admitting: Nurse Practitioner

## 2018-03-14 NOTE — Telephone Encounter (Signed)
Please help her to arrange oxygen, duoneb equipment.  She can use Lasix 20mg  daily PRN for lower extremity swelling. Thanks.

## 2018-03-14 NOTE — Telephone Encounter (Signed)
Patient opened to hospice services over the weekend and nurse is asking for orders for O2 as her sats are not stable, and Duo neb, she would also like to discuss her lasix dosing

## 2018-03-14 NOTE — Telephone Encounter (Signed)
I spoke with [facility] and gave her Dr Collie Siad orders which she repeated back to me

## 2018-03-15 ENCOUNTER — Ambulatory Visit: Payer: Medicare Other

## 2018-03-16 ENCOUNTER — Other Ambulatory Visit: Payer: Medicare Other

## 2018-03-16 ENCOUNTER — Other Ambulatory Visit: Payer: Self-pay

## 2018-03-16 ENCOUNTER — Ambulatory Visit: Payer: Medicare Other | Admitting: Oncology

## 2018-03-16 ENCOUNTER — Telehealth: Payer: Self-pay | Admitting: *Deleted

## 2018-03-16 ENCOUNTER — Ambulatory Visit: Payer: Medicare Other

## 2018-03-16 NOTE — Telephone Encounter (Signed)
Hospice nurse Sena Slate called to report that she got comfort care orders from Dr Gilford Rile (medical director) due to not having signed orders back and patient having severe terminal restlessness

## 2018-03-16 NOTE — Telephone Encounter (Signed)
LMTCB. Need to find out if pt is taking pantoprazole(protonix) or omeprazole. We received a refill request for pantoprazole but is looks like it was discontinued due to patient preference.

## 2018-03-16 NOTE — Telephone Encounter (Signed)
Hospice nurse called to request refill of patient Roxanol, but while on the phone with her the patient expired so refill not needed

## 2018-03-17 ENCOUNTER — Other Ambulatory Visit: Payer: Self-pay | Admitting: Internal Medicine

## 2018-03-17 ENCOUNTER — Ambulatory Visit: Payer: Medicare Other

## 2018-03-18 ENCOUNTER — Ambulatory Visit: Payer: Medicare Other

## 2018-03-20 DEATH — deceased

## 2018-03-22 ENCOUNTER — Ambulatory Visit: Payer: Medicare Other

## 2018-03-23 ENCOUNTER — Ambulatory Visit: Payer: Medicare Other

## 2018-03-24 ENCOUNTER — Ambulatory Visit: Payer: Medicare Other

## 2018-03-24 ENCOUNTER — Inpatient Hospital Stay: Payer: Medicare Other | Admitting: Internal Medicine

## 2018-03-25 ENCOUNTER — Ambulatory Visit: Payer: BC Managed Care – PPO | Admitting: Urology

## 2018-03-25 ENCOUNTER — Ambulatory Visit: Payer: Medicare Other

## 2018-04-11 ENCOUNTER — Ambulatory Visit: Payer: Medicare Other | Admitting: Cardiovascular Disease

## 2018-08-01 ENCOUNTER — Ambulatory Visit: Payer: Medicare Other | Admitting: Internal Medicine

## 2019-05-24 IMAGING — CT CT HEAD W/O CM
3 series · 15 of 47 positions shown, 18 images · non-contrast
Comparison: MRI 12/31/2017

CLINICAL DATA: Vision disturbance. Right eye pain. History of lung
cancer.

EXAM:
CT HEAD WITHOUT CONTRAST
TECHNIQUE: Contiguous axial images were obtained from the base of the skull
through the vertex without intravenous contrast.

[Series 2: head wo · axial · 0.41mm/px · z∈[-114,+11]mm · 9 of 30 slices shown, 12 images]
[im 3/30  brain]
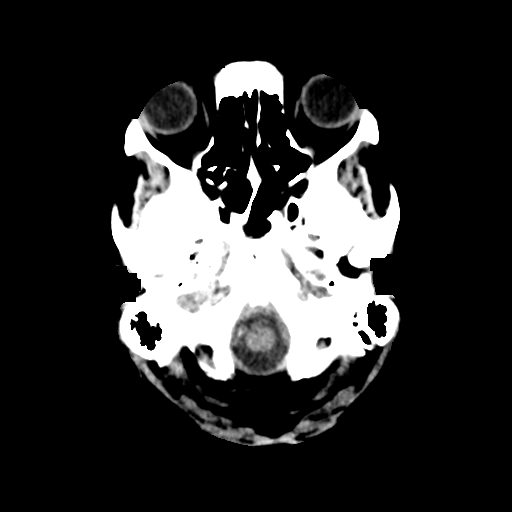
[im 3/30  bone]
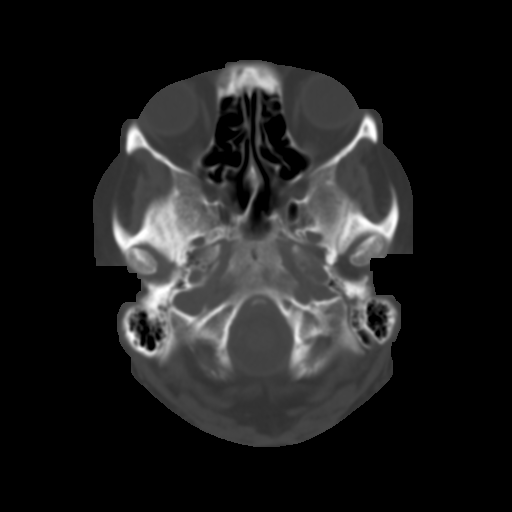
[im 6/30  brain]
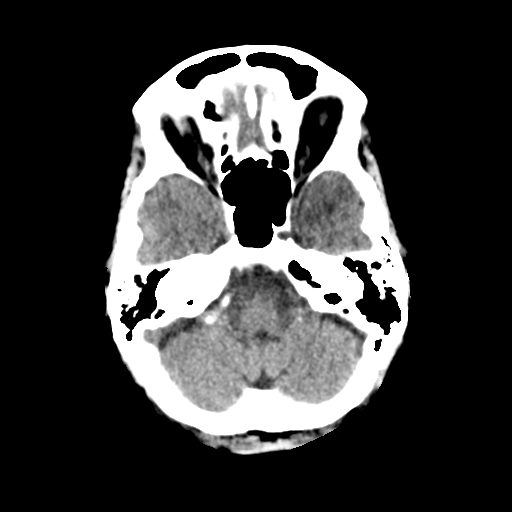
[im 9/30  brain]
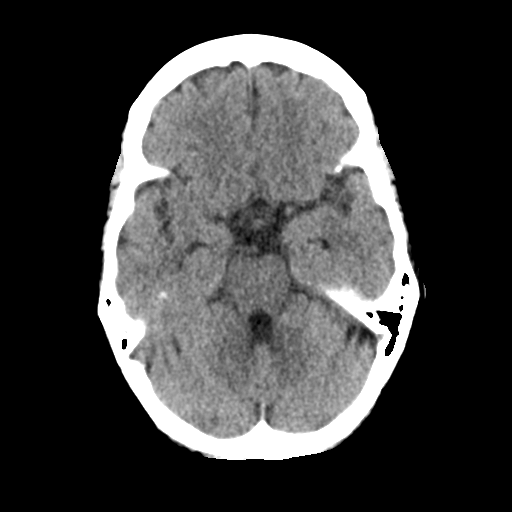
[im 12/30  brain]
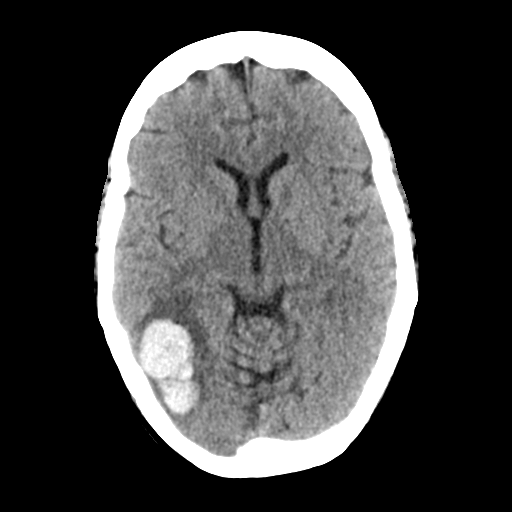
[im 16/30  brain]
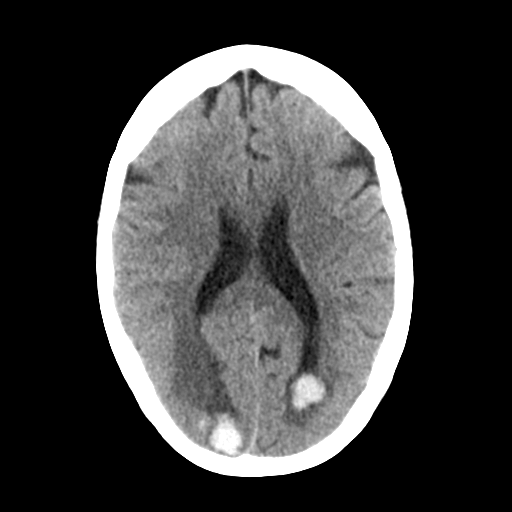
[im 16/30  bone]
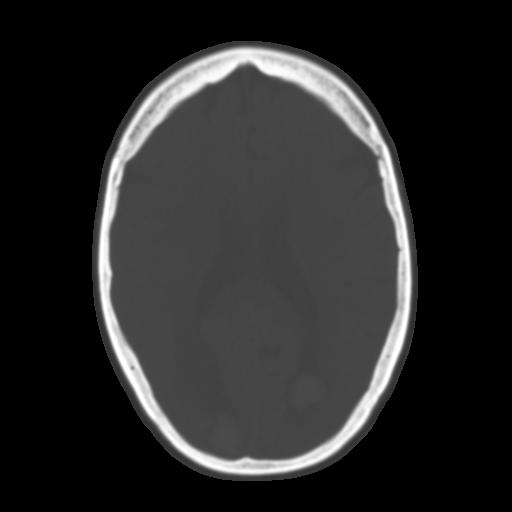
[im 19/30  brain]
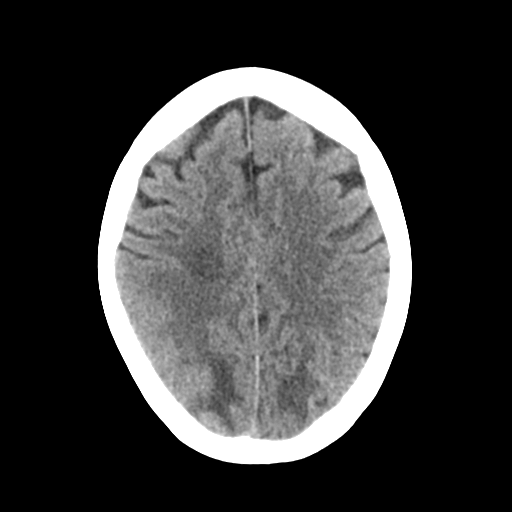
[im 22/30  brain]
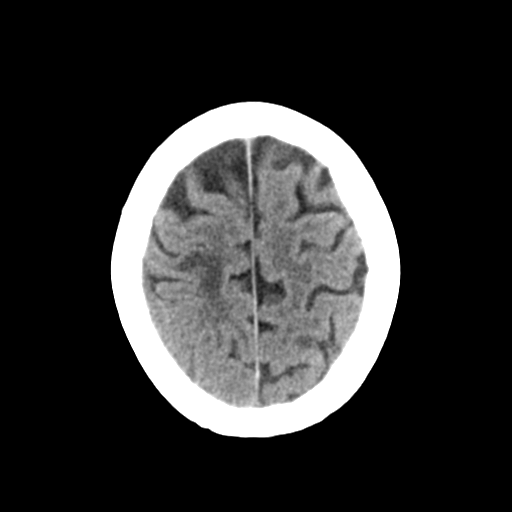
[im 25/30  brain]
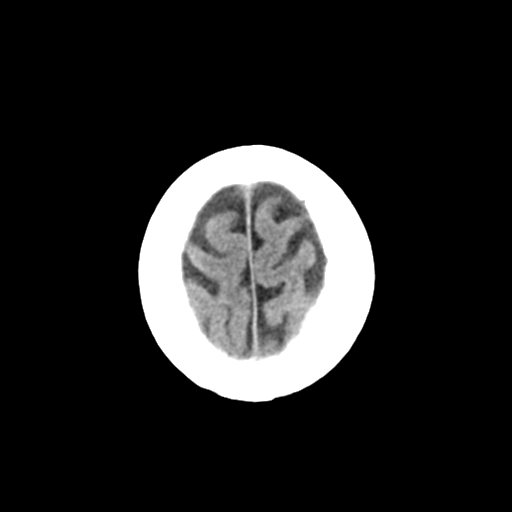
[im 28/30  brain]
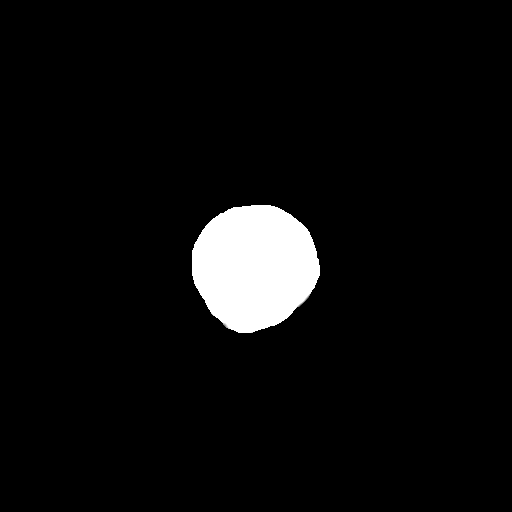
[im 28/30  bone]
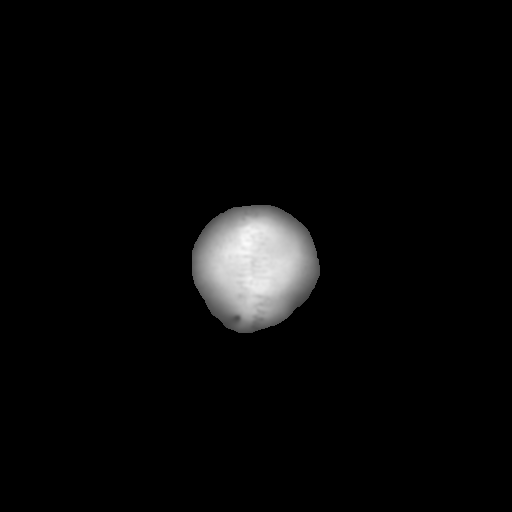

[Series 4: coronal soft tissue · coronal · 0.30mm/px · 3 of 59 slices shown]
[im 20/59  brain]
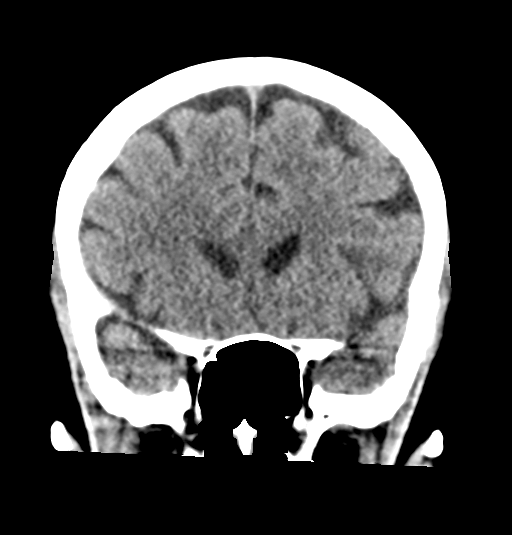
[im 26/59  brain]
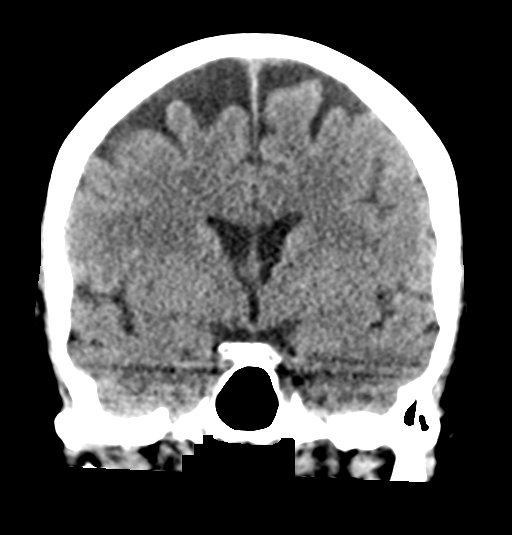
[im 33/59  brain]
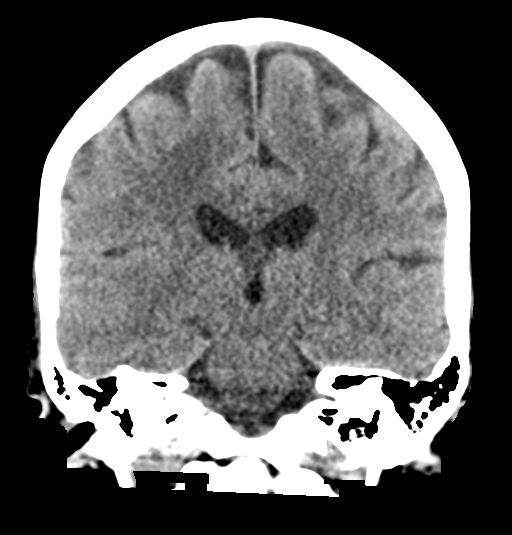

[Series 5: sagittal soft tissue · sagittal · 0.32mm/px · 3 of 47 slices shown]
[im 16/47  brain]
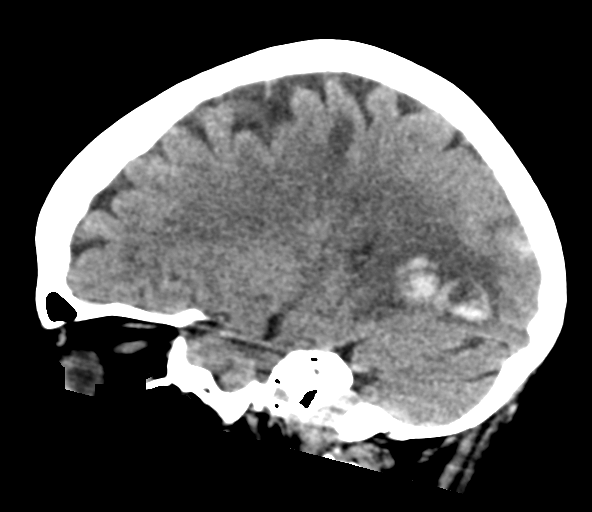
[im 24/47  brain]
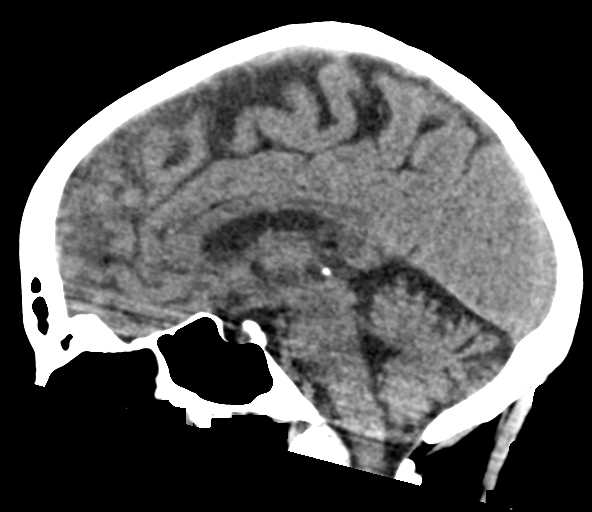
[im 31/47  brain]
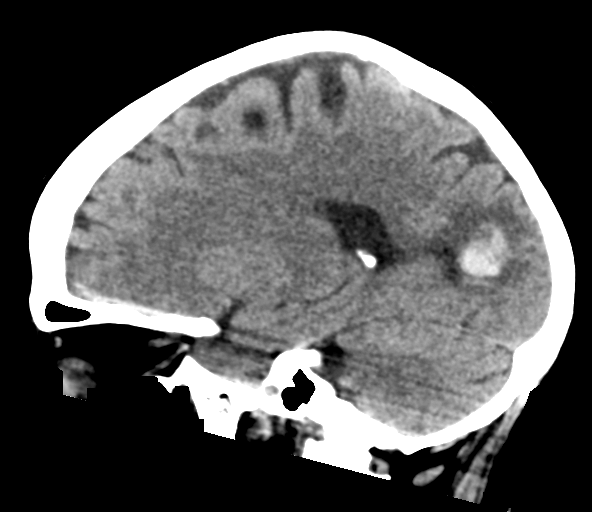

[15 of 47 positions shown; findings below may reference images not displayed]

FINDINGS: Brain: The brainstem and cerebellum are normal. There are 3
intraparenchymal hemorrhages in the posterior supratentorial brain.
On the left, in the white matter of the forceps major, there is a 15
mm in diameter hemorrhage with surrounding edema. On the right, in
the occipital cortical and subcortical brain there is a 3.2 x 1.5 x
2.1 cm hemorrhage with surrounding edema. In the right lateral
posterior temporal lobe, there is a 4.1 x 2.8 x 2.4 cm hemorrhage
with surrounding edema. In this setting, these are likely to relate
to hemorrhagic metastatic disease. Additionally, there is a
nonhemorrhagic lesion at the right frontal vertex measuring 12 mm in
diameter with surrounding edema. No hydrocephalus. No midline shift.
No extra-axial collection.

Vascular: No abnormal vascular finding.

Skull: Negative

Sinuses/Orbits: Clear/normal

Other: None
IMPRESSION: At the right frontal vertex, there is a 12 mm non hemorrhagic
metastasis with surrounding edema.

In the left forceps major region, there is a 15 mm in diameter
hemorrhagic metastasis with surrounding edema.

In the right occipital lobe, there is a 3.2 x 1.5 x 2.1 cm
hemorrhagic metastasis with surrounding edema.

In the right posterior temporal lobe, there is a 4.1 x 2.8 x 2.4 cm
hemorrhagic metastasis with surrounding edema.

Call report of this examination is in progress.
